# Patient Record
Sex: Female | Born: 1950 | Race: White | Hispanic: No | Marital: Married | Smoking: Former smoker
Health system: Southern US, Community
[De-identification: ages and names within clinical notes are randomized; demographics above are authoritative.]

## PROBLEM LIST (undated history)

## (undated) DIAGNOSIS — N189 Chronic kidney disease, unspecified: Secondary | ICD-10-CM

## (undated) DIAGNOSIS — M8580 Other specified disorders of bone density and structure, unspecified site: Secondary | ICD-10-CM

## (undated) DIAGNOSIS — E7800 Pure hypercholesterolemia, unspecified: Secondary | ICD-10-CM

## (undated) DIAGNOSIS — M1710 Unilateral primary osteoarthritis, unspecified knee: Secondary | ICD-10-CM

## (undated) DIAGNOSIS — L409 Psoriasis, unspecified: Secondary | ICD-10-CM

## (undated) DIAGNOSIS — F419 Anxiety disorder, unspecified: Principal | ICD-10-CM

## (undated) DIAGNOSIS — I10 Essential (primary) hypertension: Secondary | ICD-10-CM

## (undated) DIAGNOSIS — M1990 Unspecified osteoarthritis, unspecified site: Secondary | ICD-10-CM

## (undated) HISTORY — DX: Unilateral primary osteoarthritis, unspecified knee: M17.10

## (undated) HISTORY — DX: Essential (primary) hypertension: I10

## (undated) HISTORY — DX: Other specified disorders of bone density and structure, unspecified site: M85.80

## (undated) HISTORY — DX: Unspecified osteoarthritis, unspecified site: M19.90

## (undated) HISTORY — DX: Chronic kidney disease, unspecified: N18.9

## (undated) HISTORY — DX: Anxiety disorder, unspecified: F41.9

## (undated) HISTORY — DX: Pure hypercholesterolemia, unspecified: E78.00

## (undated) HISTORY — DX: Psoriasis, unspecified: L40.9

---

## 1983-03-11 HISTORY — PX: BREAST SURGERY: SHX581

## 1984-03-10 HISTORY — PX: BREAST EXCISIONAL BIOPSY: SUR124

## 1991-03-11 HISTORY — PX: TUBAL LIGATION: SHX77

## 1997-07-07 ENCOUNTER — Other Ambulatory Visit: Admission: RE | Admit: 1997-07-07 | Discharge: 1997-07-07 | Payer: Self-pay | Admitting: Obstetrics and Gynecology

## 1998-05-23 ENCOUNTER — Other Ambulatory Visit: Admission: RE | Admit: 1998-05-23 | Discharge: 1998-05-23 | Payer: Self-pay | Admitting: Internal Medicine

## 1999-04-18 ENCOUNTER — Encounter: Payer: Self-pay | Admitting: Internal Medicine

## 1999-04-18 ENCOUNTER — Encounter: Admission: RE | Admit: 1999-04-18 | Discharge: 1999-04-18 | Payer: Self-pay | Admitting: Internal Medicine

## 1999-12-06 ENCOUNTER — Other Ambulatory Visit: Admission: RE | Admit: 1999-12-06 | Discharge: 1999-12-06 | Payer: Self-pay | Admitting: Obstetrics and Gynecology

## 2000-11-17 ENCOUNTER — Encounter: Payer: Self-pay | Admitting: Internal Medicine

## 2000-11-17 ENCOUNTER — Encounter: Admission: RE | Admit: 2000-11-17 | Discharge: 2000-11-17 | Payer: Self-pay | Admitting: Internal Medicine

## 2000-12-08 ENCOUNTER — Other Ambulatory Visit: Admission: RE | Admit: 2000-12-08 | Discharge: 2000-12-08 | Payer: Self-pay | Admitting: Obstetrics and Gynecology

## 2002-02-01 ENCOUNTER — Encounter: Admission: RE | Admit: 2002-02-01 | Discharge: 2002-02-01 | Payer: Self-pay | Admitting: Family Medicine

## 2002-02-01 ENCOUNTER — Encounter: Payer: Self-pay | Admitting: Family Medicine

## 2002-03-10 HISTORY — PX: LEG SURGERY: SHX1003

## 2002-07-26 ENCOUNTER — Inpatient Hospital Stay (HOSPITAL_COMMUNITY): Admission: AD | Admit: 2002-07-26 | Discharge: 2002-07-28 | Payer: Self-pay | Admitting: Emergency Medicine

## 2002-07-26 ENCOUNTER — Encounter: Payer: Self-pay | Admitting: Emergency Medicine

## 2002-07-26 ENCOUNTER — Encounter: Payer: Self-pay | Admitting: Orthopedic Surgery

## 2002-08-31 ENCOUNTER — Ambulatory Visit (HOSPITAL_COMMUNITY): Admission: RE | Admit: 2002-08-31 | Discharge: 2002-08-31 | Payer: Self-pay | Admitting: Orthopedic Surgery

## 2003-04-04 ENCOUNTER — Other Ambulatory Visit: Admission: RE | Admit: 2003-04-04 | Discharge: 2003-04-04 | Payer: Self-pay | Admitting: Family Medicine

## 2003-05-09 ENCOUNTER — Encounter: Admission: RE | Admit: 2003-05-09 | Discharge: 2003-05-09 | Payer: Self-pay | Admitting: Family Medicine

## 2003-07-14 ENCOUNTER — Ambulatory Visit (HOSPITAL_COMMUNITY): Admission: RE | Admit: 2003-07-14 | Discharge: 2003-07-14 | Payer: Self-pay | Admitting: Orthopedic Surgery

## 2003-07-14 ENCOUNTER — Ambulatory Visit (HOSPITAL_BASED_OUTPATIENT_CLINIC_OR_DEPARTMENT_OTHER): Admission: RE | Admit: 2003-07-14 | Discharge: 2003-07-14 | Payer: Self-pay | Admitting: Orthopedic Surgery

## 2003-11-27 ENCOUNTER — Ambulatory Visit (HOSPITAL_BASED_OUTPATIENT_CLINIC_OR_DEPARTMENT_OTHER): Admission: RE | Admit: 2003-11-27 | Discharge: 2003-11-27 | Payer: Self-pay | Admitting: Orthopedic Surgery

## 2004-05-01 ENCOUNTER — Other Ambulatory Visit: Admission: RE | Admit: 2004-05-01 | Discharge: 2004-05-01 | Payer: Self-pay | Admitting: Family Medicine

## 2004-07-23 ENCOUNTER — Encounter: Admission: RE | Admit: 2004-07-23 | Discharge: 2004-07-23 | Payer: Self-pay | Admitting: Family Medicine

## 2005-05-09 ENCOUNTER — Other Ambulatory Visit: Admission: RE | Admit: 2005-05-09 | Discharge: 2005-05-09 | Payer: Self-pay | Admitting: Family Medicine

## 2005-08-19 ENCOUNTER — Encounter: Admission: RE | Admit: 2005-08-19 | Discharge: 2005-08-19 | Payer: Self-pay | Admitting: Family Medicine

## 2006-06-23 ENCOUNTER — Other Ambulatory Visit: Admission: RE | Admit: 2006-06-23 | Discharge: 2006-06-23 | Payer: Self-pay | Admitting: Family Medicine

## 2006-10-29 ENCOUNTER — Encounter: Admission: RE | Admit: 2006-10-29 | Discharge: 2006-10-29 | Payer: Self-pay | Admitting: Family Medicine

## 2007-06-29 ENCOUNTER — Other Ambulatory Visit: Admission: RE | Admit: 2007-06-29 | Discharge: 2007-06-29 | Payer: Self-pay | Admitting: Family Medicine

## 2007-10-29 ENCOUNTER — Ambulatory Visit (HOSPITAL_COMMUNITY): Admission: RE | Admit: 2007-10-29 | Discharge: 2007-10-30 | Payer: Self-pay | Admitting: Orthopedic Surgery

## 2007-11-30 ENCOUNTER — Encounter: Admission: RE | Admit: 2007-11-30 | Discharge: 2007-11-30 | Payer: Self-pay | Admitting: Family Medicine

## 2008-07-11 ENCOUNTER — Other Ambulatory Visit: Admission: RE | Admit: 2008-07-11 | Discharge: 2008-07-11 | Payer: Self-pay | Admitting: Family Medicine

## 2008-11-30 ENCOUNTER — Encounter: Admission: RE | Admit: 2008-11-30 | Discharge: 2008-11-30 | Payer: Self-pay | Admitting: Family Medicine

## 2009-07-13 ENCOUNTER — Other Ambulatory Visit: Admission: RE | Admit: 2009-07-13 | Discharge: 2009-07-13 | Payer: Self-pay | Admitting: Family Medicine

## 2009-12-03 ENCOUNTER — Encounter: Admission: RE | Admit: 2009-12-03 | Discharge: 2009-12-03 | Payer: Self-pay | Admitting: Family Medicine

## 2010-04-26 IMAGING — RF DG TIBIA/FIBULA 2V*L*
1 series · 3 of 3 positions shown · non-contrast
Comparison: None available.

CLINICAL DATA: Removal of hardware from the left tibia.

LEFT TIBIA AND FIBULA - 2 VIEW

[Series 1: run · 3 of 3 slices shown]
[im 1/3]
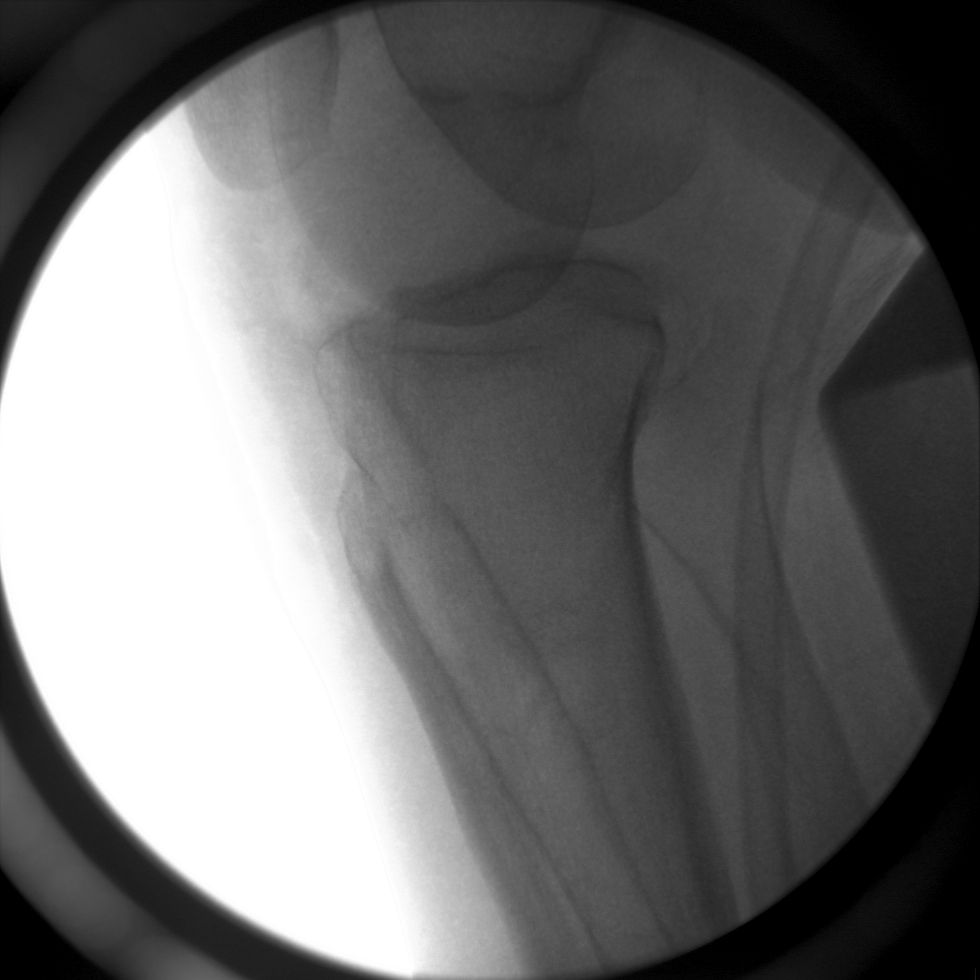
[im 2/3]
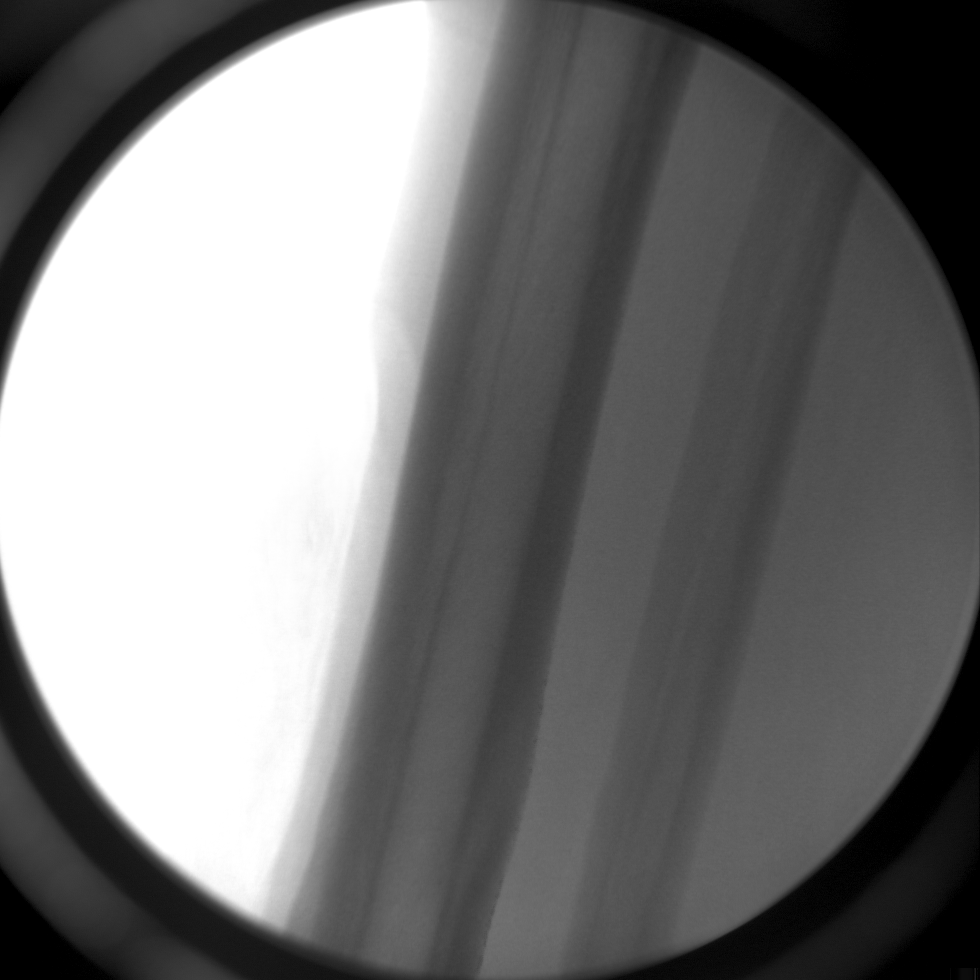
[im 3/3]
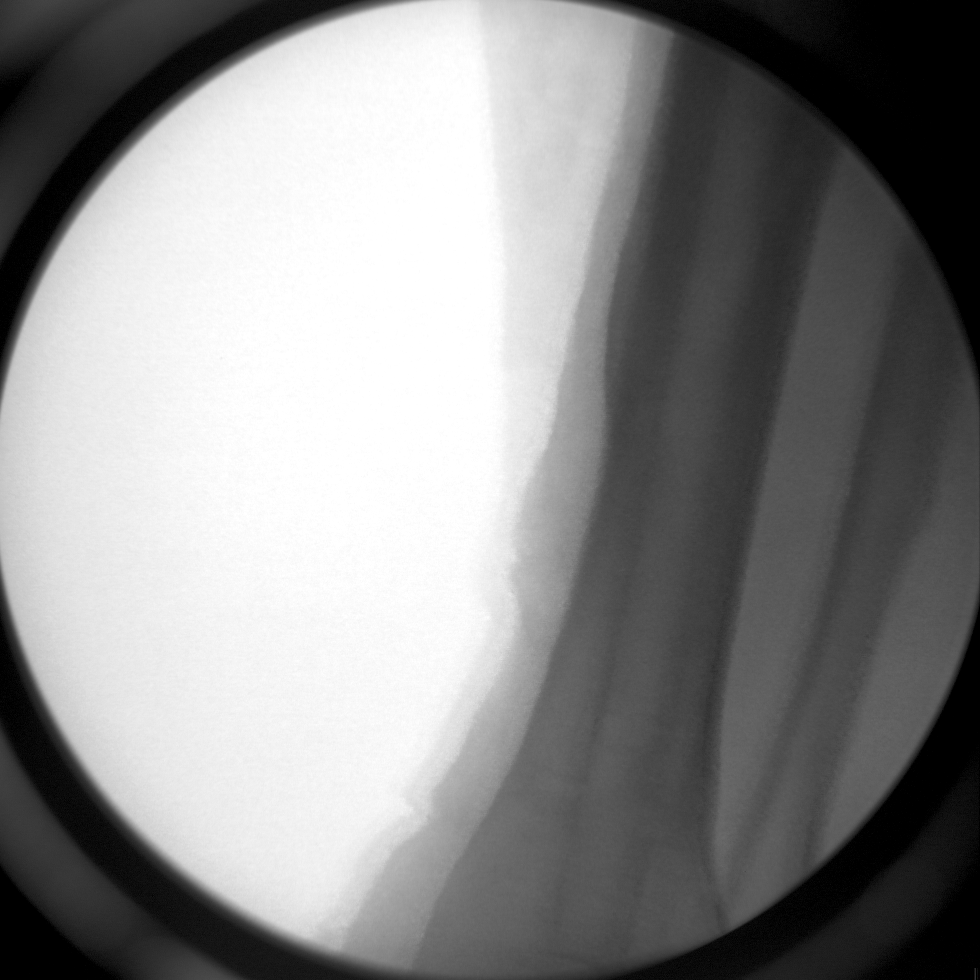

[3 of 3 positions shown; findings below may reference images not displayed]

FINDINGS: We are provided with three fluoroscopic intraoperative
spot views of the left tibia.  Tract from IM nail is noted.  No
retained hardware.  No fracture.
IMPRESSION: Status post removal of IM nail in the tibia.  No complicating
features.

## 2010-06-17 ENCOUNTER — Other Ambulatory Visit: Payer: Self-pay | Admitting: Family Medicine

## 2010-06-17 DIAGNOSIS — R1011 Right upper quadrant pain: Secondary | ICD-10-CM

## 2010-06-20 ENCOUNTER — Ambulatory Visit
Admission: RE | Admit: 2010-06-20 | Discharge: 2010-06-20 | Disposition: A | Discharge status: Home / Self Care | Payer: Federal, State, Local not specified - PPO | Source: Ambulatory Visit | Attending: Family Medicine | Admitting: Family Medicine

## 2010-06-20 DIAGNOSIS — R1011 Right upper quadrant pain: Secondary | ICD-10-CM

## 2010-06-24 ENCOUNTER — Other Ambulatory Visit: Payer: Self-pay | Admitting: Gastroenterology

## 2010-07-23 NOTE — Op Note (Signed)
NAMEMARITHZA, Morgan Jensen                ACCOUNT NO.:  [id number]   MEDICAL RECORD NO.:  [id number]          PATIENT TYPE:  AMB   LOCATION:  SDS                          FACILITY:  [facility]   PHYSICIAN:  Dyke Brackett, M.D.    DATE OF BIRTH:  1950/04/14   DATE OF PROCEDURE:  10/29/2007  DATE OF DISCHARGE:                               OPERATIVE REPORT   INDICATIONS:  This is a 60 year old with IM rod done many years.  It had  gone uneventful healing for severe open fracture, persistent achiness to  the leg biopsy.  I advised to her that this may or may not be due to the  rod.  She understood that this may or may not help her pain, but there  is no evidence of any other problem.  I thought this could be  accomplished as an outpatient.   PREOPERATIVE DIAGNOSIS:  Retained painful hardware of left leg.   POSTOPERATIVE DIAGNOSIS:  Retained painful hardware of left leg.   OPERATION:  Removal of IM rod, left leg.   SURGEON:  Dyke Brackett, MD   ANESTHESIA:  General.   TOURNIQUET TIME:  Approximately 30 minutes.   DESCRIPTION OF PROCEDURE:  After sterile prep and drape, exsanguination,  and tourniquet placed at 350, the old skin incision was entered.  Patellar tendon was carefully protected and retracted.  Using a guide  pin, the canal around rod was entered and then the in-cap on the rod  removed.  After that, a conical bolt was attached for extraction of the  rod which was removed with a slap hammer.  X-rays confirmed the tibia  was intact after rod removal.  The wound was irrigated and closed with  0, 2-0 Vicryl, and skin clips, Marcaine without epinephrine, light  compressive sterile dressing, and taken to recovery room stable  condition.      Dyke Brackett, M.D.  Electronically Signed     WDC/MEDQ  D:  10/29/2007  T:  10/30/2007  Job:  [id number]

## 2010-07-26 NOTE — Op Note (Signed)
Morgan Jensen, Morgan Jensen                          ACCOUNT NO.:  [id number]   MEDICAL RECORD NO.:  [id number]                   PATIENT TYPE:  AMB   LOCATION:  DSC                                  FACILITY:  [facility]   PHYSICIAN:  Robert A. Thurston Hole, M.D.              DATE OF BIRTH:  1950-11-26   DATE OF PROCEDURE:  11/27/2003  DATE OF DISCHARGE:                                 OPERATIVE REPORT   PREOPERATIVE DIAGNOSIS:  Left carpal tunnel syndrome.   POSTOPERATIVE DIAGNOSIS:  Left carpal tunnel syndrome.   PROCEDURE:  Left carpal tunnel release.   SURGEON:  Elana Alm. Thurston Hole, M.D.   ASSISTANT:  Cecil Cranker, P.A.   ANESTHESIA:  General.   OPERATIVE TIME:  Was 30 minutes.   COMPLICATIONS:  None.   INDICATIONS FOR PROCEDURE:  The patient is a 60 year old woman who has had  longstanding bilateral carpal tunnel syndrome, left worse than right,  documented by EMG and NCV's, who has failed conservative care.  She is now  to undergo a carpal tunnel release.   DESCRIPTION OF PROCEDURE:  The patient is brought to the operating room on  November 27, 2003, and placed on the operating room table in the supine  position.  After an adequate level of general anesthesia was obtained, her  left hand was prepped using sterile Duraprep and draped using a sterile  technique.  The arm and hand were exsanguinated and a forearm tourniquet  elevated to 250 mmHg.  Initially through a 4 cm palmar incision the initial  exposure was made.  The underlying subcutaneous tissues were incised in line  with the skin incision.  The transverse carpal ligament was exposed at the  level of the wrist flexion crease, carefully protecting the underlying  median nerve.  The entire transverse carpal tunnel ligament was released  distally to the level of the superficial palmar arch, carefully protecting  this, and released proximally approximately three inches proximal to the  wrist flexion crease, carefully  protecting the palmar cutaneous branch of  the median nerve.  The median nerve itself was found to be significantly  flattened and compressed.  No other pathology was noted.  It was felt that a  complete release had been carried out.  The wound was irrigated and closed  using interrupted #4-0 nylon mattress sutures, and injected with 0.25%  Marcaine.  Sterile dressings were applied.  The tourniquet was released.  The patient was awakened and taken to the recovery room in stable condition.   FOLLOWUP:  The patient will be followed as an outpatient on Darvocet for  pain and Naprosyn.  I will see her back in the office in one week for a  wound check and for followup.      RAW/MEDQ  D:  11/27/2003  T:  11/28/2003  Job:  [id number]

## 2010-07-26 NOTE — Discharge Summary (Signed)
NAMELATORYA, BAUTCH                          ACCOUNT NO.:  [id number]   MEDICAL RECORD NO.:  [id number]                   PATIENT TYPE:  INP   LOCATION:  5033                                 FACILITY:  [facility]   PHYSICIAN:  Dyke Brackett, M.D.                 DATE OF BIRTH:  19-Jul-1950   DATE OF ADMISSION:  07/26/2002  DATE OF DISCHARGE:  07/28/2002                                 DISCHARGE SUMMARY   ADMISSION DIAGNOSIS:  1. Open left leg tibia/fibula fracture.  2. Gastroesophageal reflux disease.  3. Anxiety.   DISCHARGE DIAGNOSES:  1. Irrigation and drainage and intramedullary nailing of open left     tibia/fibula fracture.  2. Gastroesophageal reflux disease.  3. Anxiety.   HISTORY OF PRESENT ILLNESS:  The patient is a 60 year old white female who  fell while ice skating on the date of admission.  The patient twisted the  left ankle.  The patient had significant pain and deformity and noted the  bone sticking out of her skin.  The patient was brought to the emergency  room for further evaluation.  She was found to have an open tibia/fibula  fracture of the left leg with distal tibial protruding through the skin.  The distal foot was neurologically, motor and vascularly intact.   ALLERGIES:  No known drug allergies.   CURRENT MEDICATIONS:  1. Paxil.  2. Protonix.   SURGICAL PROCEDURE:  On Jul 26, 2002, the patient was taken to the operating  room by Dr. Frederico Hamman assisted by Arlyn Leak, P.A.-C.  Under general  anesthesia, the patient underwent an open reduction and internal fixation  with interlocking nail and a transection of the butterfly foot fragment of a  posterior tibial fracture.  The wound was debrided and irrigated with  copious amounts of fluid.  The IM nail was a 9 mm 30 cm in length.  The  patient tolerated the procedure well.  There were no complications.  Then  the patient was placed in a posterior splint and transferred to the recovery  room and  then the orthopedic floor in good condition.   CONSULTATIONS:  The following routine consults were requested:  Physical  therapy.   HOSPITAL COURSE:  On Jul 26, 2002, the patient was admitted to [facility] under the care of Dr. Frederico Hamman through the emergency room.  The patient was taken to the operating room where an I&D and IM nailing of  her left open tibia/fibula fracture was performed.  The patient tolerated  the procedure well.  One small Penrose drain was left in the anterior  lateral tibial compartment.  There were no complications, and the patient  was transferred to the recovery room and then to the orthopedic floor in  good condition.   The patient then incurred a total of two days postoperative care on the  orthopedic floor in which  the patient had no significant untoward events.  Her vital signs remained stable.  She remained afebrile.  Her left lower  extremity remained neuromotor and vascularly intact with a well fitting  posterior splint.  The patient was initially managed with IV PCA Dilaudid  but was transitioned over to p.o. Percocet without any problems.  She worked  well with physical therapy and she was discharged to home on postoperative  day #2 with just some slight erythema around the incision and on Keflex for  7 days prophylactically.  The patient was recommended for followup the  following Monday and she was discharged in improved condition.   LABORATORY DATA:  EKG on admission was normal sinus rhythm with an  occasional premature ventricular complex at 70 beats per minute.   Left tibia/fibula x-ray shows fracture of the distal tibial diaphysis with  extension of the fracture to the distal tibial articular surface with a  comminuted fracture of the fibula.   Admission chest x-ray shows no acute pulmonary process.   CBC on Jul 27, 2002, shows WBC of 7.1, hemoglobin 10.8, hematocrit 32.3,  platelets 195,000.   Routine chemistries on Jul 26, 2002, sodium 138, potassium 4.2, glucose 106,  BUN 15, creatinine 1.0.  Wound tissue cultures were negative for any growth.   MEDICATIONS DISPENSED FROM ORTHOPEDIC FLOOR:  1. Phenergan 25 mg p.o. q.6h. p.r.n.  2. Tetanus diphtheria toxoid one injection.  3. Benadryl 25 mg p.o. q.6h. p.r.n. itching.  4. Gentamicin 120 mg IV x2 doses.  5. Tylenol 650 mg p.o. q.4h. p.r.n.  6. Protonix 40 mg p.o. every day  7. Percocet one to two tablets every four to six hours p.r.n.  8. Toradol 30 mg IV q.8h. for a total of three doses.  9. Paxil 20 mg p.o. every day   DISCHARGE MEDICATIONS:  1. Medications:  The patient is to resume her routine home medications.  2. Percocet 5 mg one or two tablets every four to six hours for pain if     needed.  3. Keflex 500 mg one tablet every 8 hours for five day.  4. Robaxin 500 mg one tablet every 8 hours for muscle spasms if needed.   ACTIVITY:  As tolerated.  The patient may touch down with the left foot for  balance with the use of crutches.   DIET:  No restrictions.   WOUND CARE:  Keep splint dry.   SPECIAL INSTRUCTIONS:  If any questions or concerns about infections, the  patient is to call Dr. Candise Bowens office.    FOLLOW UP:  The patient is to call [phone] for a followup appointment with  Dr. Madelon Lips on the following Monday.   DISCHARGE CONDITION:  The patient's condition on discharge to home is listed  as improved.     Jamelle Rushing, Arnetha Courser, M.D.    RWK/MEDQ  D:  08/31/2002  T:  09/01/2002  Job:  [id number]

## 2010-07-26 NOTE — Op Note (Signed)
NAME:  CRYSTALMARIE, YASIN                          ACCOUNT NO.:  [id number]   MEDICAL RECORD NO.:  [id number]                   PATIENT TYPE:  AMB   LOCATION:  DSC                                  FACILITY:  [facility]   PHYSICIAN:  Thera Flake., M.D.             DATE OF BIRTH:  10-10-1950   DATE OF PROCEDURE:  07/14/2003  DATE OF DISCHARGE:                                 OPERATIVE REPORT   PREOPERATIVE DIAGNOSIS:  Retained hardware, left leg.   POSTOPERATIVE DIAGNOSIS:  Retained hardware, left leg.   OPERATION:  Removal of proximal and distal interlocking screws (x4) of left  leg.   SURGEON:  Dyke Brackett, M.D.   TOURNIQUET TIME:  Approximately 20 minutes.   INDICATIONS:  A 60 year old with painful retained hardware, with tibial  screws being prominent on the left leg tibial rodding, approximately a year  out.  She is advised that screw removal can be accomplished.  I told her  that rod removal would be more involved and did not see a compelling reason  that she had to have the rod removed.  I felt this could be accomplished as  an outpatient.   DESCRIPTION OF PROCEDURE:  Sterile prep and drape, exsanguination of the  leg, inflation to 350.  All the old incisions were used proximally and  distally to identify the entry point for the screw.  The screws were removed  without difficulty, copiously irrigated out the incisions.  Closure was  effected with interrupted nylon, Marcaine with epinephrine infiltrated in  the skin.  A lightly compressive sterile dressing applied.  Taken to the  recovery room in stable condition.                                               Thera Flake., M.D.    WDC/MEDQ  D:  07/14/2003  T:  07/14/2003  Job:  [id number]

## 2010-07-26 NOTE — Op Note (Signed)
   NAME:  Morgan Jensen, Morgan Jensen                          ACCOUNT NO.:  [id number]   MEDICAL RECORD NO.:  [id number]                   PATIENT TYPE:  INP   LOCATION:  5033                                 FACILITY:  [facility]   PHYSICIAN:  Thera Flake., M.D.             DATE OF BIRTH:  Jun 19, 1950   DATE OF PROCEDURE:  07/26/2002  DATE OF DISCHARGE:                                 OPERATIVE REPORT   PREOPERATIVE DIAGNOSIS:  Severely displaced distal tibia fracture with  tibial shaft fracture.   POSTOPERATIVE DIAGNOSIS:  Severely displaced distal tibia fracture with  tibial shaft fracture.   PROCEDURES:  1. Open reduction and internal fixation with interlocking nail.  2. Transection of butterfly flip fragment of posterior tibia.   SURGEON:  Dyke Brackett, M.D.   ANESTHESIA:  General.   TOURNIQUET TIME:  Approximately 60 minutes.   DESCRIPTION OF PROCEDURE:  Sterile prep and drape.  Provisional reduction  and irrigation with over 3000 mL of pulsatile lavage fluid.  She has about a  2 cm very clean laceration, which is enlarged to about 4 cm to allow I&D.  No gross contamination noted.  Provisional reduction carried out, followed  by insertion of a drill bit and a guide pin through a medial biased incision  medial to the patellar tendon.  A guide pin was next placed, a Gonia-tipped  guide pin, across the fracture site, progressively reamed up to a 10 mm size  to accept a 9 mm nail, proximally reamed to 11.5 mm to accept the proximal  anatomy of the nail.  Measured provisionally to be about a 30 cm length  nail.  A 30 x 9 cm nail was inserted.  Proximal interlock with two screws,  distal interlock with two screws.  Additional fixation was obtained for an  18 front-to-back screw to transfix a separate butterfly fragment that  extended down to the joint, but there was no significant displacement of  this fractured distal tibia, but it was transfixed with a screw, followed by  a distal  interlock screw as well.  Basically an anatomic reduction obtained.  Closure of the wound over a drain on the open wound, staples on the skin,  and Marcaine without epinephrine infiltrated in the skin.  A lightly  compressive sterile dressing and posterior splint applied.  Taken to the  recovery room in stable condition.                                               Thera Flake., M.D.    WDC/MEDQ  D:  07/26/2002  T:  07/27/2002  Job:  [id number]

## 2010-10-09 ENCOUNTER — Ambulatory Visit (HOSPITAL_COMMUNITY)
Admission: RE | Admit: 2010-10-09 | Discharge: 2010-10-09 | Disposition: A | Discharge status: Home / Self Care | Payer: Federal, State, Local not specified - PPO | Source: Ambulatory Visit | Attending: Interventional Radiology | Admitting: Interventional Radiology

## 2010-10-09 DIAGNOSIS — M79609 Pain in unspecified limb: Secondary | ICD-10-CM | POA: Insufficient documentation

## 2010-10-09 DIAGNOSIS — M7989 Other specified soft tissue disorders: Secondary | ICD-10-CM | POA: Insufficient documentation

## 2010-12-04 ENCOUNTER — Other Ambulatory Visit: Payer: Self-pay | Admitting: Family Medicine

## 2010-12-04 DIAGNOSIS — Z1231 Encounter for screening mammogram for malignant neoplasm of breast: Secondary | ICD-10-CM

## 2010-12-17 ENCOUNTER — Ambulatory Visit: Payer: Federal, State, Local not specified - PPO

## 2011-01-23 ENCOUNTER — Ambulatory Visit: Payer: Federal, State, Local not specified - PPO

## 2011-01-27 ENCOUNTER — Ambulatory Visit
Admission: RE | Admit: 2011-01-27 | Discharge: 2011-01-27 | Disposition: A | Discharge status: Home / Self Care | Payer: Federal, State, Local not specified - PPO | Source: Ambulatory Visit | Attending: Family Medicine | Admitting: Family Medicine

## 2011-01-27 DIAGNOSIS — Z1231 Encounter for screening mammogram for malignant neoplasm of breast: Secondary | ICD-10-CM

## 2011-08-26 ENCOUNTER — Other Ambulatory Visit: Payer: Self-pay | Admitting: Family Medicine

## 2011-08-26 ENCOUNTER — Ambulatory Visit
Admission: RE | Admit: 2011-08-26 | Discharge: 2011-08-26 | Disposition: A | Discharge status: Home / Self Care | Payer: Federal, State, Local not specified - PPO | Source: Ambulatory Visit | Attending: Family Medicine | Admitting: Family Medicine

## 2011-08-26 DIAGNOSIS — R05 Cough: Secondary | ICD-10-CM

## 2012-01-12 ENCOUNTER — Other Ambulatory Visit: Payer: Self-pay | Admitting: Family Medicine

## 2012-01-12 DIAGNOSIS — Z1231 Encounter for screening mammogram for malignant neoplasm of breast: Secondary | ICD-10-CM

## 2012-02-17 ENCOUNTER — Ambulatory Visit
Admission: RE | Admit: 2012-02-17 | Discharge: 2012-02-17 | Disposition: A | Discharge status: Home / Self Care | Payer: Federal, State, Local not specified - PPO | Source: Ambulatory Visit | Attending: Family Medicine | Admitting: Family Medicine

## 2012-02-17 DIAGNOSIS — Z1231 Encounter for screening mammogram for malignant neoplasm of breast: Secondary | ICD-10-CM

## 2012-08-06 ENCOUNTER — Other Ambulatory Visit: Payer: Self-pay | Admitting: Family Medicine

## 2012-08-06 ENCOUNTER — Other Ambulatory Visit (HOSPITAL_COMMUNITY)
Admission: RE | Admit: 2012-08-06 | Discharge: 2012-08-06 | Disposition: A | Discharge status: Home / Self Care | Payer: Federal, State, Local not specified - PPO | Source: Ambulatory Visit | Attending: Family Medicine | Admitting: Family Medicine

## 2012-08-06 DIAGNOSIS — Z124 Encounter for screening for malignant neoplasm of cervix: Secondary | ICD-10-CM | POA: Insufficient documentation

## 2012-12-29 ENCOUNTER — Telehealth: Payer: Self-pay

## 2012-12-29 NOTE — Telephone Encounter (Signed)
Error

## 2013-01-21 ENCOUNTER — Other Ambulatory Visit: Payer: Self-pay

## 2013-01-21 DIAGNOSIS — Z1231 Encounter for screening mammogram for malignant neoplasm of breast: Secondary | ICD-10-CM

## 2013-02-17 ENCOUNTER — Ambulatory Visit: Payer: Federal, State, Local not specified - PPO

## 2013-02-28 ENCOUNTER — Ambulatory Visit: Payer: Federal, State, Local not specified - PPO

## 2013-05-16 ENCOUNTER — Other Ambulatory Visit: Payer: Self-pay

## 2013-05-16 DIAGNOSIS — Z1231 Encounter for screening mammogram for malignant neoplasm of breast: Secondary | ICD-10-CM

## 2013-05-31 ENCOUNTER — Ambulatory Visit
Admission: RE | Admit: 2013-05-31 | Discharge: 2013-05-31 | Disposition: A | Discharge status: Home / Self Care | Payer: Federal, State, Local not specified - PPO | Source: Ambulatory Visit

## 2013-05-31 DIAGNOSIS — Z1231 Encounter for screening mammogram for malignant neoplasm of breast: Secondary | ICD-10-CM

## 2014-03-13 ENCOUNTER — Other Ambulatory Visit (HOSPITAL_COMMUNITY): Payer: Self-pay | Admitting: *Deleted

## 2014-03-14 ENCOUNTER — Encounter (HOSPITAL_COMMUNITY)
Admission: RE | Admit: 2014-03-14 | Discharge: 2014-03-14 | Disposition: A | Discharge status: Home / Self Care | Payer: Federal, State, Local not specified - PPO | Source: Ambulatory Visit | Attending: Rheumatology | Admitting: Rheumatology

## 2014-03-14 DIAGNOSIS — L4050 Arthropathic psoriasis, unspecified: Secondary | ICD-10-CM | POA: Insufficient documentation

## 2014-03-14 MED ORDER — SODIUM CHLORIDE 0.9 % IV SOLN
400.0000 mg | INTRAVENOUS | Status: DC
  Administered 2014-03-14: 400 mg via INTRAVENOUS
  Filled 2014-03-14: qty 40

## 2014-03-14 MED ORDER — SODIUM CHLORIDE 0.9 % IV SOLN
INTRAVENOUS | Status: DC
  Administered 2014-03-14: 10:00:00 via INTRAVENOUS

## 2014-04-25 ENCOUNTER — Encounter (HOSPITAL_COMMUNITY): Payer: Federal, State, Local not specified - PPO

## 2014-05-09 ENCOUNTER — Other Ambulatory Visit (HOSPITAL_COMMUNITY): Payer: Self-pay | Admitting: *Deleted

## 2014-05-10 ENCOUNTER — Encounter (HOSPITAL_COMMUNITY)
Admission: RE | Admit: 2014-05-10 | Discharge: 2014-05-10 | Disposition: A | Discharge status: Home / Self Care | Payer: Federal, State, Local not specified - PPO | Source: Ambulatory Visit | Attending: Rheumatology | Admitting: Rheumatology

## 2014-05-10 DIAGNOSIS — L4050 Arthropathic psoriasis, unspecified: Secondary | ICD-10-CM | POA: Diagnosis present

## 2014-05-10 MED ORDER — SODIUM CHLORIDE 0.9 % IV SOLN
400.0000 mg | INTRAVENOUS | Status: DC
  Administered 2014-05-10: 400 mg via INTRAVENOUS
  Filled 2014-05-10: qty 40

## 2014-05-10 MED ORDER — SODIUM CHLORIDE 0.9 % IV SOLN
INTRAVENOUS | Status: DC
  Administered 2014-05-10: 11:00:00 via INTRAVENOUS

## 2014-06-13 ENCOUNTER — Other Ambulatory Visit: Payer: Self-pay

## 2014-06-13 DIAGNOSIS — Z1231 Encounter for screening mammogram for malignant neoplasm of breast: Secondary | ICD-10-CM

## 2014-06-20 ENCOUNTER — Ambulatory Visit
Admission: RE | Admit: 2014-06-20 | Discharge: 2014-06-20 | Disposition: A | Discharge status: Home / Self Care | Payer: Federal, State, Local not specified - PPO | Source: Ambulatory Visit

## 2014-06-20 DIAGNOSIS — Z1231 Encounter for screening mammogram for malignant neoplasm of breast: Secondary | ICD-10-CM

## 2014-06-21 ENCOUNTER — Encounter (HOSPITAL_COMMUNITY): Payer: Federal, State, Local not specified - PPO

## 2014-06-22 ENCOUNTER — Other Ambulatory Visit (HOSPITAL_COMMUNITY): Payer: Self-pay | Admitting: *Deleted

## 2014-06-23 ENCOUNTER — Encounter (HOSPITAL_COMMUNITY)
Admission: RE | Admit: 2014-06-23 | Discharge: 2014-06-23 | Disposition: A | Discharge status: Home / Self Care | Payer: Federal, State, Local not specified - PPO | Source: Ambulatory Visit | Attending: Rheumatology | Admitting: Rheumatology

## 2014-06-23 DIAGNOSIS — L4050 Arthropathic psoriasis, unspecified: Secondary | ICD-10-CM | POA: Diagnosis present

## 2014-06-23 LAB — CBC
HCT: 37.6 % (ref 36.0–46.0)
Hemoglobin: 12.5 g/dL (ref 12.0–15.0)
MCH: 29.1 pg (ref 26.0–34.0)
MCHC: 33.2 g/dL (ref 30.0–36.0)
MCV: 87.4 fL (ref 78.0–100.0)
Platelets: 286 10*3/uL (ref 150–400)
RBC: 4.3 MIL/uL (ref 3.87–5.11)
RDW: 13.4 % (ref 11.5–15.5)
WBC: 7.3 10*3/uL (ref 4.0–10.5)

## 2014-06-23 LAB — COMPREHENSIVE METABOLIC PANEL
ALT: 16 U/L (ref 0–35)
AST: 24 U/L (ref 0–37)
Albumin: 3.5 g/dL (ref 3.5–5.2)
Alkaline Phosphatase: 77 U/L (ref 39–117)
Anion gap: 10 (ref 5–15)
BILIRUBIN TOTAL: 0.6 mg/dL (ref 0.3–1.2)
BUN: 15 mg/dL (ref 6–23)
CALCIUM: 8.9 mg/dL (ref 8.4–10.5)
CHLORIDE: 102 mmol/L (ref 96–112)
CO2: 24 mmol/L (ref 19–32)
Creatinine, Ser: 0.97 mg/dL (ref 0.50–1.10)
GFR calc Af Amer: 70 mL/min — ABNORMAL LOW (ref >90)
GFR calc non Af Amer: 60 mL/min — ABNORMAL LOW (ref >90)
Glucose, Bld: 99 mg/dL (ref 70–99)
Potassium: 4 mmol/L (ref 3.5–5.1)
SODIUM: 136 mmol/L (ref 135–145)
Total Protein: 6.8 g/dL (ref 6.0–8.3)

## 2014-06-23 MED ORDER — DIPHENHYDRAMINE HCL 25 MG PO CAPS: 50.0000 mg | ORAL_CAPSULE | Freq: Once | ORAL | Status: DC

## 2014-06-23 MED ORDER — SODIUM CHLORIDE 0.9 % IV SOLN
INTRAVENOUS | Status: DC
  Administered 2014-06-23: 08:00:00 via INTRAVENOUS

## 2014-06-23 MED ORDER — SODIUM CHLORIDE 0.9 % IV SOLN
400.0000 mg | INTRAVENOUS | Status: DC
  Administered 2014-06-23: 400 mg via INTRAVENOUS
  Filled 2014-06-23: qty 40

## 2014-06-23 MED ORDER — ACETAMINOPHEN 325 MG PO TABS: 650.0000 mg | ORAL_TABLET | Freq: Four times a day (QID) | ORAL | Status: DC | PRN

## 2014-08-03 ENCOUNTER — Other Ambulatory Visit (HOSPITAL_COMMUNITY): Payer: Self-pay | Admitting: *Deleted

## 2014-08-04 ENCOUNTER — Encounter (HOSPITAL_COMMUNITY)
Admission: RE | Admit: 2014-08-04 | Discharge: 2014-08-04 | Disposition: A | Discharge status: Home / Self Care | Payer: Federal, State, Local not specified - PPO | Source: Ambulatory Visit | Attending: Rheumatology | Admitting: Rheumatology

## 2014-08-04 DIAGNOSIS — L4050 Arthropathic psoriasis, unspecified: Secondary | ICD-10-CM | POA: Diagnosis present

## 2014-08-04 LAB — COMPREHENSIVE METABOLIC PANEL
ALT: 15 U/L (ref 14–54)
AST: 31 U/L (ref 15–41)
Albumin: 3.3 g/dL — ABNORMAL LOW (ref 3.5–5.0)
Alkaline Phosphatase: 80 U/L (ref 38–126)
Anion gap: 8 (ref 5–15)
BUN: 10 mg/dL (ref 6–20)
CO2: 23 mmol/L (ref 22–32)
CREATININE: 0.98 mg/dL (ref 0.44–1.00)
Calcium: 8.7 mg/dL — ABNORMAL LOW (ref 8.9–10.3)
Chloride: 105 mmol/L (ref 101–111)
GFR calc Af Amer: >60 mL/min (ref >60)
GFR, EST NON AFRICAN AMERICAN: 59 mL/min — AB (ref >60)
Glucose, Bld: 183 mg/dL — ABNORMAL HIGH (ref 65–99)
POTASSIUM: 3.9 mmol/L (ref 3.5–5.1)
SODIUM: 136 mmol/L (ref 135–145)
Total Bilirubin: 1 mg/dL (ref 0.3–1.2)
Total Protein: 6.2 g/dL — ABNORMAL LOW (ref 6.5–8.1)

## 2014-08-04 LAB — DIFFERENTIAL
Basophils Absolute: 0 10*3/uL (ref 0.0–0.1)
Basophils Relative: 0 % (ref 0–1)
Eosinophils Absolute: 0.2 10*3/uL (ref 0.0–0.7)
Eosinophils Relative: 3 % (ref 0–5)
LYMPHS PCT: 31 % (ref 12–46)
Lymphs Abs: 1.8 10*3/uL (ref 0.7–4.0)
MONO ABS: 0.5 10*3/uL (ref 0.1–1.0)
MONOS PCT: 8 % (ref 3–12)
NEUTROS ABS: 3.4 10*3/uL (ref 1.7–7.7)
NEUTROS PCT: 58 % (ref 43–77)

## 2014-08-04 LAB — CBC
HCT: 35.1 % — ABNORMAL LOW (ref 36.0–46.0)
Hemoglobin: 11.6 g/dL — ABNORMAL LOW (ref 12.0–15.0)
MCH: 28.9 pg (ref 26.0–34.0)
MCHC: 33 g/dL (ref 30.0–36.0)
MCV: 87.3 fL (ref 78.0–100.0)
Platelets: 278 10*3/uL (ref 150–400)
RBC: 4.02 MIL/uL (ref 3.87–5.11)
RDW: 13.7 % (ref 11.5–15.5)
WBC: 5.9 10*3/uL (ref 4.0–10.5)

## 2014-08-04 MED ORDER — SODIUM CHLORIDE 0.9 % IV SOLN
400.0000 mg | INTRAVENOUS | Status: DC
  Administered 2014-08-04: 400 mg via INTRAVENOUS
  Filled 2014-08-04: qty 40

## 2014-08-04 MED ORDER — ACETAMINOPHEN 325 MG PO TABS: 650.0000 mg | ORAL_TABLET | ORAL | Status: DC

## 2014-08-04 MED ORDER — DIPHENHYDRAMINE HCL 25 MG PO CAPS: 50.0000 mg | ORAL_CAPSULE | ORAL | Status: DC

## 2014-08-04 MED ORDER — SODIUM CHLORIDE 0.9 % IV SOLN
INTRAVENOUS | Status: DC
  Administered 2014-08-04: 11:00:00 via INTRAVENOUS

## 2014-09-04 ENCOUNTER — Other Ambulatory Visit: Payer: Self-pay

## 2014-09-14 ENCOUNTER — Other Ambulatory Visit (HOSPITAL_COMMUNITY): Payer: Self-pay | Admitting: *Deleted

## 2014-09-15 ENCOUNTER — Encounter (HOSPITAL_COMMUNITY)
Admission: RE | Admit: 2014-09-15 | Discharge: 2014-09-15 | Disposition: A | Discharge status: Home / Self Care | Payer: Federal, State, Local not specified - PPO | Source: Ambulatory Visit | Attending: Rheumatology | Admitting: Rheumatology

## 2014-09-15 DIAGNOSIS — L4050 Arthropathic psoriasis, unspecified: Secondary | ICD-10-CM | POA: Insufficient documentation

## 2014-09-15 LAB — CBC WITH DIFFERENTIAL/PLATELET
Basophils Absolute: 0 10*3/uL (ref 0.0–0.1)
Basophils Relative: 0 % (ref 0–1)
EOS ABS: 0.2 10*3/uL (ref 0.0–0.7)
Eosinophils Relative: 3 % (ref 0–5)
HCT: 39.2 % (ref 36.0–46.0)
HEMOGLOBIN: 13.1 g/dL (ref 12.0–15.0)
LYMPHS ABS: 1.7 10*3/uL (ref 0.7–4.0)
Lymphocytes Relative: 29 % (ref 12–46)
MCH: 29.5 pg (ref 26.0–34.0)
MCHC: 33.4 g/dL (ref 30.0–36.0)
MCV: 88.3 fL (ref 78.0–100.0)
Monocytes Absolute: 0.2 10*3/uL (ref 0.1–1.0)
Monocytes Relative: 4 % (ref 3–12)
NEUTROS ABS: 3.7 10*3/uL (ref 1.7–7.7)
NEUTROS PCT: 64 % (ref 43–77)
PLATELETS: 271 10*3/uL (ref 150–400)
RBC: 4.44 MIL/uL (ref 3.87–5.11)
RDW: 13.8 % (ref 11.5–15.5)
WBC: 5.8 10*3/uL (ref 4.0–10.5)

## 2014-09-15 LAB — COMPREHENSIVE METABOLIC PANEL
ALBUMIN: 3.7 g/dL (ref 3.5–5.0)
ALT: 22 U/L (ref 14–54)
ANION GAP: 9 (ref 5–15)
AST: 28 U/L (ref 15–41)
Alkaline Phosphatase: 77 U/L (ref 38–126)
BILIRUBIN TOTAL: 0.7 mg/dL (ref 0.3–1.2)
BUN: 11 mg/dL (ref 6–20)
CO2: 24 mmol/L (ref 22–32)
Calcium: 9 mg/dL (ref 8.9–10.3)
Chloride: 105 mmol/L (ref 101–111)
Creatinine, Ser: 1.03 mg/dL — ABNORMAL HIGH (ref 0.44–1.00)
GFR calc Af Amer: >60 mL/min (ref >60)
GFR calc non Af Amer: 56 mL/min — ABNORMAL LOW (ref >60)
Glucose, Bld: 161 mg/dL — ABNORMAL HIGH (ref 65–99)
Potassium: 3.9 mmol/L (ref 3.5–5.1)
SODIUM: 138 mmol/L (ref 135–145)
TOTAL PROTEIN: 7.2 g/dL (ref 6.5–8.1)

## 2014-09-15 MED ORDER — SODIUM CHLORIDE 0.9 % IV SOLN
400.0000 mg | INTRAVENOUS | Status: DC
  Administered 2014-09-15: 400 mg via INTRAVENOUS
  Filled 2014-09-15: qty 40

## 2014-09-15 MED ORDER — DIPHENHYDRAMINE HCL 25 MG PO CAPS: 25.0000 mg | ORAL_CAPSULE | ORAL | Status: DC

## 2014-09-15 MED ORDER — ACETAMINOPHEN 325 MG PO TABS: 650.0000 mg | ORAL_TABLET | ORAL | Status: DC

## 2014-09-15 MED ORDER — SODIUM CHLORIDE 0.9 % IV SOLN
INTRAVENOUS | Status: DC
  Administered 2014-09-15: 11:00:00 via INTRAVENOUS

## 2014-10-27 ENCOUNTER — Encounter (HOSPITAL_COMMUNITY)
Admission: RE | Admit: 2014-10-27 | Discharge: 2014-10-27 | Disposition: A | Discharge status: Home / Self Care | Payer: Federal, State, Local not specified - PPO | Source: Ambulatory Visit | Attending: Rheumatology | Admitting: Rheumatology

## 2014-10-27 DIAGNOSIS — L4050 Arthropathic psoriasis, unspecified: Secondary | ICD-10-CM | POA: Insufficient documentation

## 2014-10-27 LAB — CBC
HCT: 36.6 % (ref 36.0–46.0)
Hemoglobin: 12.6 g/dL (ref 12.0–15.0)
MCH: 30.3 pg (ref 26.0–34.0)
MCHC: 34.4 g/dL (ref 30.0–36.0)
MCV: 88 fL (ref 78.0–100.0)
PLATELETS: 255 10*3/uL (ref 150–400)
RBC: 4.16 MIL/uL (ref 3.87–5.11)
RDW: 13.1 % (ref 11.5–15.5)
WBC: 5.5 10*3/uL (ref 4.0–10.5)

## 2014-10-27 LAB — COMPREHENSIVE METABOLIC PANEL
ALK PHOS: 73 U/L (ref 38–126)
ALT: 18 U/L (ref 14–54)
AST: 27 U/L (ref 15–41)
Albumin: 3.5 g/dL (ref 3.5–5.0)
Anion gap: 7 (ref 5–15)
BUN: 13 mg/dL (ref 6–20)
CALCIUM: 8.8 mg/dL — AB (ref 8.9–10.3)
CO2: 25 mmol/L (ref 22–32)
Chloride: 103 mmol/L (ref 101–111)
Creatinine, Ser: 1.02 mg/dL — ABNORMAL HIGH (ref 0.44–1.00)
GFR calc Af Amer: >60 mL/min (ref >60)
GFR calc non Af Amer: 56 mL/min — ABNORMAL LOW (ref >60)
Glucose, Bld: 162 mg/dL — ABNORMAL HIGH (ref 65–99)
Potassium: 3.6 mmol/L (ref 3.5–5.1)
Sodium: 135 mmol/L (ref 135–145)
Total Bilirubin: 0.4 mg/dL (ref 0.3–1.2)
Total Protein: 7 g/dL (ref 6.5–8.1)

## 2014-10-27 LAB — DIFFERENTIAL
Basophils Absolute: 0 10*3/uL (ref 0.0–0.1)
Basophils Relative: 0 % (ref 0–1)
Eosinophils Absolute: 0.2 10*3/uL (ref 0.0–0.7)
Eosinophils Relative: 3 % (ref 0–5)
LYMPHS PCT: 30 % (ref 12–46)
Lymphs Abs: 1.7 10*3/uL (ref 0.7–4.0)
MONO ABS: 0.3 10*3/uL (ref 0.1–1.0)
Monocytes Relative: 6 % (ref 3–12)
Neutro Abs: 3.4 10*3/uL (ref 1.7–7.7)
Neutrophils Relative %: 61 % (ref 43–77)

## 2014-10-27 MED ORDER — ACETAMINOPHEN 325 MG PO TABS: 650.0000 mg | ORAL_TABLET | ORAL | Status: DC

## 2014-10-27 MED ORDER — SODIUM CHLORIDE 0.9 % IV SOLN
400.0000 mg | INTRAVENOUS | Status: DC
  Administered 2014-10-27: 400 mg via INTRAVENOUS
  Filled 2014-10-27: qty 40

## 2014-10-27 MED ORDER — DIPHENHYDRAMINE HCL 25 MG PO CAPS: 25.0000 mg | ORAL_CAPSULE | ORAL | Status: DC

## 2014-10-27 MED ORDER — SODIUM CHLORIDE 0.9 % IV SOLN
INTRAVENOUS | Status: DC
  Administered 2014-10-27: 250 mL via INTRAVENOUS

## 2014-12-08 ENCOUNTER — Encounter (HOSPITAL_COMMUNITY)
Admission: RE | Admit: 2014-12-08 | Discharge: 2014-12-08 | Disposition: A | Discharge status: Home / Self Care | Payer: Federal, State, Local not specified - PPO | Source: Ambulatory Visit | Attending: Rheumatology | Admitting: Rheumatology

## 2014-12-08 DIAGNOSIS — L4050 Arthropathic psoriasis, unspecified: Secondary | ICD-10-CM | POA: Diagnosis not present

## 2014-12-08 LAB — CBC WITH DIFFERENTIAL/PLATELET
BASOS ABS: 0 10*3/uL (ref 0.0–0.1)
Basophils Relative: 1 %
EOS ABS: 0.2 10*3/uL (ref 0.0–0.7)
EOS PCT: 3 %
HCT: 38.5 % (ref 36.0–46.0)
Hemoglobin: 12.7 g/dL (ref 12.0–15.0)
LYMPHS PCT: 42 %
Lymphs Abs: 2.1 10*3/uL (ref 0.7–4.0)
MCH: 29.2 pg (ref 26.0–34.0)
MCHC: 33 g/dL (ref 30.0–36.0)
MCV: 88.5 fL (ref 78.0–100.0)
MONO ABS: 0.3 10*3/uL (ref 0.1–1.0)
Monocytes Relative: 5 %
Neutro Abs: 2.4 10*3/uL (ref 1.7–7.7)
Neutrophils Relative %: 49 %
PLATELETS: 304 10*3/uL (ref 150–400)
RBC: 4.35 MIL/uL (ref 3.87–5.11)
RDW: 13.1 % (ref 11.5–15.5)
WBC: 5 10*3/uL (ref 4.0–10.5)

## 2014-12-08 LAB — COMPREHENSIVE METABOLIC PANEL
ALT: 15 U/L (ref 14–54)
AST: 26 U/L (ref 15–41)
Albumin: 3.6 g/dL (ref 3.5–5.0)
Alkaline Phosphatase: 88 U/L (ref 38–126)
Anion gap: 10 (ref 5–15)
BUN: 12 mg/dL (ref 6–20)
CHLORIDE: 106 mmol/L (ref 101–111)
CO2: 22 mmol/L (ref 22–32)
Calcium: 9.4 mg/dL (ref 8.9–10.3)
Creatinine, Ser: 1.09 mg/dL — ABNORMAL HIGH (ref 0.44–1.00)
GFR calc non Af Amer: 52 mL/min — ABNORMAL LOW (ref >60)
Glucose, Bld: 180 mg/dL — ABNORMAL HIGH (ref 65–99)
POTASSIUM: 4 mmol/L (ref 3.5–5.1)
Sodium: 138 mmol/L (ref 135–145)
Total Bilirubin: 0.6 mg/dL (ref 0.3–1.2)
Total Protein: 6.8 g/dL (ref 6.5–8.1)

## 2014-12-08 MED ORDER — SODIUM CHLORIDE 0.9 % IV SOLN: INTRAVENOUS | Status: DC

## 2014-12-08 MED ORDER — ACETAMINOPHEN 325 MG PO TABS: 650.0000 mg | ORAL_TABLET | ORAL | Status: DC

## 2014-12-08 MED ORDER — SODIUM CHLORIDE 0.9 % IV SOLN
400.0000 mg | INTRAVENOUS | Status: DC
  Administered 2014-12-08: 400 mg via INTRAVENOUS
  Filled 2014-12-08: qty 40

## 2014-12-08 MED ORDER — DIPHENHYDRAMINE HCL 25 MG PO CAPS: 25.0000 mg | ORAL_CAPSULE | ORAL | Status: DC

## 2015-01-18 ENCOUNTER — Other Ambulatory Visit (HOSPITAL_COMMUNITY): Payer: Self-pay

## 2015-01-19 ENCOUNTER — Encounter (HOSPITAL_COMMUNITY)
Admission: RE | Admit: 2015-01-19 | Discharge: 2015-01-19 | Disposition: A | Discharge status: Home / Self Care | Payer: Federal, State, Local not specified - PPO | Source: Ambulatory Visit | Attending: Rheumatology | Admitting: Rheumatology

## 2015-01-19 DIAGNOSIS — L4050 Arthropathic psoriasis, unspecified: Secondary | ICD-10-CM | POA: Insufficient documentation

## 2015-01-19 LAB — CBC WITH DIFFERENTIAL/PLATELET
BASOS ABS: 0 10*3/uL (ref 0.0–0.1)
BASOS PCT: 0 %
Eosinophils Absolute: 0.2 10*3/uL (ref 0.0–0.7)
Eosinophils Relative: 3 %
HCT: 35.4 % — ABNORMAL LOW (ref 36.0–46.0)
Hemoglobin: 11.9 g/dL — ABNORMAL LOW (ref 12.0–15.0)
LYMPHS PCT: 26 %
Lymphs Abs: 1.4 10*3/uL (ref 0.7–4.0)
MCH: 29 pg (ref 26.0–34.0)
MCHC: 33.6 g/dL (ref 30.0–36.0)
MCV: 86.3 fL (ref 78.0–100.0)
Monocytes Absolute: 0.3 10*3/uL (ref 0.1–1.0)
Monocytes Relative: 5 %
NEUTROS PCT: 66 %
Neutro Abs: 3.6 10*3/uL (ref 1.7–7.7)
PLATELETS: 277 10*3/uL (ref 150–400)
RBC: 4.1 MIL/uL (ref 3.87–5.11)
RDW: 13.3 % (ref 11.5–15.5)
WBC: 5.5 10*3/uL (ref 4.0–10.5)

## 2015-01-19 LAB — COMPREHENSIVE METABOLIC PANEL
ALBUMIN: 3.4 g/dL — AB (ref 3.5–5.0)
ALT: 15 U/L (ref 14–54)
ANION GAP: 8 (ref 5–15)
AST: 24 U/L (ref 15–41)
Alkaline Phosphatase: 85 U/L (ref 38–126)
BILIRUBIN TOTAL: 0.4 mg/dL (ref 0.3–1.2)
BUN: 6 mg/dL (ref 6–20)
CHLORIDE: 106 mmol/L (ref 101–111)
CO2: 24 mmol/L (ref 22–32)
Calcium: 8.8 mg/dL — ABNORMAL LOW (ref 8.9–10.3)
Creatinine, Ser: 0.92 mg/dL (ref 0.44–1.00)
GFR calc Af Amer: >60 mL/min (ref >60)
GLUCOSE: 136 mg/dL — AB (ref 65–99)
POTASSIUM: 3.6 mmol/L (ref 3.5–5.1)
Sodium: 138 mmol/L (ref 135–145)
Total Protein: 6.8 g/dL (ref 6.5–8.1)

## 2015-01-19 MED ORDER — ACETAMINOPHEN 325 MG PO TABS: 650.0000 mg | ORAL_TABLET | ORAL | Status: DC

## 2015-01-19 MED ORDER — SODIUM CHLORIDE 0.9 % IV SOLN
400.0000 mg | INTRAVENOUS | Status: DC
  Administered 2015-01-19: 400 mg via INTRAVENOUS
  Filled 2015-01-19: qty 40

## 2015-01-19 MED ORDER — SODIUM CHLORIDE 0.9 % IV SOLN
INTRAVENOUS | Status: DC
  Administered 2015-01-19: 10:00:00 via INTRAVENOUS

## 2015-01-19 MED ORDER — DIPHENHYDRAMINE HCL 25 MG PO CAPS: 25.0000 mg | ORAL_CAPSULE | ORAL | Status: DC

## 2015-02-13 ENCOUNTER — Other Ambulatory Visit: Payer: Self-pay | Admitting: Dermatology

## 2015-03-02 ENCOUNTER — Encounter (HOSPITAL_COMMUNITY)
Admission: RE | Admit: 2015-03-02 | Discharge: 2015-03-02 | Disposition: A | Discharge status: Home / Self Care | Payer: Federal, State, Local not specified - PPO | Source: Ambulatory Visit | Attending: Rheumatology | Admitting: Rheumatology

## 2015-03-02 DIAGNOSIS — L4050 Arthropathic psoriasis, unspecified: Secondary | ICD-10-CM | POA: Insufficient documentation

## 2015-03-02 LAB — COMPREHENSIVE METABOLIC PANEL
ALBUMIN: 3.5 g/dL (ref 3.5–5.0)
ALT: 14 U/L (ref 14–54)
ANION GAP: 9 (ref 5–15)
AST: 25 U/L (ref 15–41)
Alkaline Phosphatase: 90 U/L (ref 38–126)
BILIRUBIN TOTAL: 0.5 mg/dL (ref 0.3–1.2)
BUN: 8 mg/dL (ref 6–20)
CO2: 25 mmol/L (ref 22–32)
Calcium: 9.1 mg/dL (ref 8.9–10.3)
Chloride: 104 mmol/L (ref 101–111)
Creatinine, Ser: 0.98 mg/dL (ref 0.44–1.00)
GFR calc Af Amer: >60 mL/min (ref >60)
GFR calc non Af Amer: 59 mL/min — ABNORMAL LOW (ref >60)
GLUCOSE: 152 mg/dL — AB (ref 65–99)
POTASSIUM: 3.6 mmol/L (ref 3.5–5.1)
SODIUM: 138 mmol/L (ref 135–145)
TOTAL PROTEIN: 7.4 g/dL (ref 6.5–8.1)

## 2015-03-02 LAB — CBC WITH DIFFERENTIAL/PLATELET
BASOS ABS: 0 10*3/uL (ref 0.0–0.1)
BASOS PCT: 0 %
EOS ABS: 0.1 10*3/uL (ref 0.0–0.7)
Eosinophils Relative: 3 %
HEMATOCRIT: 37.4 % (ref 36.0–46.0)
HEMOGLOBIN: 12.1 g/dL (ref 12.0–15.0)
Lymphocytes Relative: 30 %
Lymphs Abs: 1.4 10*3/uL (ref 0.7–4.0)
MCH: 28.2 pg (ref 26.0–34.0)
MCHC: 32.4 g/dL (ref 30.0–36.0)
MCV: 87.2 fL (ref 78.0–100.0)
MONO ABS: 0.2 10*3/uL (ref 0.1–1.0)
Monocytes Relative: 5 %
NEUTROS ABS: 3 10*3/uL (ref 1.7–7.7)
NEUTROS PCT: 62 %
Platelets: 293 10*3/uL (ref 150–400)
RBC: 4.29 MIL/uL (ref 3.87–5.11)
RDW: 13.4 % (ref 11.5–15.5)
WBC: 4.8 10*3/uL (ref 4.0–10.5)

## 2015-03-02 MED ORDER — ACETAMINOPHEN 325 MG PO TABS: 650.0000 mg | ORAL_TABLET | ORAL | Status: DC

## 2015-03-02 MED ORDER — SODIUM CHLORIDE 0.9 % IV SOLN: INTRAVENOUS | Status: DC

## 2015-03-02 MED ORDER — SODIUM CHLORIDE 0.9 % IV SOLN
400.0000 mg | INTRAVENOUS | Status: DC
  Administered 2015-03-02: 400 mg via INTRAVENOUS
  Filled 2015-03-02: qty 40

## 2015-03-02 MED ORDER — DIPHENHYDRAMINE HCL 25 MG PO CAPS: 25.0000 mg | ORAL_CAPSULE | ORAL | Status: DC

## 2015-03-02 NOTE — Progress Notes (Signed)
Remicade clamp was closed. Restarted at appropriate rate of 88ml/hr. Will titrate every 15 minutes as per admin instructions. Patient aware.

## 2015-04-12 ENCOUNTER — Other Ambulatory Visit (HOSPITAL_COMMUNITY): Payer: Self-pay | Admitting: *Deleted

## 2015-04-13 ENCOUNTER — Encounter (HOSPITAL_COMMUNITY)
Admission: RE | Admit: 2015-04-13 | Discharge: 2015-04-13 | Disposition: A | Discharge status: Home / Self Care | Payer: Federal, State, Local not specified - PPO | Source: Ambulatory Visit | Attending: Rheumatology | Admitting: Rheumatology

## 2015-04-13 DIAGNOSIS — L4050 Arthropathic psoriasis, unspecified: Secondary | ICD-10-CM | POA: Insufficient documentation

## 2015-04-13 LAB — COMPREHENSIVE METABOLIC PANEL
ALBUMIN: 3.5 g/dL (ref 3.5–5.0)
ALT: 13 U/L — AB (ref 14–54)
AST: 20 U/L (ref 15–41)
Alkaline Phosphatase: 75 U/L (ref 38–126)
Anion gap: 12 (ref 5–15)
BUN: 13 mg/dL (ref 6–20)
CHLORIDE: 104 mmol/L (ref 101–111)
CO2: 24 mmol/L (ref 22–32)
CREATININE: 1.09 mg/dL — AB (ref 0.44–1.00)
Calcium: 9.3 mg/dL (ref 8.9–10.3)
GFR calc non Af Amer: 52 mL/min — ABNORMAL LOW (ref >60)
GLUCOSE: 192 mg/dL — AB (ref 65–99)
Potassium: 3.9 mmol/L (ref 3.5–5.1)
SODIUM: 140 mmol/L (ref 135–145)
Total Bilirubin: 0.4 mg/dL (ref 0.3–1.2)
Total Protein: 6.9 g/dL (ref 6.5–8.1)

## 2015-04-13 LAB — CBC WITH DIFFERENTIAL/PLATELET
BASOS ABS: 0 10*3/uL (ref 0.0–0.1)
BASOS PCT: 0 %
EOS ABS: 0.2 10*3/uL (ref 0.0–0.7)
EOS PCT: 4 %
HCT: 37.9 % (ref 36.0–46.0)
HEMOGLOBIN: 12.8 g/dL (ref 12.0–15.0)
Lymphocytes Relative: 37 %
Lymphs Abs: 2.1 10*3/uL (ref 0.7–4.0)
MCH: 29.4 pg (ref 26.0–34.0)
MCHC: 33.8 g/dL (ref 30.0–36.0)
MCV: 87.1 fL (ref 78.0–100.0)
Monocytes Absolute: 0.3 10*3/uL (ref 0.1–1.0)
Monocytes Relative: 5 %
NEUTROS PCT: 54 %
Neutro Abs: 3 10*3/uL (ref 1.7–7.7)
PLATELETS: 259 10*3/uL (ref 150–400)
RBC: 4.35 MIL/uL (ref 3.87–5.11)
RDW: 13.8 % (ref 11.5–15.5)
WBC: 5.7 10*3/uL (ref 4.0–10.5)

## 2015-04-13 MED ORDER — INFLIXIMAB 100 MG IV SOLR
400.0000 mg | INTRAVENOUS | Status: DC
  Administered 2015-04-13: 400 mg via INTRAVENOUS
  Filled 2015-04-13: qty 40

## 2015-04-13 MED ORDER — SODIUM CHLORIDE 0.9 % IV SOLN
INTRAVENOUS | Status: DC
Start: 2015-04-13 — End: 2015-04-14
  Administered 2015-04-13: 13:00:00 via INTRAVENOUS

## 2015-05-24 ENCOUNTER — Other Ambulatory Visit (HOSPITAL_COMMUNITY): Payer: Self-pay | Admitting: *Deleted

## 2015-05-25 ENCOUNTER — Ambulatory Visit (HOSPITAL_COMMUNITY)
Admission: RE | Admit: 2015-05-25 | Discharge: 2015-05-25 | Disposition: A | Discharge status: Home / Self Care | Payer: Federal, State, Local not specified - PPO | Source: Ambulatory Visit | Attending: Rheumatology | Admitting: Rheumatology

## 2015-05-25 DIAGNOSIS — Z79899 Other long term (current) drug therapy: Secondary | ICD-10-CM | POA: Diagnosis not present

## 2015-05-25 DIAGNOSIS — L4050 Arthropathic psoriasis, unspecified: Secondary | ICD-10-CM | POA: Diagnosis present

## 2015-05-25 LAB — COMPREHENSIVE METABOLIC PANEL
ALBUMIN: 3.3 g/dL — AB (ref 3.5–5.0)
ALT: 16 U/L (ref 14–54)
ANION GAP: 11 (ref 5–15)
AST: 22 U/L (ref 15–41)
Alkaline Phosphatase: 72 U/L (ref 38–126)
BILIRUBIN TOTAL: 0.4 mg/dL (ref 0.3–1.2)
BUN: 10 mg/dL (ref 6–20)
CO2: 24 mmol/L (ref 22–32)
Calcium: 9 mg/dL (ref 8.9–10.3)
Chloride: 104 mmol/L (ref 101–111)
Creatinine, Ser: 0.97 mg/dL (ref 0.44–1.00)
GFR calc Af Amer: >60 mL/min (ref >60)
GFR calc non Af Amer: 60 mL/min — ABNORMAL LOW (ref >60)
GLUCOSE: 182 mg/dL — AB (ref 65–99)
POTASSIUM: 3.6 mmol/L (ref 3.5–5.1)
SODIUM: 139 mmol/L (ref 135–145)
TOTAL PROTEIN: 6.6 g/dL (ref 6.5–8.1)

## 2015-05-25 LAB — DIFFERENTIAL
Basophils Absolute: 0 10*3/uL (ref 0.0–0.1)
Basophils Relative: 0 %
Eosinophils Absolute: 0.2 10*3/uL (ref 0.0–0.7)
Eosinophils Relative: 3 %
LYMPHS PCT: 32 %
Lymphs Abs: 1.9 10*3/uL (ref 0.7–4.0)
MONO ABS: 0.3 10*3/uL (ref 0.1–1.0)
Monocytes Relative: 5 %
NEUTROS ABS: 3.5 10*3/uL (ref 1.7–7.7)
Neutrophils Relative %: 60 %

## 2015-05-25 LAB — CBC
HCT: 35.8 % — ABNORMAL LOW (ref 36.0–46.0)
Hemoglobin: 11.7 g/dL — ABNORMAL LOW (ref 12.0–15.0)
MCH: 28.1 pg (ref 26.0–34.0)
MCHC: 32.7 g/dL (ref 30.0–36.0)
MCV: 86.1 fL (ref 78.0–100.0)
PLATELETS: 277 10*3/uL (ref 150–400)
RBC: 4.16 MIL/uL (ref 3.87–5.11)
RDW: 14.1 % (ref 11.5–15.5)
WBC: 5.9 10*3/uL (ref 4.0–10.5)

## 2015-05-25 MED ORDER — DIPHENHYDRAMINE HCL 25 MG PO CAPS: 25.0000 mg | ORAL_CAPSULE | ORAL | Status: DC

## 2015-05-25 MED ORDER — SODIUM CHLORIDE 0.9 % IV SOLN
INTRAVENOUS | Status: DC
Start: 2015-05-25 — End: 2015-05-26
  Administered 2015-05-25: 10:00:00 via INTRAVENOUS

## 2015-05-25 MED ORDER — SODIUM CHLORIDE 0.9 % IV SOLN
400.0000 mg | INTRAVENOUS | Status: DC
  Administered 2015-05-25: 400 mg via INTRAVENOUS
  Filled 2015-05-25: qty 40

## 2015-05-25 MED ORDER — ACETAMINOPHEN 325 MG PO TABS: 650.0000 mg | ORAL_TABLET | ORAL | Status: DC

## 2015-06-01 ENCOUNTER — Other Ambulatory Visit: Payer: Self-pay

## 2015-06-01 DIAGNOSIS — Z1231 Encounter for screening mammogram for malignant neoplasm of breast: Secondary | ICD-10-CM

## 2015-06-21 ENCOUNTER — Ambulatory Visit
Admission: RE | Admit: 2015-06-21 | Discharge: 2015-06-21 | Disposition: A | Discharge status: Home / Self Care | Payer: Federal, State, Local not specified - PPO | Source: Ambulatory Visit

## 2015-06-21 DIAGNOSIS — Z1231 Encounter for screening mammogram for malignant neoplasm of breast: Secondary | ICD-10-CM

## 2015-06-27 ENCOUNTER — Other Ambulatory Visit: Payer: Self-pay | Admitting: Dermatology

## 2015-07-05 ENCOUNTER — Other Ambulatory Visit (HOSPITAL_COMMUNITY): Payer: Self-pay | Admitting: *Deleted

## 2015-07-06 ENCOUNTER — Ambulatory Visit (HOSPITAL_COMMUNITY)
Admission: RE | Admit: 2015-07-06 | Discharge: 2015-07-06 | Disposition: A | Discharge status: Home / Self Care | Payer: Federal, State, Local not specified - PPO | Source: Ambulatory Visit | Attending: Rheumatology | Admitting: Rheumatology

## 2015-07-06 DIAGNOSIS — L4050 Arthropathic psoriasis, unspecified: Secondary | ICD-10-CM | POA: Insufficient documentation

## 2015-07-06 LAB — CBC
HCT: 37.9 % (ref 36.0–46.0)
HEMOGLOBIN: 12.4 g/dL (ref 12.0–15.0)
MCH: 29.2 pg (ref 26.0–34.0)
MCHC: 32.7 g/dL (ref 30.0–36.0)
MCV: 89.2 fL (ref 78.0–100.0)
PLATELETS: 261 10*3/uL (ref 150–400)
RBC: 4.25 MIL/uL (ref 3.87–5.11)
RDW: 13.7 % (ref 11.5–15.5)
WBC: 6.6 10*3/uL (ref 4.0–10.5)

## 2015-07-06 LAB — DIFFERENTIAL
BASOS ABS: 0 10*3/uL (ref 0.0–0.1)
Basophils Relative: 0 %
EOS ABS: 0.3 10*3/uL (ref 0.0–0.7)
Eosinophils Relative: 4 %
Lymphocytes Relative: 32 %
Lymphs Abs: 2.2 10*3/uL (ref 0.7–4.0)
Monocytes Absolute: 0.4 10*3/uL (ref 0.1–1.0)
Monocytes Relative: 7 %
NEUTROS ABS: 3.8 10*3/uL (ref 1.7–7.7)
NEUTROS PCT: 57 %

## 2015-07-06 LAB — COMPREHENSIVE METABOLIC PANEL
ALK PHOS: 73 U/L (ref 38–126)
ALT: 15 U/L (ref 14–54)
ANION GAP: 9 (ref 5–15)
AST: 21 U/L (ref 15–41)
Albumin: 3.4 g/dL — ABNORMAL LOW (ref 3.5–5.0)
BUN: 12 mg/dL (ref 6–20)
CALCIUM: 9.1 mg/dL (ref 8.9–10.3)
CO2: 23 mmol/L (ref 22–32)
CREATININE: 1.12 mg/dL — AB (ref 0.44–1.00)
Chloride: 107 mmol/L (ref 101–111)
GFR, EST AFRICAN AMERICAN: 58 mL/min — AB (ref >60)
GFR, EST NON AFRICAN AMERICAN: 50 mL/min — AB (ref >60)
Glucose, Bld: 196 mg/dL — ABNORMAL HIGH (ref 65–99)
Potassium: 4 mmol/L (ref 3.5–5.1)
Sodium: 139 mmol/L (ref 135–145)
Total Bilirubin: 0.6 mg/dL (ref 0.3–1.2)
Total Protein: 6.9 g/dL (ref 6.5–8.1)

## 2015-07-06 MED ORDER — DIPHENHYDRAMINE HCL 25 MG PO CAPS: 25.0000 mg | ORAL_CAPSULE | ORAL | Status: DC

## 2015-07-06 MED ORDER — SODIUM CHLORIDE 0.9 % IV SOLN
400.0000 mg | INTRAVENOUS | Status: DC
  Administered 2015-07-06: 400 mg via INTRAVENOUS
  Filled 2015-07-06: qty 40

## 2015-07-06 MED ORDER — SODIUM CHLORIDE 0.9 % IV SOLN
INTRAVENOUS | Status: DC
  Administered 2015-07-06: 12:00:00 via INTRAVENOUS

## 2015-07-06 MED ORDER — ACETAMINOPHEN 325 MG PO TABS: 650.0000 mg | ORAL_TABLET | ORAL | Status: DC

## 2015-07-12 LAB — QUANTIFERON IN TUBE
QFT TB AG MINUS NIL VALUE: 0.04 [IU]/mL
QUANTIFERON MITOGEN VALUE: >10 IU/mL
QUANTIFERON TB AG VALUE: 0.17 [IU]/mL
QUANTIFERON TB GOLD: NEGATIVE
Quantiferon Nil Value: 0.13 IU/mL

## 2015-07-12 LAB — QUANTIFERON TB GOLD ASSAY (BLOOD)

## 2015-08-16 ENCOUNTER — Encounter (HOSPITAL_COMMUNITY): Payer: Federal, State, Local not specified - PPO

## 2015-08-17 ENCOUNTER — Other Ambulatory Visit (HOSPITAL_COMMUNITY): Payer: Self-pay | Admitting: *Deleted

## 2015-08-20 ENCOUNTER — Ambulatory Visit (HOSPITAL_COMMUNITY)
Admission: RE | Admit: 2015-08-20 | Discharge: 2015-08-20 | Disposition: A | Discharge status: Home / Self Care | Payer: Federal, State, Local not specified - PPO | Source: Ambulatory Visit | Attending: Rheumatology | Admitting: Rheumatology

## 2015-08-20 DIAGNOSIS — L4050 Arthropathic psoriasis, unspecified: Secondary | ICD-10-CM | POA: Diagnosis not present

## 2015-08-20 LAB — COMPREHENSIVE METABOLIC PANEL
ALBUMIN: 3.6 g/dL (ref 3.5–5.0)
ALT: 14 U/L (ref 14–54)
AST: 20 U/L (ref 15–41)
Alkaline Phosphatase: 71 U/L (ref 38–126)
Anion gap: 8 (ref 5–15)
BUN: 16 mg/dL (ref 6–20)
CHLORIDE: 105 mmol/L (ref 101–111)
CO2: 23 mmol/L (ref 22–32)
CREATININE: 1.02 mg/dL — AB (ref 0.44–1.00)
Calcium: 9.2 mg/dL (ref 8.9–10.3)
GFR calc non Af Amer: 56 mL/min — ABNORMAL LOW (ref >60)
GLUCOSE: 195 mg/dL — AB (ref 65–99)
Potassium: 4 mmol/L (ref 3.5–5.1)
SODIUM: 136 mmol/L (ref 135–145)
Total Bilirubin: 0.5 mg/dL (ref 0.3–1.2)
Total Protein: 7.3 g/dL (ref 6.5–8.1)

## 2015-08-20 LAB — CBC
HCT: 37.5 % (ref 36.0–46.0)
HEMOGLOBIN: 12.4 g/dL (ref 12.0–15.0)
MCH: 28.9 pg (ref 26.0–34.0)
MCHC: 33.1 g/dL (ref 30.0–36.0)
MCV: 87.4 fL (ref 78.0–100.0)
PLATELETS: 262 10*3/uL (ref 150–400)
RBC: 4.29 MIL/uL (ref 3.87–5.11)
RDW: 13.6 % (ref 11.5–15.5)
WBC: 5.9 10*3/uL (ref 4.0–10.5)

## 2015-08-20 LAB — DIFFERENTIAL
BASOS ABS: 0 10*3/uL (ref 0.0–0.1)
Basophils Relative: 1 %
EOS ABS: 0.2 10*3/uL (ref 0.0–0.7)
Eosinophils Relative: 3 %
LYMPHS ABS: 1.8 10*3/uL (ref 0.7–4.0)
Lymphocytes Relative: 31 %
MONO ABS: 0.3 10*3/uL (ref 0.1–1.0)
MONOS PCT: 6 %
NEUTROS ABS: 3.6 10*3/uL (ref 1.7–7.7)
Neutrophils Relative %: 61 %

## 2015-08-20 MED ORDER — SODIUM CHLORIDE 0.9 % IV SOLN
400.0000 mg | INTRAVENOUS | Status: DC
  Administered 2015-08-20: 400 mg via INTRAVENOUS
  Filled 2015-08-20: qty 40

## 2015-08-20 MED ORDER — SODIUM CHLORIDE 0.9 % IV SOLN
INTRAVENOUS | Status: DC
  Administered 2015-08-20: 11:00:00 via INTRAVENOUS

## 2015-08-20 MED ORDER — ACETAMINOPHEN 325 MG PO TABS: 650.0000 mg | ORAL_TABLET | ORAL | Status: DC

## 2015-08-20 MED ORDER — DIPHENHYDRAMINE HCL 25 MG PO CAPS: 25.0000 mg | ORAL_CAPSULE | ORAL | Status: DC

## 2015-09-28 ENCOUNTER — Other Ambulatory Visit (HOSPITAL_COMMUNITY): Payer: Self-pay | Admitting: *Deleted

## 2015-10-01 ENCOUNTER — Ambulatory Visit (HOSPITAL_COMMUNITY)
Admission: RE | Admit: 2015-10-01 | Discharge: 2015-10-01 | Disposition: A | Discharge status: Home / Self Care | Payer: Federal, State, Local not specified - PPO | Source: Ambulatory Visit | Attending: Rheumatology | Admitting: Rheumatology

## 2015-10-01 DIAGNOSIS — L4050 Arthropathic psoriasis, unspecified: Secondary | ICD-10-CM | POA: Insufficient documentation

## 2015-10-01 LAB — DIFFERENTIAL
Basophils Absolute: 0 10*3/uL (ref 0.0–0.1)
Basophils Relative: 0 %
Eosinophils Absolute: 0.2 10*3/uL (ref 0.0–0.7)
Eosinophils Relative: 3 %
LYMPHS PCT: 31 %
Lymphs Abs: 1.9 10*3/uL (ref 0.7–4.0)
Monocytes Absolute: 0.5 10*3/uL (ref 0.1–1.0)
Monocytes Relative: 8 %
NEUTROS ABS: 3.6 10*3/uL (ref 1.7–7.7)
NEUTROS PCT: 58 %

## 2015-10-01 LAB — COMPREHENSIVE METABOLIC PANEL
ALBUMIN: 3.7 g/dL (ref 3.5–5.0)
ALT: 19 U/L (ref 14–54)
ANION GAP: 9 (ref 5–15)
AST: 23 U/L (ref 15–41)
Alkaline Phosphatase: 72 U/L (ref 38–126)
BILIRUBIN TOTAL: 0.5 mg/dL (ref 0.3–1.2)
BUN: 17 mg/dL (ref 6–20)
CO2: 22 mmol/L (ref 22–32)
Calcium: 9.2 mg/dL (ref 8.9–10.3)
Chloride: 104 mmol/L (ref 101–111)
Creatinine, Ser: 1.06 mg/dL — ABNORMAL HIGH (ref 0.44–1.00)
GFR calc Af Amer: >60 mL/min (ref >60)
GFR, EST NON AFRICAN AMERICAN: 53 mL/min — AB (ref >60)
Glucose, Bld: 162 mg/dL — ABNORMAL HIGH (ref 65–99)
POTASSIUM: 4 mmol/L (ref 3.5–5.1)
Sodium: 135 mmol/L (ref 135–145)
TOTAL PROTEIN: 7.3 g/dL (ref 6.5–8.1)

## 2015-10-01 LAB — CBC
HEMATOCRIT: 38.5 % (ref 36.0–46.0)
Hemoglobin: 12.5 g/dL (ref 12.0–15.0)
MCH: 28.9 pg (ref 26.0–34.0)
MCHC: 32.5 g/dL (ref 30.0–36.0)
MCV: 88.9 fL (ref 78.0–100.0)
PLATELETS: 253 10*3/uL (ref 150–400)
RBC: 4.33 MIL/uL (ref 3.87–5.11)
RDW: 13.6 % (ref 11.5–15.5)
WBC: 6.2 10*3/uL (ref 4.0–10.5)

## 2015-10-01 MED ORDER — ACETAMINOPHEN 325 MG PO TABS: 650.0000 mg | ORAL_TABLET | ORAL | Status: DC

## 2015-10-01 MED ORDER — SODIUM CHLORIDE 0.9 % IV SOLN
400.0000 mg | INTRAVENOUS | Status: DC
  Administered 2015-10-01: 400 mg via INTRAVENOUS
  Filled 2015-10-01: qty 40

## 2015-10-01 MED ORDER — DIPHENHYDRAMINE HCL 25 MG PO CAPS: 25.0000 mg | ORAL_CAPSULE | ORAL | Status: DC

## 2015-10-01 MED ORDER — SODIUM CHLORIDE 0.9 % IV SOLN
INTRAVENOUS | Status: DC
  Administered 2015-10-01: 11:00:00 via INTRAVENOUS

## 2015-10-27 DIAGNOSIS — L4050 Arthropathic psoriasis, unspecified: Secondary | ICD-10-CM | POA: Insufficient documentation

## 2015-10-27 DIAGNOSIS — L408 Other psoriasis: Secondary | ICD-10-CM | POA: Insufficient documentation

## 2015-11-09 ENCOUNTER — Other Ambulatory Visit (HOSPITAL_COMMUNITY): Payer: Self-pay | Admitting: *Deleted

## 2015-11-13 ENCOUNTER — Ambulatory Visit (HOSPITAL_COMMUNITY)
Admission: RE | Admit: 2015-11-13 | Discharge: 2015-11-13 | Disposition: A | Discharge status: Home / Self Care | Payer: Federal, State, Local not specified - PPO | Source: Ambulatory Visit | Attending: Rheumatology | Admitting: Rheumatology

## 2015-11-13 DIAGNOSIS — L4050 Arthropathic psoriasis, unspecified: Secondary | ICD-10-CM | POA: Insufficient documentation

## 2015-11-13 LAB — COMPREHENSIVE METABOLIC PANEL
ALT: 20 U/L (ref 14–54)
AST: 28 U/L (ref 15–41)
Albumin: 3.6 g/dL (ref 3.5–5.0)
Alkaline Phosphatase: 63 U/L (ref 38–126)
Anion gap: 7 (ref 5–15)
BUN: 14 mg/dL (ref 6–20)
CHLORIDE: 105 mmol/L (ref 101–111)
CO2: 24 mmol/L (ref 22–32)
Calcium: 9 mg/dL (ref 8.9–10.3)
Creatinine, Ser: 1.06 mg/dL — ABNORMAL HIGH (ref 0.44–1.00)
GFR calc Af Amer: >60 mL/min (ref >60)
GFR, EST NON AFRICAN AMERICAN: 53 mL/min — AB (ref >60)
Glucose, Bld: 150 mg/dL — ABNORMAL HIGH (ref 65–99)
POTASSIUM: 4.2 mmol/L (ref 3.5–5.1)
SODIUM: 136 mmol/L (ref 135–145)
Total Bilirubin: 0.4 mg/dL (ref 0.3–1.2)
Total Protein: 7 g/dL (ref 6.5–8.1)

## 2015-11-13 LAB — DIFFERENTIAL
BASOS ABS: 0 10*3/uL (ref 0.0–0.1)
Basophils Relative: 1 %
Eosinophils Absolute: 0.2 10*3/uL (ref 0.0–0.7)
Eosinophils Relative: 3 %
LYMPHS ABS: 1.8 10*3/uL (ref 0.7–4.0)
LYMPHS PCT: 32 %
MONOS PCT: 8 %
Monocytes Absolute: 0.5 10*3/uL (ref 0.1–1.0)
NEUTROS PCT: 56 %
Neutro Abs: 3.2 10*3/uL (ref 1.7–7.7)

## 2015-11-13 LAB — CBC
HCT: 37.5 % (ref 36.0–46.0)
Hemoglobin: 12.2 g/dL (ref 12.0–15.0)
MCH: 29.3 pg (ref 26.0–34.0)
MCHC: 32.5 g/dL (ref 30.0–36.0)
MCV: 89.9 fL (ref 78.0–100.0)
PLATELETS: 270 10*3/uL (ref 150–400)
RBC: 4.17 MIL/uL (ref 3.87–5.11)
RDW: 13.8 % (ref 11.5–15.5)
WBC: 5.7 10*3/uL (ref 4.0–10.5)

## 2015-11-13 MED ORDER — SODIUM CHLORIDE 0.9 % IV SOLN
400.0000 mg | INTRAVENOUS | Status: DC
  Administered 2015-11-13: 400 mg via INTRAVENOUS
  Filled 2015-11-13: qty 20

## 2015-11-13 MED ORDER — ACETAMINOPHEN 325 MG PO TABS: 650.0000 mg | ORAL_TABLET | ORAL | Status: DC

## 2015-11-13 MED ORDER — SODIUM CHLORIDE 0.9 % IV SOLN
INTRAVENOUS | Status: DC
  Administered 2015-11-13: 11:00:00 via INTRAVENOUS

## 2015-11-13 MED ORDER — DIPHENHYDRAMINE HCL 25 MG PO CAPS: 25.0000 mg | ORAL_CAPSULE | ORAL | Status: DC

## 2015-12-24 ENCOUNTER — Other Ambulatory Visit (HOSPITAL_COMMUNITY): Payer: Self-pay | Admitting: *Deleted

## 2015-12-25 ENCOUNTER — Ambulatory Visit (HOSPITAL_COMMUNITY)
Admission: RE | Admit: 2015-12-25 | Discharge: 2015-12-25 | Disposition: A | Discharge status: Home / Self Care | Payer: Federal, State, Local not specified - PPO | Source: Ambulatory Visit | Attending: Rheumatology | Admitting: Rheumatology

## 2015-12-25 DIAGNOSIS — L4050 Arthropathic psoriasis, unspecified: Secondary | ICD-10-CM | POA: Insufficient documentation

## 2015-12-25 LAB — COMPREHENSIVE METABOLIC PANEL
ALK PHOS: 75 U/L (ref 38–126)
ALT: 18 U/L (ref 14–54)
ANION GAP: 8 (ref 5–15)
AST: 22 U/L (ref 15–41)
Albumin: 3.8 g/dL (ref 3.5–5.0)
BUN: 12 mg/dL (ref 6–20)
CALCIUM: 9.2 mg/dL (ref 8.9–10.3)
CHLORIDE: 106 mmol/L (ref 101–111)
CO2: 23 mmol/L (ref 22–32)
Creatinine, Ser: 1.09 mg/dL — ABNORMAL HIGH (ref 0.44–1.00)
GFR, EST AFRICAN AMERICAN: 60 mL/min — AB (ref >60)
GFR, EST NON AFRICAN AMERICAN: 52 mL/min — AB (ref >60)
Glucose, Bld: 109 mg/dL — ABNORMAL HIGH (ref 65–99)
Potassium: 3.8 mmol/L (ref 3.5–5.1)
SODIUM: 137 mmol/L (ref 135–145)
Total Bilirubin: 0.6 mg/dL (ref 0.3–1.2)
Total Protein: 7.4 g/dL (ref 6.5–8.1)

## 2015-12-25 LAB — CBC WITH DIFFERENTIAL/PLATELET
Basophils Absolute: 0 10*3/uL (ref 0.0–0.1)
Basophils Relative: 0 %
EOS ABS: 0.2 10*3/uL (ref 0.0–0.7)
EOS PCT: 3 %
HCT: 38.3 % (ref 36.0–46.0)
Hemoglobin: 12.9 g/dL (ref 12.0–15.0)
LYMPHS ABS: 2.5 10*3/uL (ref 0.7–4.0)
Lymphocytes Relative: 36 %
MCH: 29.6 pg (ref 26.0–34.0)
MCHC: 33.7 g/dL (ref 30.0–36.0)
MCV: 87.8 fL (ref 78.0–100.0)
MONOS PCT: 10 %
Monocytes Absolute: 0.7 10*3/uL (ref 0.1–1.0)
Neutro Abs: 3.5 10*3/uL (ref 1.7–7.7)
Neutrophils Relative %: 51 %
PLATELETS: 246 10*3/uL (ref 150–400)
RBC: 4.36 MIL/uL (ref 3.87–5.11)
RDW: 13.4 % (ref 11.5–15.5)
WBC: 7 10*3/uL (ref 4.0–10.5)

## 2015-12-25 MED ORDER — SODIUM CHLORIDE 0.9 % IV SOLN
400.0000 mg | INTRAVENOUS | Status: DC
  Administered 2015-12-25: 400 mg via INTRAVENOUS
  Filled 2015-12-25: qty 40

## 2015-12-25 MED ORDER — DIPHENHYDRAMINE HCL 25 MG PO CAPS: 25.0000 mg | ORAL_CAPSULE | ORAL | Status: DC

## 2015-12-25 MED ORDER — ACETAMINOPHEN 325 MG PO TABS: 650.0000 mg | ORAL_TABLET | ORAL | Status: DC

## 2015-12-25 MED ORDER — SODIUM CHLORIDE 0.9 % IV SOLN
INTRAVENOUS | Status: DC
  Administered 2015-12-25: 11:00:00 via INTRAVENOUS

## 2016-01-18 ENCOUNTER — Encounter: Payer: Self-pay | Admitting: *Deleted

## 2016-01-18 DIAGNOSIS — F419 Anxiety disorder, unspecified: Secondary | ICD-10-CM

## 2016-01-18 DIAGNOSIS — E7800 Pure hypercholesterolemia, unspecified: Secondary | ICD-10-CM

## 2016-01-18 DIAGNOSIS — I10 Essential (primary) hypertension: Secondary | ICD-10-CM

## 2016-01-18 DIAGNOSIS — M179 Osteoarthritis of knee, unspecified: Secondary | ICD-10-CM

## 2016-01-18 DIAGNOSIS — N189 Chronic kidney disease, unspecified: Secondary | ICD-10-CM

## 2016-01-18 DIAGNOSIS — M1710 Unilateral primary osteoarthritis, unspecified knee: Secondary | ICD-10-CM

## 2016-01-18 HISTORY — DX: Unilateral primary osteoarthritis, unspecified knee: M17.10

## 2016-01-18 HISTORY — DX: Essential (primary) hypertension: I10

## 2016-01-18 HISTORY — DX: Chronic kidney disease, unspecified: N18.9

## 2016-01-18 HISTORY — DX: Osteoarthritis of knee, unspecified: M17.9

## 2016-01-18 HISTORY — DX: Pure hypercholesterolemia, unspecified: E78.00

## 2016-01-18 HISTORY — DX: Anxiety disorder, unspecified: F41.9

## 2016-01-18 NOTE — Progress Notes (Signed)
*IMAGE* Office Visit Note  Patient: Morgan Jensen             Date of Birth: 04-03-1950           MRN: [id number]             PCP: Jonathon Bellows, MD Referring: Angelina Pih, MD Visit Date: 01/21/2016 Occupation:@GUAROCC @    Subjective:  Follow-up on PsA and Ps and HRRX and Right SI Joint Pain  History of Present Illness: Morgan Jensen is a 65 y.o. female  She was last seen in our office 09/18/2015. At that time she was doing well with her Psoriatic arthritis. But her psoriasis was poorly controlled. As a result, I referred her to a dermatologist. He treated her with a medication and patient is responding very well.  She is doing well with her Remicade infusions that she takes every 6 weeks. Her next infusion is due 02/05/2016. She is also taking methotrexate 4 pills per week. And folic acid 1 mg per day. Note that she gets pretreatment with Tylenol 650 and Benadryl 25 mg prior to her Remicade infusion.  She has a viral upper respiratory infection going on for about 1 week. It is not getting any better. As a result of asked her to hold off on the methotrexate for approximately one week. She can also hold off on her Remicade infusion that is due to November 28. If she is doing exceptionally well that she can continue her infusion as scheduled.  Her last labs from 01/04/2016 shows normal CBC with differential and CMP with GFR. Except her GFR is at 60. Note that she has a history of chronic kidney disease but it is stable.  Her only complaint today is her right sacroiliac joint bothering her. This is been going on for approximately a week. She does not have any falls or any injuries. No radiation of pain. No pain along midline.    On 09/18/2015 visit the impression and plan were ===>  IMPRESSION/PLAN:   1.  Psoriatic arthritis.  No joint pain, swelling or stiffness.  Doing great.  2.  Psoriasis.  Active lesions to bilateral elbow joints, dorsal hands and right mid tib/fib for the last  2-3 weeks.  3.  High risk prescription.  On Remicade infusions every 6 weeks, methotrexate 3 per week, folic acid 1 per day and getting pre-treatment with Tylenol 650 mg and Benadryl 25 mg. 4.  Viral pharyngitis.  Occurred 3 weeks ago.  Pain in throat.  No treatment necessary.  Took Tylenol for pain.  5.  History of hypertension.  Well controlled.  Today's blood pressure is 136/73.  6.  History of cholesterol.  Doing well.  7.  History of chronic renal insufficiency.  Doing well today.  8.  Refill methotrexate, but do 4 pills per week, 90-day supply with no refills.  9.  Patient should follow up with Dr. Denna Haggard, dermatologist, for increased psoriasis for topical creams.  10.  Get labs with infusions as already scheduled.  11.  Return to clinic in 4 months for followup.  We want to make sure that the patient's psoriasis is better controlled with the 4 pills of methotrexate and we will make accommodations and change the methotrexate if necessary.   Activities of Daily Living:  Patient reports morning stiffness for 15 minutes.   Patient Reports nocturnal pain.  Difficulty dressing/grooming: Denies Difficulty climbing stairs: Reports Difficulty getting out of chair: Denies Difficulty using hands for taps, buttons, cutlery,  and/or writing: Denies   Review of Systems  Constitutional: Negative for fatigue.  HENT: Negative for mouth sores and mouth dryness.   Eyes: Negative for dryness.  Respiratory: Negative for shortness of breath.   Gastrointestinal: Negative for constipation and diarrhea.  Musculoskeletal: Negative for myalgias and myalgias.  Skin: Negative for sensitivity to sunlight.  Psychiatric/Behavioral: Negative for decreased concentration and sleep disturbance.    PMFS History:  Patient Active Problem List   Diagnosis Date Noted  . Osteoarthritis of knee 01/18/2016  . Hypertension 01/18/2016  . Anxiety 01/18/2016  . Elevated cholesterol 01/18/2016  . CKD (chronic kidney  disease) 01/18/2016  . Psoriatic arthritis ([place]) 10/27/2015  . Other psoriasis 10/27/2015    Past Medical History:  Diagnosis Date  . Anxiety 01/18/2016  . Arthritis    psoriatic arthritis   . CKD (chronic kidney disease) 01/18/2016  . Elevated cholesterol 01/18/2016  . Hypertension 01/18/2016  . Osteoarthritis of knee 01/18/2016  . Psoriasis     Family History  Problem Relation Age of Onset  . Aneurysm Mother   . Cancer Father     Lung Cancer  . Cancer Brother     Esopheageal Cancer   Past Surgical History:  Procedure Laterality Date  . BREAST SURGERY Right 1985   Benign Tumor removed   . LEG SURGERY Left 2004   Fibula and Tibia Rod placement   . TUBAL LIGATION  1993   Social History   Social History Narrative  . No narrative on file   On 09/18/2015 visit: SOCIAL HISTORY:  Is an ex-smoker, smoked 1 pack per day for 15 years, quit in 1998.  No alcohol.  Drinks caffeinated products, exercises by walking 2 times per week.    On 09/18/2015 visit: CURRENT MEDICATIONS:  Bupropion XL, methotrexate 3 per week, folic acid 1 per day, Remicade every 6 weeks, atorvastatin, trazodone, fluoxetine, lisinopril, pantoprazole, supplements.    On 09/18/2015 visit:   MEDICATION ALLERGIES:  NO KNOWN DRUG ALLERGIES.   Objective: Vital Signs: BP 132/70 (BP Location: Right Arm, Patient Position: Sitting, Cuff Size: Large)   Pulse 78   Resp 13   Ht 5\' 3"  (1.6 m)   Wt 177 lb (80.3 kg)   BMI 31.35 kg/m    Physical Exam  Constitutional: She is oriented to person, place, and time. She appears well-developed and well-nourished.  HENT:  Head: Normocephalic and atraumatic.  Eyes: EOM are normal. Pupils are equal, round, and reactive to light.  Cardiovascular: Normal rate, regular rhythm and normal heart sounds.  Exam reveals no gallop and no friction rub.   No murmur heard. Pulmonary/Chest: Effort normal and breath sounds normal. She has no wheezes. She has no rales.  Abdominal: Soft.  Bowel sounds are normal. She exhibits no distension. There is no tenderness. There is no guarding. No hernia.  Musculoskeletal: Normal range of motion. She exhibits no edema, tenderness or deformity.  Lymphadenopathy:    She has no cervical adenopathy.  Neurological: She is alert and oriented to person, place, and time. Coordination normal.  Skin: Skin is warm and dry. Capillary refill takes less than 2 seconds. No rash noted.  Psychiatric: She has a normal mood and affect. Her behavior is normal.     Musculoskeletal Exam:  Full range of motion of all joints Grip strength is equal and strong bilaterally Fibromyalgia tender points are all absent  CDAI Exam: CDAI Homunculus Exam:   Joint Counts:  CDAI Tender Joint count: 0 CDAI Swollen Joint count: 0  Global Assessments:  Patient Global Assessment: 3 Provider Global Assessment: 3  CDAI Calculated Score: 6    Investigation:  On 09/18/2015: INVESTIGATIONS:  Labs from 08/20/2015 show CBC with diff normal, CMP with GFR normal, TB Gold is negative as of April 2017.   No additional findings.   Imaging: No results found.  Speciality Comments: No specialty comments available.    Procedures:  No procedures performed Allergies: Patient has no known allergies.   Assessment / Plan: Visit Diagnoses: Psoriatic arthritis ([place])  High risk medications (not anticoagulants) long-term use  Other psoriasis  Viral upper respiratory tract infection  Chronic kidney disease, unspecified CKD stage  Sacroiliitis ([facility])   Patient is doing well with her Psoriatic arthritis and psoriasis.  She saw Dr. [place] for the psoriasis and he prescribed her Enstilar prn ==> Pt is responding well and using it prn.  Ps now very well controlled.    Orders: No orders of the defined types were placed in this encounter.  Meds ordered this encounter  Medications  . FLUZONE HIGH-DOSE 0.5 ML SUSY  . DISCONTD: lisinopril (PRINIVIL,ZESTRIL) 20 MG  tablet  . diclofenac sodium (VOLTAREN) 1 % GEL    Sig: Voltaren Gel 3 grams to 3 large joints upto TID 3 TUBES with 3 refills    Dispense:  3 Tube    Refill:  3    Voltaren Gel 3 grams to 3 large joints upto TID 3 TUBES with 3 refills    Order Specific Question:   Supervising Provider    Answer:   Lyda Perone    Face-to-face time spent with patient was 30 minutes. 50% of time was spent in counseling and coordination of care.  Follow-Up Instructions: Return in about 5 months (around 06/20/2016) for PsA, Remicade Infusion, MTX 4/week, Folic, Right Sacroilitis.   I examined and evaluated the patient with Eliezer Lofts PA. The plan of care was discussed as noted above.  Bo Merino, MD

## 2016-01-21 ENCOUNTER — Ambulatory Visit (INDEPENDENT_AMBULATORY_CARE_PROVIDER_SITE_OTHER): Payer: Federal, State, Local not specified - PPO | Admitting: Rheumatology

## 2016-01-21 ENCOUNTER — Encounter: Payer: Self-pay | Admitting: Rheumatology

## 2016-01-21 VITALS — BP 132/70 | HR 78 | Resp 13 | Ht 63.0 in | Wt 177.0 lb

## 2016-01-21 DIAGNOSIS — N189 Chronic kidney disease, unspecified: Secondary | ICD-10-CM

## 2016-01-21 DIAGNOSIS — B9789 Other viral agents as the cause of diseases classified elsewhere: Secondary | ICD-10-CM | POA: Diagnosis not present

## 2016-01-21 DIAGNOSIS — L4050 Arthropathic psoriasis, unspecified: Secondary | ICD-10-CM | POA: Diagnosis not present

## 2016-01-21 DIAGNOSIS — M461 Sacroiliitis, not elsewhere classified: Secondary | ICD-10-CM

## 2016-01-21 DIAGNOSIS — Z79899 Other long term (current) drug therapy: Secondary | ICD-10-CM

## 2016-01-21 DIAGNOSIS — L408 Other psoriasis: Secondary | ICD-10-CM

## 2016-01-21 DIAGNOSIS — J069 Acute upper respiratory infection, unspecified: Secondary | ICD-10-CM | POA: Diagnosis not present

## 2016-01-21 MED ORDER — DICLOFENAC SODIUM 1 % TD GEL: TRANSDERMAL | 3 refills | Status: AC

## 2016-01-21 NOTE — Patient Instructions (Signed)
Iliotibial Band Syndrome  Iliotibial band syndrome is pain in the outer, lower thigh. The pain is caused by an inflammation of the iliotibial band. This is a band of thick fibrous tissue that runs down the outside of the thigh. The iliotibial band begins at the hip. It extends to the outer side of the shin bone (tibia) just below the knee joint. The band works with the thigh muscles. Together they provide stability to the outside of the knee joint.  Iliotibial band syndrome occurs when there is inflammation to this band of tissue. This is typically due to over use and not due to an injury. The irritation usually occurs over the outside of the knee joint, at the the end of the thigh bone (femur). The iliotibial band crosses bone and muscle at this point. Between these structures is a cushioning sac (bursa). The bursa should make possible a smooth gliding motion. However, when inflamed, the iliotibial band does not glide easily. When inflamed, there is pain with motion of the knee. Usually the pain worsens with continued movement and the pain goes away with rest.  This problem usually arises when there is a sudden increase in sports activities involving your legs. Running, and playing soccer or basketball are examples of activities causing this. Others who are prone to iliotibial band syndrome include individuals with mechanical problems such as leg length differences, abnormality of walking, bowed legs etc.  HOME CARE INSTRUCTIONS   · Apply ice to the injured area:    Put ice in a plastic bag.    Place a towel between your skin and the bag.    Leave the ice on for 20 minutes, 2-3 times a day.  · Limit excessive training or eliminate training until pain goes away.  · While pain is present, you may use gentle range of motion. Do not resume regular use until instructed by your health care provider. Begin use gradually. Do not increase activity to the point of pain. If pain does develop, decrease activity and continue  the above measures. Gradually increase activities that do not cause discomfort. Do this until you finally achieve normal use.  · Perform low-impact activities while pain is present. Wear proper footwear.  · Only take over-the-counter or prescription medicines for pain, discomfort, or fever as directed by your health care provider.  SEEK MEDICAL CARE IF:   · Your pain increases or pain is not controlled with medications.  · You develop new, unexplained symptoms, or an increase of the symptoms that brought you to your health care provider.  · Your pain and symptoms are not improving or are getting worse.     This information is not intended to replace advice given to you by your health care provider. Make sure you discuss any questions you have with your health care provider.     Document Released: 08/16/2001 Document Revised: 03/17/2014 Document Reviewed: 09/23/2012  Elsevier Interactive Patient Education ©2016 Elsevier Inc.

## 2016-02-05 ENCOUNTER — Encounter (HOSPITAL_COMMUNITY)
Admission: RE | Admit: 2016-02-05 | Discharge: 2016-02-05 | Disposition: A | Discharge status: Home / Self Care | Payer: Federal, State, Local not specified - PPO | Source: Ambulatory Visit | Attending: Rheumatology | Admitting: Rheumatology

## 2016-02-05 ENCOUNTER — Telehealth: Payer: Self-pay | Admitting: Radiology

## 2016-02-05 ENCOUNTER — Encounter: Payer: Self-pay | Admitting: Rheumatology

## 2016-02-05 DIAGNOSIS — L4050 Arthropathic psoriasis, unspecified: Secondary | ICD-10-CM | POA: Diagnosis not present

## 2016-02-05 LAB — COMPREHENSIVE METABOLIC PANEL
ALT: 18 U/L (ref 14–54)
AST: 31 U/L (ref 15–41)
Albumin: 3.6 g/dL (ref 3.5–5.0)
Alkaline Phosphatase: 72 U/L (ref 38–126)
Anion gap: 9 (ref 5–15)
BILIRUBIN TOTAL: 0.6 mg/dL (ref 0.3–1.2)
BUN: 14 mg/dL (ref 6–20)
CHLORIDE: 105 mmol/L (ref 101–111)
CO2: 23 mmol/L (ref 22–32)
CREATININE: 1.19 mg/dL — AB (ref 0.44–1.00)
Calcium: 9 mg/dL (ref 8.9–10.3)
GFR calc Af Amer: 54 mL/min — ABNORMAL LOW (ref >60)
GFR, EST NON AFRICAN AMERICAN: 47 mL/min — AB (ref >60)
GLUCOSE: 225 mg/dL — AB (ref 65–99)
Potassium: 4.4 mmol/L (ref 3.5–5.1)
Sodium: 137 mmol/L (ref 135–145)
TOTAL PROTEIN: 6.8 g/dL (ref 6.5–8.1)

## 2016-02-05 LAB — CBC WITH DIFFERENTIAL/PLATELET
BASOS ABS: 0 10*3/uL (ref 0.0–0.1)
Basophils Relative: 0 %
Eosinophils Absolute: 0.2 10*3/uL (ref 0.0–0.7)
Eosinophils Relative: 3 %
HEMATOCRIT: 38.9 % (ref 36.0–46.0)
Hemoglobin: 13 g/dL (ref 12.0–15.0)
LYMPHS PCT: 29 %
Lymphs Abs: 2.1 10*3/uL (ref 0.7–4.0)
MCH: 29.4 pg (ref 26.0–34.0)
MCHC: 33.4 g/dL (ref 30.0–36.0)
MCV: 88 fL (ref 78.0–100.0)
Monocytes Absolute: 0.5 10*3/uL (ref 0.1–1.0)
Monocytes Relative: 7 %
NEUTROS ABS: 4.4 10*3/uL (ref 1.7–7.7)
NEUTROS PCT: 61 %
Platelets: 284 10*3/uL (ref 150–400)
RBC: 4.42 MIL/uL (ref 3.87–5.11)
RDW: 13.1 % (ref 11.5–15.5)
WBC: 7.3 10*3/uL (ref 4.0–10.5)

## 2016-02-05 MED ORDER — DIPHENHYDRAMINE HCL 25 MG PO TABS
25.0000 mg | ORAL_TABLET | ORAL | Status: DC
  Filled 2016-02-05: qty 1

## 2016-02-05 MED ORDER — ACETAMINOPHEN 325 MG PO TABS: 650.0000 mg | ORAL_TABLET | ORAL | Status: DC

## 2016-02-05 MED ORDER — SODIUM CHLORIDE 0.9 % IV SOLN
INTRAVENOUS | Status: DC
  Administered 2016-02-05: 11:00:00 via INTRAVENOUS

## 2016-02-05 MED ORDER — INFLIXIMAB 100 MG IV SOLR
400.0000 mg | INTRAVENOUS | Status: DC
  Administered 2016-02-05: 400 mg via INTRAVENOUS
  Filled 2016-02-05: qty 10

## 2016-02-05 NOTE — Telephone Encounter (Signed)
Labs/ call pt to discuss if you do not get my chart reply

## 2016-02-05 NOTE — Progress Notes (Signed)
Please ask patient to reduce methotrexate to 3 tablets per week. She should also discuss her creatinine with some her PCP. We'll continue to monitor her labs.

## 2016-03-17 ENCOUNTER — Other Ambulatory Visit (HOSPITAL_COMMUNITY): Payer: Self-pay | Admitting: *Deleted

## 2016-03-18 ENCOUNTER — Other Ambulatory Visit: Payer: Self-pay | Admitting: *Deleted

## 2016-03-18 ENCOUNTER — Encounter (HOSPITAL_COMMUNITY)
Admission: RE | Admit: 2016-03-18 | Discharge: 2016-03-18 | Disposition: A | Discharge status: Home / Self Care | Payer: Federal, State, Local not specified - PPO | Source: Ambulatory Visit | Attending: Rheumatology | Admitting: Rheumatology

## 2016-03-18 ENCOUNTER — Telehealth: Payer: Self-pay | Admitting: Rheumatology

## 2016-03-18 DIAGNOSIS — L4050 Arthropathic psoriasis, unspecified: Secondary | ICD-10-CM | POA: Insufficient documentation

## 2016-03-18 LAB — CBC
HEMATOCRIT: 36.6 % (ref 36.0–46.0)
HEMOGLOBIN: 12.7 g/dL (ref 12.0–15.0)
MCH: 30.2 pg (ref 26.0–34.0)
MCHC: 34.7 g/dL (ref 30.0–36.0)
MCV: 87.1 fL (ref 78.0–100.0)
Platelets: 273 10*3/uL (ref 150–400)
RBC: 4.2 MIL/uL (ref 3.87–5.11)
RDW: 13 % (ref 11.5–15.5)
WBC: 7 10*3/uL (ref 4.0–10.5)

## 2016-03-18 LAB — COMPREHENSIVE METABOLIC PANEL
ALK PHOS: 68 U/L (ref 38–126)
ALT: 20 U/L (ref 14–54)
ANION GAP: 8 (ref 5–15)
AST: 28 U/L (ref 15–41)
Albumin: 3.7 g/dL (ref 3.5–5.0)
BILIRUBIN TOTAL: 0.5 mg/dL (ref 0.3–1.2)
BUN: 16 mg/dL (ref 6–20)
CALCIUM: 9 mg/dL (ref 8.9–10.3)
CO2: 22 mmol/L (ref 22–32)
Chloride: 106 mmol/L (ref 101–111)
Creatinine, Ser: 1.07 mg/dL — ABNORMAL HIGH (ref 0.44–1.00)
GFR calc non Af Amer: 53 mL/min — ABNORMAL LOW (ref >60)
Glucose, Bld: 163 mg/dL — ABNORMAL HIGH (ref 65–99)
Potassium: 4.6 mmol/L (ref 3.5–5.1)
Sodium: 136 mmol/L (ref 135–145)
TOTAL PROTEIN: 7.1 g/dL (ref 6.5–8.1)

## 2016-03-18 MED ORDER — ACETAMINOPHEN 325 MG PO TABS: 650.0000 mg | ORAL_TABLET | ORAL | Status: DC

## 2016-03-18 MED ORDER — SODIUM CHLORIDE 0.9 % IV SOLN
5.0000 mg/kg | INTRAVENOUS | Status: DC
  Administered 2016-03-18: 11:00:00 400 mg via INTRAVENOUS
  Filled 2016-03-18: qty 40

## 2016-03-18 MED ORDER — SODIUM CHLORIDE 0.9 % IV SOLN
Freq: Once | INTRAVENOUS | Status: AC
  Administered 2016-03-18: 11:00:00 via INTRAVENOUS

## 2016-03-18 MED ORDER — DIPHENHYDRAMINE HCL 25 MG PO CAPS: 25.0000 mg | ORAL_CAPSULE | ORAL | Status: DC

## 2016-03-18 NOTE — Progress Notes (Signed)
Labs are stable.

## 2016-03-18 NOTE — Telephone Encounter (Signed)
[facility] short stay called and needs orders on this patient. Patient has appt today.

## 2016-03-18 NOTE — Telephone Encounter (Signed)
Orders have been placed in the computer.

## 2016-04-04 ENCOUNTER — Other Ambulatory Visit: Payer: Self-pay | Admitting: Radiology

## 2016-04-28 ENCOUNTER — Other Ambulatory Visit (HOSPITAL_COMMUNITY): Payer: Self-pay | Admitting: *Deleted

## 2016-04-29 ENCOUNTER — Encounter (HOSPITAL_COMMUNITY)
Admission: RE | Admit: 2016-04-29 | Discharge: 2016-04-29 | Disposition: A | Discharge status: Home / Self Care | Payer: Federal, State, Local not specified - PPO | Source: Ambulatory Visit | Attending: Rheumatology | Admitting: Rheumatology

## 2016-04-29 DIAGNOSIS — L4050 Arthropathic psoriasis, unspecified: Secondary | ICD-10-CM

## 2016-04-29 LAB — COMPREHENSIVE METABOLIC PANEL
ALK PHOS: 67 U/L (ref 38–126)
ALT: 15 U/L (ref 14–54)
ANION GAP: 9 (ref 5–15)
AST: 31 U/L (ref 15–41)
Albumin: 3.7 g/dL (ref 3.5–5.0)
BUN: 18 mg/dL (ref 6–20)
CALCIUM: 9.1 mg/dL (ref 8.9–10.3)
CO2: 22 mmol/L (ref 22–32)
CREATININE: 1.11 mg/dL — AB (ref 0.44–1.00)
Chloride: 104 mmol/L (ref 101–111)
GFR, EST AFRICAN AMERICAN: 59 mL/min — AB (ref >60)
GFR, EST NON AFRICAN AMERICAN: 51 mL/min — AB (ref >60)
Glucose, Bld: 221 mg/dL — ABNORMAL HIGH (ref 65–99)
Potassium: 4.8 mmol/L (ref 3.5–5.1)
SODIUM: 135 mmol/L (ref 135–145)
TOTAL PROTEIN: 7.2 g/dL (ref 6.5–8.1)
Total Bilirubin: 1.2 mg/dL (ref 0.3–1.2)

## 2016-04-29 LAB — CBC
HCT: 38.8 % (ref 36.0–46.0)
HEMOGLOBIN: 13 g/dL (ref 12.0–15.0)
MCH: 29.3 pg (ref 26.0–34.0)
MCHC: 33.5 g/dL (ref 30.0–36.0)
MCV: 87.6 fL (ref 78.0–100.0)
Platelets: 279 10*3/uL (ref 150–400)
RBC: 4.43 MIL/uL (ref 3.87–5.11)
RDW: 13.5 % (ref 11.5–15.5)
WBC: 8 10*3/uL (ref 4.0–10.5)

## 2016-04-29 MED ORDER — SODIUM CHLORIDE 0.9 % IV SOLN
5.0000 mg/kg | INTRAVENOUS | Status: DC
  Administered 2016-04-29: 11:00:00 400 mg via INTRAVENOUS
  Filled 2016-04-29: qty 40

## 2016-04-29 MED ORDER — ACETAMINOPHEN 325 MG PO TABS: 650.0000 mg | ORAL_TABLET | ORAL | Status: DC

## 2016-04-29 MED ORDER — DIPHENHYDRAMINE HCL 25 MG PO CAPS: 25.0000 mg | ORAL_CAPSULE | ORAL | Status: DC

## 2016-04-30 NOTE — Progress Notes (Signed)
Labs stable

## 2016-05-28 ENCOUNTER — Telehealth: Payer: Self-pay | Admitting: Pharmacist

## 2016-05-28 NOTE — Telephone Encounter (Signed)
Opened in error

## 2016-06-02 ENCOUNTER — Other Ambulatory Visit: Payer: Self-pay | Admitting: Rheumatology

## 2016-06-02 NOTE — Telephone Encounter (Signed)
Last Visit: 03/18/16 Next Visit: 06/10/16 Labs: 04/29/16 Stable  Okay to refill MTX?

## 2016-06-03 NOTE — Telephone Encounter (Signed)
ok 

## 2016-06-05 ENCOUNTER — Other Ambulatory Visit: Payer: Self-pay | Admitting: Radiology

## 2016-06-10 ENCOUNTER — Encounter (HOSPITAL_COMMUNITY)
Admission: RE | Admit: 2016-06-10 | Discharge: 2016-06-10 | Disposition: A | Discharge status: Home / Self Care | Payer: Federal, State, Local not specified - PPO | Source: Ambulatory Visit | Attending: Rheumatology | Admitting: Rheumatology

## 2016-06-10 DIAGNOSIS — L4050 Arthropathic psoriasis, unspecified: Secondary | ICD-10-CM

## 2016-06-10 LAB — COMPREHENSIVE METABOLIC PANEL
ALBUMIN: 3.5 g/dL (ref 3.5–5.0)
ALK PHOS: 74 U/L (ref 38–126)
ALT: 17 U/L (ref 14–54)
AST: 23 U/L (ref 15–41)
Anion gap: 9 (ref 5–15)
BUN: 13 mg/dL (ref 6–20)
CALCIUM: 8.9 mg/dL (ref 8.9–10.3)
CHLORIDE: 106 mmol/L (ref 101–111)
CO2: 22 mmol/L (ref 22–32)
Creatinine, Ser: 1.1 mg/dL — ABNORMAL HIGH (ref 0.44–1.00)
GFR calc Af Amer: 59 mL/min — ABNORMAL LOW (ref >60)
GFR calc non Af Amer: 51 mL/min — ABNORMAL LOW (ref >60)
GLUCOSE: 174 mg/dL — AB (ref 65–99)
Potassium: 3.8 mmol/L (ref 3.5–5.1)
SODIUM: 137 mmol/L (ref 135–145)
Total Bilirubin: 0.5 mg/dL (ref 0.3–1.2)
Total Protein: 7 g/dL (ref 6.5–8.1)

## 2016-06-10 LAB — CBC
HCT: 37.2 % (ref 36.0–46.0)
HEMOGLOBIN: 12.5 g/dL (ref 12.0–15.0)
MCH: 29.2 pg (ref 26.0–34.0)
MCHC: 33.6 g/dL (ref 30.0–36.0)
MCV: 86.9 fL (ref 78.0–100.0)
PLATELETS: 240 10*3/uL (ref 150–400)
RBC: 4.28 MIL/uL (ref 3.87–5.11)
RDW: 13.3 % (ref 11.5–15.5)
WBC: 7.4 10*3/uL (ref 4.0–10.5)

## 2016-06-10 MED ORDER — DIPHENHYDRAMINE HCL 25 MG PO CAPS: 25.0000 mg | ORAL_CAPSULE | ORAL | Status: DC

## 2016-06-10 MED ORDER — ACETAMINOPHEN 325 MG PO TABS: 650.0000 mg | ORAL_TABLET | ORAL | Status: DC

## 2016-06-10 MED ORDER — SODIUM CHLORIDE 0.9 % IV SOLN
5.0000 mg/kg | INTRAVENOUS | Status: DC
  Administered 2016-06-10: 400 mg via INTRAVENOUS
  Filled 2016-06-10: qty 40

## 2016-06-10 NOTE — Progress Notes (Signed)
Labs are stable.

## 2016-06-13 ENCOUNTER — Telehealth: Payer: Self-pay

## 2016-06-13 NOTE — Telephone Encounter (Signed)
Received a fax from [employer] regarding patients profile. It states that an explanation of benefits for a treatment of Remicade IV was recently submitted to the savings program. They documentation has been processed and issued a rebate to the patient via their [place] or by check.   Left message for patient to call back to update her.   Will send document to scan center.   Haley Roza, [place], CPhT 1:55 PM

## 2016-06-17 NOTE — Telephone Encounter (Signed)
Patient returned your call.  CB#[phone].  Thank you

## 2016-06-20 ENCOUNTER — Ambulatory Visit: Payer: Federal, State, Local not specified - PPO | Admitting: Rheumatology

## 2016-07-10 ENCOUNTER — Other Ambulatory Visit: Payer: Self-pay | Admitting: Radiology

## 2016-07-10 DIAGNOSIS — L4050 Arthropathic psoriasis, unspecified: Secondary | ICD-10-CM

## 2016-07-10 NOTE — Progress Notes (Signed)
TB gold due with infusion have placed order Infusion orders cbc cmp orders are current along with the tylenol and benadryl

## 2016-07-11 ENCOUNTER — Other Ambulatory Visit: Payer: Self-pay | Admitting: Radiology

## 2016-07-11 DIAGNOSIS — L4050 Arthropathic psoriasis, unspecified: Secondary | ICD-10-CM

## 2016-07-11 NOTE — Progress Notes (Signed)
Orders are in system for CBC CMP Tylenol Benadryl, Remicade and TB gold

## 2016-07-15 ENCOUNTER — Encounter (HOSPITAL_COMMUNITY): Payer: Federal, State, Local not specified - PPO

## 2016-07-22 ENCOUNTER — Encounter (HOSPITAL_COMMUNITY)
Admission: RE | Admit: 2016-07-22 | Discharge: 2016-07-22 | Disposition: A | Discharge status: Home / Self Care | Payer: Federal, State, Local not specified - PPO | Source: Ambulatory Visit | Attending: Rheumatology | Admitting: Rheumatology

## 2016-07-22 DIAGNOSIS — L4050 Arthropathic psoriasis, unspecified: Secondary | ICD-10-CM

## 2016-07-22 LAB — CBC
HCT: 36.6 % (ref 36.0–46.0)
Hemoglobin: 12.3 g/dL (ref 12.0–15.0)
MCH: 29.6 pg (ref 26.0–34.0)
MCHC: 33.6 g/dL (ref 30.0–36.0)
MCV: 88.2 fL (ref 78.0–100.0)
Platelets: 280 10*3/uL (ref 150–400)
RBC: 4.15 MIL/uL (ref 3.87–5.11)
RDW: 13 % (ref 11.5–15.5)
WBC: 7.3 10*3/uL (ref 4.0–10.5)

## 2016-07-22 LAB — COMPREHENSIVE METABOLIC PANEL
ALK PHOS: 82 U/L (ref 38–126)
ALT: 15 U/L (ref 14–54)
ANION GAP: 7 (ref 5–15)
AST: 25 U/L (ref 15–41)
Albumin: 3.6 g/dL (ref 3.5–5.0)
BUN: 15 mg/dL (ref 6–20)
CALCIUM: 9 mg/dL (ref 8.9–10.3)
CO2: 24 mmol/L (ref 22–32)
Chloride: 105 mmol/L (ref 101–111)
Creatinine, Ser: 1.03 mg/dL — ABNORMAL HIGH (ref 0.44–1.00)
GFR calc non Af Amer: 55 mL/min — ABNORMAL LOW (ref >60)
Glucose, Bld: 144 mg/dL — ABNORMAL HIGH (ref 65–99)
POTASSIUM: 3.8 mmol/L (ref 3.5–5.1)
Sodium: 136 mmol/L (ref 135–145)
TOTAL PROTEIN: 8.9 g/dL — AB (ref 6.5–8.1)
Total Bilirubin: 0.4 mg/dL (ref 0.3–1.2)

## 2016-07-22 MED ORDER — DIPHENHYDRAMINE HCL 25 MG PO CAPS: 25.0000 mg | ORAL_CAPSULE | ORAL | Status: DC

## 2016-07-22 MED ORDER — ACETAMINOPHEN 325 MG PO TABS: 650.0000 mg | ORAL_TABLET | ORAL | Status: DC

## 2016-07-22 MED ORDER — SODIUM CHLORIDE 0.9 % IV SOLN
5.0000 mg/kg | INTRAVENOUS | Status: DC
  Administered 2016-07-22: 400 mg via INTRAVENOUS
  Filled 2016-07-22: qty 40

## 2016-07-22 NOTE — Progress Notes (Signed)
stable °

## 2016-07-24 LAB — QUANTIFERON IN TUBE
QUANTIFERON MITOGEN VALUE: 6.27 [IU]/mL
QUANTIFERON NIL VALUE: 0.05 [IU]/mL
QUANTIFERON TB AG VALUE: 0.04 IU/mL
QUANTIFERON TB GOLD: NEGATIVE

## 2016-07-24 LAB — QUANTIFERON TB GOLD ASSAY (BLOOD)

## 2016-08-12 ENCOUNTER — Other Ambulatory Visit: Payer: Self-pay | Admitting: Radiology

## 2016-08-12 ENCOUNTER — Telehealth: Payer: Self-pay | Admitting: Radiology

## 2016-08-12 DIAGNOSIS — L4050 Arthropathic psoriasis, unspecified: Secondary | ICD-10-CM

## 2016-08-12 NOTE — Telephone Encounter (Signed)
Patient needs follow up appointment with Dr Estanislado Pandy, will you please call to make appt? She was due in May

## 2016-08-12 NOTE — Progress Notes (Addendum)
Infusion orders are current for patient CBC CMP Tylenol Benadryl /TB gold not due yet, Appointment was in November, sent message to front desk, she is due for appt last month.

## 2016-08-18 NOTE — Progress Notes (Signed)
Office Visit Note  Patient: Morgan Jensen             Date of Birth: 07/14/1950           MRN: [id number]             PCP: Maurice Small, MD Referring: Maurice Small, MD Visit Date: 08/19/2016 Occupation: '@GUAROCC' @    Subjective:  Medication Management   History of Present Illness: Morgan Jensen is a 66 y.o. female  Last seen 01/21/2016 for an office visit. Note: She is getting IV Remicade infusions as follows: 02/05/2016; 03/18/2016; 04/29/2016; 06/10/2016; 07/22/2016.   Patient has a history of psoriatic arthritis and psoriasis. She takes Remicade IV infusion; methotrexate 3 tablets per week; folic acid 1 mg daily.  Today, patient reports that she is doing really well with his psoriasis/psoriatic arthritis. She is using medications as prescribed. She is having a flare of psoriasis to bilateral elbows and lower posterior aspect of leg and some anterior aspect of leg. She can use steroid creams to get that under control. She will start today and use it sparingly.  Morning stiffness is less than 5 minutes.2 Activities of Daily Living:  Patient reports morning stiffness for 5 minutes.   Patient Denies nocturnal pain.  Difficulty dressing/grooming: Denies Difficulty climbing stairs: Denies Difficulty getting out of chair: Denies Difficulty using hands for taps, buttons, cutlery, and/or writing: Denies    Review of Systems  Constitutional: Negative for fatigue.  HENT: Negative for mouth sores and mouth dryness.   Eyes: Negative for dryness.  Respiratory: Negative for shortness of breath.   Gastrointestinal: Negative for constipation and diarrhea.  Musculoskeletal: Negative for myalgias and myalgias.  Skin: Positive for rash (psoriasis to bilateral elbows and posterior lower legs). Negative for sensitivity to sunlight.  Psychiatric/Behavioral: Negative for decreased concentration and sleep disturbance.    PMFS History:  Patient Active Problem List   Diagnosis Date Noted    . Osteoarthritis of knee 01/18/2016  . Hypertension 01/18/2016  . Anxiety 01/18/2016  . Elevated cholesterol 01/18/2016  . CKD (chronic kidney disease) 01/18/2016  . Psoriatic arthritis ([place]) 10/27/2015  . Other psoriasis 10/27/2015    Past Medical History:  Diagnosis Date  . Anxiety 01/18/2016  . Arthritis    psoriatic arthritis   . CKD (chronic kidney disease) 01/18/2016  . Elevated cholesterol 01/18/2016  . Hypertension 01/18/2016  . Osteoarthritis of knee 01/18/2016  . Psoriasis     Family History  Problem Relation Age of Onset  . Aneurysm Mother   . Cancer Father        Lung Cancer  . Cancer Brother        Esopheageal Cancer   Past Surgical History:  Procedure Laterality Date  . BREAST SURGERY Right 1985   Benign Tumor removed   . LEG SURGERY Left 2004   Fibula and Tibia Rod placement   . TUBAL LIGATION  1993   Social History   Social History Narrative  . No narrative on file     Objective: Vital Signs: BP 120/74   Pulse 78   Resp 16   Ht '5\' 3"'  (1.6 m)   Wt 174 lb (78.9 kg)   BMI 30.82 kg/m    Physical Exam  Constitutional: She is oriented to person, place, and time. She appears well-developed and well-nourished.  HENT:  Head: Normocephalic and atraumatic.  Eyes: EOM are normal. Pupils are equal, round, and reactive to light.  Cardiovascular: Normal rate, regular rhythm and normal  heart sounds.  Exam reveals no gallop and no friction rub.   No murmur heard. Pulmonary/Chest: Effort normal and breath sounds normal. She has no wheezes. She has no rales.  Abdominal: Soft. Bowel sounds are normal. She exhibits no distension. There is no tenderness. There is no guarding. No hernia.  Musculoskeletal: Normal range of motion. She exhibits no edema, tenderness or deformity.  Lymphadenopathy:    She has no cervical adenopathy.  Neurological: She is alert and oriented to person, place, and time. Coordination normal.  Skin: Skin is warm and dry. Capillary  refill takes less than 2 seconds. Rash (psoriasis to bilateral elows and lower legs (see images)) noted.  Psychiatric: She has a normal mood and affect. Her behavior is normal.  Nursing note and vitals reviewed.    Musculoskeletal Exam:  Full range of motion of all joints Grip strength is equal and strong bilaterally Fibromyalgia tender points are all absent  CDAI Exam: CDAI Homunculus Exam:   Joint Counts:  CDAI Tender Joint count: 0 CDAI Swollen Joint count: 0  No synovitis on exam   Investigation: No additional findings. Hospital Outpatient Visit on 07/22/2016  Component Date Value Ref Range Status  . WBC 07/22/2016 7.3  4.0 - 10.5 K/uL Final  . RBC 07/22/2016 4.15  3.87 - 5.11 MIL/uL Final  . Hemoglobin 07/22/2016 12.3  12.0 - 15.0 g/dL Final  . HCT 07/22/2016 36.6  36.0 - 46.0 % Final  . MCV 07/22/2016 88.2  78.0 - 100.0 fL Final  . MCH 07/22/2016 29.6  26.0 - 34.0 pg Final  . MCHC 07/22/2016 33.6  30.0 - 36.0 g/dL Final  . RDW 07/22/2016 13.0  11.5 - 15.5 % Final  . Platelets 07/22/2016 280  150 - 400 K/uL Final  . Sodium 07/22/2016 136  135 - 145 mmol/L Final  . Potassium 07/22/2016 3.8  3.5 - 5.1 mmol/L Final  . Chloride 07/22/2016 105  101 - 111 mmol/L Final  . CO2 07/22/2016 24  22 - 32 mmol/L Final  . Glucose, Bld 07/22/2016 144* 65 - 99 mg/dL Final  . BUN 07/22/2016 15  6 - 20 mg/dL Final  . Creatinine, Ser 07/22/2016 1.03* 0.44 - 1.00 mg/dL Final  . Calcium 07/22/2016 9.0  8.9 - 10.3 mg/dL Final  . Total Protein 07/22/2016 8.9* 6.5 - 8.1 g/dL Final  . Albumin 07/22/2016 3.6  3.5 - 5.0 g/dL Final  . AST 07/22/2016 25  15 - 41 U/L Final  . ALT 07/22/2016 15  14 - 54 U/L Final  . Alkaline Phosphatase 07/22/2016 82  38 - 126 U/L Final  . Total Bilirubin 07/22/2016 0.4  0.3 - 1.2 mg/dL Final  . GFR calc non Af Amer 07/22/2016 55* >60 mL/min Final  . GFR calc Af Amer 07/22/2016 >60  >60 mL/min Final   Comment: (NOTE) The eGFR has been calculated using the CKD  EPI equation. This calculation has not been validated in all clinical situations. eGFR's persistently <60 mL/min signify possible Chronic Kidney Disease.   . Anion gap 07/22/2016 7  5 - 15 Final  . QUANTIFERON INCUBATION 07/22/2016 Comment   Final   Comment: (NOTE) Specimen incubated at [facility], [place], [place]. Performed At: [facility] [place], [place] [id number] Lindon Romp MD OF:[id number]   . QUANTIFERON TB GOLD 07/22/2016 Negative  Negative Final  . QUANTIFERON CRITERIA 07/22/2016 Comment   Final   Comment: (NOTE) To be considered positive a specimen should have a TB Ag minus  Nil value greater than or equal to 0.35 IU/mL and in addition the TB Ag minus Nil value must be greater than or equal to 25% of the Nil value. There may be insufficient information in these values to differentiate between some negative and some indeterminate test values.   . QUANTIFERON TB AG VALUE 07/22/2016 0.04  IU/mL Final  . Quantiferon Nil Value 07/22/2016 0.05  IU/mL Final  . QUANTIFERON MITOGEN VALUE 07/22/2016 6.27  IU/mL Final  . QFT TB AG MINUS NIL VALUE 07/22/2016 <0.00  IU/mL Final  . Interpretation: 07/22/2016 Comment   Final   Comment: (NOTE) The QuantiFERON TB Gold (in Tube) assay is intended for use as an aid in the diagnosis of TB infection. Negative results suggest that there is no TB infection. In patients with high suspicion of exposure, a negative test should be repeated. A positive test indicates infection with Mycobacterium tuberculosis. Among individuals without tuberculosis infection, a positive test may be due to exposure to [place], M. szulgai or M. marinum. On the Internet, go to https://figueroa-lambert.info/ for further details. Performed At: [facility] [place], [place] [id number] [place] [id number]   Hospital Outpatient Visit on 06/10/2016  Component Date Value Ref Range Status  . WBC 06/10/2016 7.4  4.0 - 10.5  K/uL Final  . RBC 06/10/2016 4.28  3.87 - 5.11 MIL/uL Final  . Hemoglobin 06/10/2016 12.5  12.0 - 15.0 g/dL Final  . HCT 06/10/2016 37.2  36.0 - 46.0 % Final  . MCV 06/10/2016 86.9  78.0 - 100.0 fL Final  . MCH 06/10/2016 29.2  26.0 - 34.0 pg Final  . MCHC 06/10/2016 33.6  30.0 - 36.0 g/dL Final  . RDW 06/10/2016 13.3  11.5 - 15.5 % Final  . Platelets 06/10/2016 240  150 - 400 K/uL Final  . Sodium 06/10/2016 137  135 - 145 mmol/L Final  . Potassium 06/10/2016 3.8  3.5 - 5.1 mmol/L Final  . Chloride 06/10/2016 106  101 - 111 mmol/L Final  . CO2 06/10/2016 22  22 - 32 mmol/L Final  . Glucose, Bld 06/10/2016 174* 65 - 99 mg/dL Final  . BUN 06/10/2016 13  6 - 20 mg/dL Final  . Creatinine, Ser 06/10/2016 1.10* 0.44 - 1.00 mg/dL Final  . Calcium 06/10/2016 8.9  8.9 - 10.3 mg/dL Final  . Total Protein 06/10/2016 7.0  6.5 - 8.1 g/dL Final  . Albumin 06/10/2016 3.5  3.5 - 5.0 g/dL Final  . AST 06/10/2016 23  15 - 41 U/L Final  . ALT 06/10/2016 17  14 - 54 U/L Final  . Alkaline Phosphatase 06/10/2016 74  38 - 126 U/L Final  . Total Bilirubin 06/10/2016 0.5  0.3 - 1.2 mg/dL Final  . GFR calc non Af Amer 06/10/2016 51* >60 mL/min Final  . GFR calc Af Amer 06/10/2016 59* >60 mL/min Final   Comment: (NOTE) The eGFR has been calculated using the CKD EPI equation. This calculation has not been validated in all clinical situations. eGFR's persistently <60 mL/min signify possible Chronic Kidney Disease.   Georgiann Hahn gap 06/10/2016 9  5 - 15 Final  Hospital Outpatient Visit on 04/29/2016  Component Date Value Ref Range Status  . WBC 04/29/2016 8.0  4.0 - 10.5 K/uL Final  . RBC 04/29/2016 4.43  3.87 - 5.11 MIL/uL Final  . Hemoglobin 04/29/2016 13.0  12.0 - 15.0 g/dL Final  . HCT 04/29/2016 38.8  36.0 - 46.0 % Final  . MCV  04/29/2016 87.6  78.0 - 100.0 fL Final  . MCH 04/29/2016 29.3  26.0 - 34.0 pg Final  . MCHC 04/29/2016 33.5  30.0 - 36.0 g/dL Final  . RDW 04/29/2016 13.5  11.5 - 15.5 % Final  .  Platelets 04/29/2016 279  150 - 400 K/uL Final  . Sodium 04/29/2016 135  135 - 145 mmol/L Final  . Potassium 04/29/2016 4.8  3.5 - 5.1 mmol/L Final   HEMOLYSIS AT THIS LEVEL MAY AFFECT RESULT  . Chloride 04/29/2016 104  101 - 111 mmol/L Final  . CO2 04/29/2016 22  22 - 32 mmol/L Final  . Glucose, Bld 04/29/2016 221* 65 - 99 mg/dL Final  . BUN 04/29/2016 18  6 - 20 mg/dL Final  . Creatinine, Ser 04/29/2016 1.11* 0.44 - 1.00 mg/dL Final  . Calcium 04/29/2016 9.1  8.9 - 10.3 mg/dL Final  . Total Protein 04/29/2016 7.2  6.5 - 8.1 g/dL Final  . Albumin 04/29/2016 3.7  3.5 - 5.0 g/dL Final  . AST 04/29/2016 31  15 - 41 U/L Final  . ALT 04/29/2016 15  14 - 54 U/L Final  . Alkaline Phosphatase 04/29/2016 67  38 - 126 U/L Final  . Total Bilirubin 04/29/2016 1.2  0.3 - 1.2 mg/dL Final  . GFR calc non Af Amer 04/29/2016 51* >60 mL/min Final  . GFR calc Af Amer 04/29/2016 59* >60 mL/min Final   Comment: (NOTE) The eGFR has been calculated using the CKD EPI equation. This calculation has not been validated in all clinical situations. eGFR's persistently <60 mL/min signify possible Chronic Kidney Disease.   Georgiann Hahn gap 04/29/2016 9  5 - 15 Final  Hospital Outpatient Visit on 03/18/2016  Component Date Value Ref Range Status  . WBC 03/18/2016 7.0  4.0 - 10.5 K/uL Final  . RBC 03/18/2016 4.20  3.87 - 5.11 MIL/uL Final  . Hemoglobin 03/18/2016 12.7  12.0 - 15.0 g/dL Final  . HCT 03/18/2016 36.6  36.0 - 46.0 % Final  . MCV 03/18/2016 87.1  78.0 - 100.0 fL Final  . MCH 03/18/2016 30.2  26.0 - 34.0 pg Final  . MCHC 03/18/2016 34.7  30.0 - 36.0 g/dL Final  . RDW 03/18/2016 13.0  11.5 - 15.5 % Final  . Platelets 03/18/2016 273  150 - 400 K/uL Final  . Sodium 03/18/2016 136  135 - 145 mmol/L Final  . Potassium 03/18/2016 4.6  3.5 - 5.1 mmol/L Final  . Chloride 03/18/2016 106  101 - 111 mmol/L Final  . CO2 03/18/2016 22  22 - 32 mmol/L Final  . Glucose, Bld 03/18/2016 163* 65 - 99 mg/dL Final  . BUN  03/18/2016 16  6 - 20 mg/dL Final  . Creatinine, Ser 03/18/2016 1.07* 0.44 - 1.00 mg/dL Final  . Calcium 03/18/2016 9.0  8.9 - 10.3 mg/dL Final  . Total Protein 03/18/2016 7.1  6.5 - 8.1 g/dL Final  . Albumin 03/18/2016 3.7  3.5 - 5.0 g/dL Final  . AST 03/18/2016 28  15 - 41 U/L Final  . ALT 03/18/2016 20  14 - 54 U/L Final  . Alkaline Phosphatase 03/18/2016 68  38 - 126 U/L Final  . Total Bilirubin 03/18/2016 0.5  0.3 - 1.2 mg/dL Final  . GFR calc non Af Amer 03/18/2016 53* >60 mL/min Final  . GFR calc Af Amer 03/18/2016 >60  >60 mL/min Final   Comment: (NOTE) The eGFR has been calculated using the CKD EPI equation. This calculation has not been  validated in all clinical situations. eGFR's persistently <60 mL/min signify possible Chronic Kidney Disease.   . Anion gap 03/18/2016 8  5 - 15 Final     Imaging: No results found.           Speciality Comments: No specialty comments available.    Procedures:  No procedures performed Allergies: Patient has no known allergies.   Assessment / Plan:     Visit Diagnoses: Psoriatic arthritis ([place])  High risk medications (not anticoagulants) long-term use  Other psoriasis  Sacroiliitis ([facility])   Plan: #1: Psoriatic arthritis, psoriasis.  #2: High risk prescription Remicade infusion every month Methotrexate 3 tablets per week Labs are up-to-date and normal as of May 2018: CBC with differential and CMP with GFR normal; TB gold is negative  #3:bone density ordered by pcp.last one was normal within last 3 years.     Orders: No orders of the defined types were placed in this encounter.  No orders of the defined types were placed in this encounter.   Face-to-face time spent with patient was 30 minutes. 50% of time was spent in counseling and coordination of care.  Follow-Up Instructions: No Follow-up on file.   Eliezer Lofts, PA-C   I examined and evaluated the patient with Eliezer Lofts PA. The plan of care  was discussed as noted above. Patient had no active synovitis on exam. She still have some psoriasis plaques. She was satisfied with current treatment. We will continue with the Remicade infusions and low-dose methotrexate.  Bo Merino, MD   Note - This record has been created using Editor, commissioning.  Chart creation errors have been sought, but may not always  have been located. Such creation errors do not reflect on  the standard of medical care.

## 2016-08-19 ENCOUNTER — Ambulatory Visit (INDEPENDENT_AMBULATORY_CARE_PROVIDER_SITE_OTHER): Payer: Federal, State, Local not specified - PPO | Admitting: Rheumatology

## 2016-08-19 ENCOUNTER — Encounter: Payer: Self-pay | Admitting: Rheumatology

## 2016-08-19 VITALS — BP 120/74 | HR 78 | Resp 16 | Ht 63.0 in | Wt 174.0 lb

## 2016-08-19 DIAGNOSIS — R5383 Other fatigue: Secondary | ICD-10-CM | POA: Diagnosis not present

## 2016-08-19 DIAGNOSIS — Z780 Asymptomatic menopausal state: Secondary | ICD-10-CM | POA: Diagnosis not present

## 2016-08-19 DIAGNOSIS — L408 Other psoriasis: Secondary | ICD-10-CM | POA: Diagnosis not present

## 2016-08-19 DIAGNOSIS — L4050 Arthropathic psoriasis, unspecified: Secondary | ICD-10-CM

## 2016-08-19 DIAGNOSIS — M461 Sacroiliitis, not elsewhere classified: Secondary | ICD-10-CM | POA: Diagnosis not present

## 2016-08-19 DIAGNOSIS — E559 Vitamin D deficiency, unspecified: Secondary | ICD-10-CM

## 2016-08-19 DIAGNOSIS — Z79899 Other long term (current) drug therapy: Secondary | ICD-10-CM

## 2016-08-19 NOTE — Addendum Note (Signed)
Addended byEliezer Lofts on: 08/19/2016 02:26 PM   Modules accepted: Orders

## 2016-08-20 ENCOUNTER — Other Ambulatory Visit: Payer: Self-pay | Admitting: Family Medicine

## 2016-08-20 DIAGNOSIS — Z1231 Encounter for screening mammogram for malignant neoplasm of breast: Secondary | ICD-10-CM

## 2016-08-20 LAB — VITAMIN D 25 HYDROXY (VIT D DEFICIENCY, FRACTURES): Vit D, 25-Hydroxy: 32 ng/mL (ref 30–100)

## 2016-08-26 ENCOUNTER — Ambulatory Visit
Admission: RE | Admit: 2016-08-26 | Discharge: 2016-08-26 | Disposition: A | Discharge status: Home / Self Care | Payer: Federal, State, Local not specified - PPO | Source: Ambulatory Visit | Attending: Family Medicine | Admitting: Family Medicine

## 2016-08-26 DIAGNOSIS — Z1231 Encounter for screening mammogram for malignant neoplasm of breast: Secondary | ICD-10-CM

## 2016-09-01 ENCOUNTER — Other Ambulatory Visit (HOSPITAL_COMMUNITY): Payer: Self-pay | Admitting: *Deleted

## 2016-09-02 ENCOUNTER — Encounter (HOSPITAL_COMMUNITY)
Admission: RE | Admit: 2016-09-02 | Discharge: 2016-09-02 | Disposition: A | Discharge status: Home / Self Care | Payer: Federal, State, Local not specified - PPO | Source: Ambulatory Visit | Attending: Rheumatology | Admitting: Rheumatology

## 2016-09-02 DIAGNOSIS — L4050 Arthropathic psoriasis, unspecified: Secondary | ICD-10-CM | POA: Diagnosis present

## 2016-09-02 LAB — COMPREHENSIVE METABOLIC PANEL
ALBUMIN: 3.7 g/dL (ref 3.5–5.0)
ALK PHOS: 67 U/L (ref 38–126)
ALT: 18 U/L (ref 14–54)
ANION GAP: 9 (ref 5–15)
AST: 24 U/L (ref 15–41)
BUN: 15 mg/dL (ref 6–20)
CALCIUM: 9 mg/dL (ref 8.9–10.3)
CO2: 20 mmol/L — AB (ref 22–32)
Chloride: 106 mmol/L (ref 101–111)
Creatinine, Ser: 1.04 mg/dL — ABNORMAL HIGH (ref 0.44–1.00)
GFR calc Af Amer: >60 mL/min (ref >60)
GFR calc non Af Amer: 54 mL/min — ABNORMAL LOW (ref >60)
GLUCOSE: 235 mg/dL — AB (ref 65–99)
Potassium: 4.1 mmol/L (ref 3.5–5.1)
SODIUM: 135 mmol/L (ref 135–145)
Total Bilirubin: 0.4 mg/dL (ref 0.3–1.2)
Total Protein: 7 g/dL (ref 6.5–8.1)

## 2016-09-02 LAB — CBC
HCT: 36.6 % (ref 36.0–46.0)
HEMOGLOBIN: 12.2 g/dL (ref 12.0–15.0)
MCH: 29.2 pg (ref 26.0–34.0)
MCHC: 33.3 g/dL (ref 30.0–36.0)
MCV: 87.6 fL (ref 78.0–100.0)
Platelets: 230 10*3/uL (ref 150–400)
RBC: 4.18 MIL/uL (ref 3.87–5.11)
RDW: 13.3 % (ref 11.5–15.5)
WBC: 6.4 10*3/uL (ref 4.0–10.5)

## 2016-09-02 MED ORDER — SODIUM CHLORIDE 0.9 % IV SOLN
5.0000 mg/kg | INTRAVENOUS | Status: DC
  Administered 2016-09-02: 400 mg via INTRAVENOUS
  Filled 2016-09-02: qty 40

## 2016-09-02 MED ORDER — ACETAMINOPHEN 325 MG PO TABS: 650.0000 mg | ORAL_TABLET | ORAL | Status: DC

## 2016-09-02 MED ORDER — DIPHENHYDRAMINE HCL 25 MG PO CAPS: 25.0000 mg | ORAL_CAPSULE | ORAL | Status: DC

## 2016-09-02 NOTE — Progress Notes (Signed)
Her labs are stable except glucose is elevated. Please notify patient in fact results to her PCP

## 2016-09-02 NOTE — Progress Notes (Signed)
Within normal limits

## 2016-10-07 ENCOUNTER — Other Ambulatory Visit: Payer: Self-pay | Admitting: Radiology

## 2016-10-07 DIAGNOSIS — L4050 Arthropathic psoriasis, unspecified: Secondary | ICD-10-CM

## 2016-10-07 NOTE — Progress Notes (Signed)
Infusion orders updated today including CBC CMP appointments are up to date and a follow up appointment is scheduled/ TB gold is due on 07/2017  Remicade Tylenol and Benadryl are already active orders.

## 2016-10-14 ENCOUNTER — Encounter (HOSPITAL_COMMUNITY)
Admission: RE | Admit: 2016-10-14 | Discharge: 2016-10-14 | Disposition: A | Discharge status: Home / Self Care | Payer: Federal, State, Local not specified - PPO | Source: Ambulatory Visit | Attending: Rheumatology | Admitting: Rheumatology

## 2016-10-14 ENCOUNTER — Encounter: Payer: Self-pay | Admitting: *Deleted

## 2016-10-14 DIAGNOSIS — L4050 Arthropathic psoriasis, unspecified: Secondary | ICD-10-CM | POA: Diagnosis not present

## 2016-10-14 LAB — CBC WITH DIFFERENTIAL/PLATELET
BASOS ABS: 0 10*3/uL (ref 0.0–0.1)
Basophils Relative: 0 %
EOS PCT: 3 %
Eosinophils Absolute: 0.2 10*3/uL (ref 0.0–0.7)
HEMATOCRIT: 36.3 % (ref 36.0–46.0)
Hemoglobin: 12.2 g/dL (ref 12.0–15.0)
Lymphocytes Relative: 34 %
Lymphs Abs: 2.1 10*3/uL (ref 0.7–4.0)
MCH: 28.9 pg (ref 26.0–34.0)
MCHC: 33.6 g/dL (ref 30.0–36.0)
MCV: 86 fL (ref 78.0–100.0)
MONO ABS: 0.5 10*3/uL (ref 0.1–1.0)
MONOS PCT: 8 %
NEUTROS ABS: 3.4 10*3/uL (ref 1.7–7.7)
Neutrophils Relative %: 55 %
PLATELETS: 264 10*3/uL (ref 150–400)
RBC: 4.22 MIL/uL (ref 3.87–5.11)
RDW: 13.3 % (ref 11.5–15.5)
WBC: 6.1 10*3/uL (ref 4.0–10.5)

## 2016-10-14 LAB — COMPREHENSIVE METABOLIC PANEL
ALT: 19 U/L (ref 14–54)
ANION GAP: 9 (ref 5–15)
AST: 26 U/L (ref 15–41)
Albumin: 3.7 g/dL (ref 3.5–5.0)
Alkaline Phosphatase: 71 U/L (ref 38–126)
BILIRUBIN TOTAL: 0.6 mg/dL (ref 0.3–1.2)
BUN: 13 mg/dL (ref 6–20)
CHLORIDE: 106 mmol/L (ref 101–111)
CO2: 21 mmol/L — ABNORMAL LOW (ref 22–32)
Calcium: 9.2 mg/dL (ref 8.9–10.3)
Creatinine, Ser: 1.13 mg/dL — ABNORMAL HIGH (ref 0.44–1.00)
GFR calc Af Amer: 57 mL/min — ABNORMAL LOW (ref >60)
GFR, EST NON AFRICAN AMERICAN: 49 mL/min — AB (ref >60)
Glucose, Bld: 143 mg/dL — ABNORMAL HIGH (ref 65–99)
POTASSIUM: 3.8 mmol/L (ref 3.5–5.1)
Sodium: 136 mmol/L (ref 135–145)
Total Protein: 7.6 g/dL (ref 6.5–8.1)

## 2016-10-14 MED ORDER — ACETAMINOPHEN 325 MG PO TABS: 650.0000 mg | ORAL_TABLET | ORAL | Status: DC

## 2016-10-14 MED ORDER — SODIUM CHLORIDE 0.9 % IV SOLN
5.0000 mg/kg | INTRAVENOUS | Status: AC
  Administered 2016-10-14: 400 mg via INTRAVENOUS
  Filled 2016-10-14: qty 40

## 2016-10-14 MED ORDER — DIPHENHYDRAMINE HCL 25 MG PO CAPS: 25.0000 mg | ORAL_CAPSULE | ORAL | Status: DC

## 2016-10-14 NOTE — Progress Notes (Signed)
Labs are stable. GFR is low. We'll continue to monitor.

## 2016-11-08 ENCOUNTER — Other Ambulatory Visit: Payer: Self-pay | Admitting: Rheumatology

## 2016-11-11 NOTE — Telephone Encounter (Signed)
Last Visit: 08/19/16 Next Visit: 01/19/17 Labs: 10/14/16 stable  Okay to refill per Dr. Estanislado Pandy

## 2016-11-20 ENCOUNTER — Telehealth: Payer: Self-pay

## 2016-11-20 NOTE — Telephone Encounter (Addendum)
[place] to verify infusion benefits for pt. Spoke with Marva Panda. Who states that no pre-certification is required. Pt has met her $5500 deductible. The infusion will be covered at 100%. [facility] is in network.   Reference number: [id number] Phone number: [phone]  Demetrios Loll, CPhT 11:55 AM

## 2016-11-24 ENCOUNTER — Telehealth: Payer: Self-pay | Admitting: Rheumatology

## 2016-11-24 ENCOUNTER — Other Ambulatory Visit: Payer: Self-pay | Admitting: Rheumatology

## 2016-11-24 DIAGNOSIS — L4050 Arthropathic psoriasis, unspecified: Secondary | ICD-10-CM

## 2016-11-24 NOTE — Telephone Encounter (Signed)
Patient comes Tuesday am for Remicade. Please release orders.

## 2016-11-24 NOTE — Telephone Encounter (Signed)
Infusion orders updated today including CBC CMP Tylenol and Benadryl appointments are up to date and a follow up appointment is scheduled/ TB gold is due on 07/22/17

## 2016-11-24 NOTE — Progress Notes (Signed)
Infusion orders updated today including CBC CMP Tylenol and Benadryl appointments are up to date and a follow up appointment is scheduled/ TB gold is due on May 2019

## 2016-11-25 ENCOUNTER — Encounter (HOSPITAL_COMMUNITY)
Admission: RE | Admit: 2016-11-25 | Discharge: 2016-11-25 | Disposition: A | Discharge status: Home / Self Care | Payer: Federal, State, Local not specified - PPO | Source: Ambulatory Visit | Attending: Rheumatology | Admitting: Rheumatology

## 2016-11-25 DIAGNOSIS — L4050 Arthropathic psoriasis, unspecified: Secondary | ICD-10-CM | POA: Diagnosis not present

## 2016-11-25 MED ORDER — SODIUM CHLORIDE 0.9 % IV SOLN
5.0000 mg/kg | INTRAVENOUS | Status: DC
  Administered 2016-11-25: 400 mg via INTRAVENOUS
  Filled 2016-11-25: qty 40

## 2016-11-25 MED ORDER — ACETAMINOPHEN 325 MG PO TABS: 650.0000 mg | ORAL_TABLET | ORAL | Status: DC

## 2016-11-25 MED ORDER — DIPHENHYDRAMINE HCL 25 MG PO CAPS: 25.0000 mg | ORAL_CAPSULE | ORAL | Status: DC

## 2016-12-19 ENCOUNTER — Other Ambulatory Visit: Payer: Self-pay | Admitting: Rheumatology

## 2017-01-06 ENCOUNTER — Inpatient Hospital Stay (HOSPITAL_COMMUNITY): Admission: RE | Admit: 2017-01-06 | Payer: Federal, State, Local not specified - PPO | Source: Ambulatory Visit

## 2017-01-06 NOTE — Progress Notes (Signed)
Office Visit Note  Patient: Morgan Jensen             Date of Birth: February 19, 1951           MRN: [id number]             PCP: Maurice Small, MD Referring: Maurice Small, MD Visit Date: 01/19/2017 Occupation: @GUAROCC @    Subjective:  Medication management   History of Present Illness: Morgan Jensen is a 66 y.o. female with history of psoriatic arthritis and osteoarthritis. She states she's been having some discomfort in her right knee joint. She states the discomfort is only in the morning when she gets up and after she stretches her legs a few imes the discomfort goes away. She denies any joint swelling.she has a few old  patches of psoriasis on her lower extremity.  Activities of Daily Living:  Patient reports morning stiffness for 1 minute.   Patient Denies nocturnal pain.  Difficulty dressing/grooming: Denies Difficulty climbing stairs: Reports Difficulty getting out of chair: Denies Difficulty using hands for taps, buttons, cutlery, and/or writing: Denies   Review of Systems  Constitutional: Negative for fatigue.  HENT: Negative for mouth dryness.   Eyes: Negative for dryness.  Respiratory: Negative for shortness of breath.   Cardiovascular: Negative for swelling in legs/feet.  Gastrointestinal: Negative for constipation.  Endocrine: Negative for excessive thirst.  Genitourinary: Negative for difficulty urinating.  Musculoskeletal: Positive for arthralgias, joint pain and morning stiffness.  Skin: Positive for rash and redness.  Neurological: Negative for numbness.  Hematological: Negative for bruising/bleeding tendency.  Psychiatric/Behavioral: Negative for sleep disturbance.    PMFS History:  Patient Active Problem List   Diagnosis Date Noted  . Osteoarthritis of knee 01/18/2016  . Hypertension 01/18/2016  . Anxiety 01/18/2016  . Elevated cholesterol 01/18/2016  . CKD (chronic kidney disease) 01/18/2016  . Psoriatic arthritis ([place]) 10/27/2015  . Other psoriasis  10/27/2015    Past Medical History:  Diagnosis Date  . Anxiety 01/18/2016  . Arthritis    psoriatic arthritis   . CKD (chronic kidney disease) 01/18/2016  . Elevated cholesterol 01/18/2016  . Hypertension 01/18/2016  . Osteoarthritis of knee 01/18/2016  . Psoriasis     Family History  Problem Relation Age of Onset  . Aneurysm Mother   . Cancer Father        Lung Cancer  . Cancer Brother        Esopheageal Cancer   Past Surgical History:  Procedure Laterality Date  . BREAST EXCISIONAL BIOPSY Right 1986  . BREAST SURGERY Right 1985   Benign Tumor removed   . LEG SURGERY Left 2004   Fibula and Tibia Rod placement   . TUBAL LIGATION  1993   Social History   Social History Narrative  . Not on file     Objective: Vital Signs: BP (!) 153/75 (BP Location: Left Arm, Patient Position: Sitting, Cuff Size: Normal)   Pulse 77   Resp 18   Ht 5\' 3"  (1.6 m)   Wt 176 lb (79.8 kg)   BMI 31.18 kg/m    Physical Exam  Constitutional: She is oriented to person, place, and time. She appears well-developed and well-nourished.  HENT:  Head: Normocephalic and atraumatic.  Eyes: Conjunctivae and EOM are normal.  Neck: Normal range of motion.  Cardiovascular: Normal rate, regular rhythm, normal heart sounds and intact distal pulses.  Pulmonary/Chest: Effort normal and breath sounds normal.  Abdominal: Soft. Bowel sounds are normal.  Lymphadenopathy:  She has no cervical adenopathy.  Neurological: She is alert and oriented to person, place, and time.  Skin: Skin is warm and dry. Capillary refill takes less than 2 seconds.  Psychiatric: She has a normal mood and affect. Her behavior is normal.  Nursing note and vitals reviewed.    Musculoskeletal Exam: C-spine and thoracic lumbar spine good range of motion. Shoulder joints elbow joints wrist joint MCPs PIPs DIPs with good range of motion with no synovitis. Hip joints knee joints ankles MTPs PIPs DIPs are good range of motion with  no synovitis.  CDAI Exam: CDAI Homunculus Exam:   Joint Counts:  CDAI Tender Joint count: 0 CDAI Swollen Joint count: 0  Global Assessments:  Patient Global Assessment: 1 Provider Global Assessment: 1  CDAI Calculated Score: 2    Investigation: No additional findings. May 2018 TB gold negative CBC Latest Ref Rng & Units 01/13/2017 10/14/2016 09/02/2016  WBC 4.0 - 10.5 K/uL 6.1 6.1 6.4  Hemoglobin 12.0 - 15.0 g/dL 11.3(L) 12.2 12.2  Hematocrit 36.0 - 46.0 % 34.2(L) 36.3 36.6  Platelets 150 - 400 K/uL 257 264 230   CMP Latest Ref Rng & Units 01/13/2017 10/14/2016 09/02/2016  Glucose 65 - 99 mg/dL 116(H) 143(H) 235(H)  BUN 6 - 20 mg/dL 9 13 15   Creatinine 0.44 - 1.00 mg/dL 1.06(H) 1.13(H) 1.04(H)  Sodium 135 - 145 mmol/L 138 136 135  Potassium 3.5 - 5.1 mmol/L 3.7 3.8 4.1  Chloride 101 - 111 mmol/L 106 106 106  CO2 22 - 32 mmol/L 24 21(L) 20(L)  Calcium 8.9 - 10.3 mg/dL 8.6(L) 9.2 9.0  Total Protein 6.5 - 8.1 g/dL 6.2(L) 7.6 7.0  Total Bilirubin 0.3 - 1.2 mg/dL 0.9 0.6 0.4  Alkaline Phos 38 - 126 U/L 68 71 67  AST 15 - 41 U/L 25 26 24   ALT 14 - 54 U/L 19 19 18    Imaging: No results found.  Speciality Comments: Remicade 5mg /kg every 6 weeks TB gold negative 07/22/16    Procedures:  No procedures performed Allergies: Patient has no known allergies.   Assessment / Plan:     Visit Diagnoses: Psoriatic arthritis ([facility])patient has no synovitis on examination.  Other psoriasis: She has few scattered patches on her extremities.  High risk medication use - MTX 3 tabs po q wk, folic acid 1mg  po qd, Remicade IV 5 mg/ Kg  q6 weeks Her labs are stable. She has mild anemia and mild elevation of creatinine. We will continue to monitor that.  Primary osteoarthritis of both knees: She's been having some discomfort in her right knee joint. No warmth swelling or effusion was noted.  History of hypertension: Her systolic blood pressure is elevated today.i've advisedher to  monitor.  History of anxiety  History of hypercholesterolemia  History of chronic kidney disease : Her GFR is stable.   Orders: No orders of the defined types were placed in this encounter.  No orders of the defined types were placed in this encounter.     Follow-Up Instructions: Return in about 5 months (around 06/19/2017) for PsA OA.   Bo Merino, MD  Note - This record has been created using Editor, commissioning.  Chart creation errors have been sought, but may not always  have been located. Such creation errors do not reflect on  the standard of medical care.

## 2017-01-12 ENCOUNTER — Other Ambulatory Visit: Payer: Self-pay | Admitting: *Deleted

## 2017-01-12 NOTE — Progress Notes (Signed)
Infusion orders are current for patient CBC CMP Tylenol Benadryl appointments are up to date and follow up appointment  is scheduled TB gold not due yet.  

## 2017-01-13 ENCOUNTER — Encounter (HOSPITAL_COMMUNITY)
Admission: RE | Admit: 2017-01-13 | Discharge: 2017-01-13 | Disposition: A | Discharge status: Home / Self Care | Payer: Federal, State, Local not specified - PPO | Source: Ambulatory Visit | Attending: Rheumatology | Admitting: Rheumatology

## 2017-01-13 DIAGNOSIS — L4050 Arthropathic psoriasis, unspecified: Secondary | ICD-10-CM | POA: Insufficient documentation

## 2017-01-13 LAB — CBC
HCT: 34.2 % — ABNORMAL LOW (ref 36.0–46.0)
HEMOGLOBIN: 11.3 g/dL — AB (ref 12.0–15.0)
MCH: 29 pg (ref 26.0–34.0)
MCHC: 33 g/dL (ref 30.0–36.0)
MCV: 87.9 fL (ref 78.0–100.0)
PLATELETS: 257 10*3/uL (ref 150–400)
RBC: 3.89 MIL/uL (ref 3.87–5.11)
RDW: 13.4 % (ref 11.5–15.5)
WBC: 6.1 10*3/uL (ref 4.0–10.5)

## 2017-01-13 LAB — COMPREHENSIVE METABOLIC PANEL
ALBUMIN: 3.2 g/dL — AB (ref 3.5–5.0)
ALK PHOS: 68 U/L (ref 38–126)
ALT: 19 U/L (ref 14–54)
AST: 25 U/L (ref 15–41)
Anion gap: 8 (ref 5–15)
BUN: 9 mg/dL (ref 6–20)
CALCIUM: 8.6 mg/dL — AB (ref 8.9–10.3)
CHLORIDE: 106 mmol/L (ref 101–111)
CO2: 24 mmol/L (ref 22–32)
CREATININE: 1.06 mg/dL — AB (ref 0.44–1.00)
GFR calc non Af Amer: 53 mL/min — ABNORMAL LOW (ref >60)
GLUCOSE: 116 mg/dL — AB (ref 65–99)
Potassium: 3.7 mmol/L (ref 3.5–5.1)
SODIUM: 138 mmol/L (ref 135–145)
Total Bilirubin: 0.9 mg/dL (ref 0.3–1.2)
Total Protein: 6.2 g/dL — ABNORMAL LOW (ref 6.5–8.1)

## 2017-01-13 MED ORDER — SODIUM CHLORIDE 0.9 % IV SOLN
INTRAVENOUS | Status: DC
  Administered 2017-01-13: 11:00:00 via INTRAVENOUS

## 2017-01-13 MED ORDER — DIPHENHYDRAMINE HCL 25 MG PO CAPS: 25.0000 mg | ORAL_CAPSULE | ORAL | Status: DC

## 2017-01-13 MED ORDER — ACETAMINOPHEN 325 MG PO TABS: 650.0000 mg | ORAL_TABLET | ORAL | Status: DC

## 2017-01-13 MED ORDER — SODIUM CHLORIDE 0.9 % IV SOLN
5.0000 mg/kg | INTRAVENOUS | Status: DC
  Administered 2017-01-13: 400 mg via INTRAVENOUS
  Filled 2017-01-13: qty 40

## 2017-01-13 NOTE — Progress Notes (Signed)
Mild anemia and low GFR. Rest of the labs are stable.

## 2017-01-19 ENCOUNTER — Encounter: Payer: Self-pay | Admitting: Rheumatology

## 2017-01-19 ENCOUNTER — Ambulatory Visit (INDEPENDENT_AMBULATORY_CARE_PROVIDER_SITE_OTHER): Payer: Federal, State, Local not specified - PPO | Admitting: Rheumatology

## 2017-01-19 VITALS — BP 153/75 | HR 77 | Resp 18 | Ht 63.0 in | Wt 176.0 lb

## 2017-01-19 DIAGNOSIS — Z8639 Personal history of other endocrine, nutritional and metabolic disease: Secondary | ICD-10-CM | POA: Diagnosis not present

## 2017-01-19 DIAGNOSIS — Z87448 Personal history of other diseases of urinary system: Secondary | ICD-10-CM

## 2017-01-19 DIAGNOSIS — M170 Bilateral primary osteoarthritis of knee: Secondary | ICD-10-CM | POA: Diagnosis not present

## 2017-01-19 DIAGNOSIS — Z79899 Other long term (current) drug therapy: Secondary | ICD-10-CM

## 2017-01-19 DIAGNOSIS — L408 Other psoriasis: Secondary | ICD-10-CM

## 2017-01-19 DIAGNOSIS — L4050 Arthropathic psoriasis, unspecified: Secondary | ICD-10-CM | POA: Diagnosis not present

## 2017-01-19 DIAGNOSIS — Z8679 Personal history of other diseases of the circulatory system: Secondary | ICD-10-CM

## 2017-01-19 DIAGNOSIS — Z8659 Personal history of other mental and behavioral disorders: Secondary | ICD-10-CM

## 2017-01-26 ENCOUNTER — Telehealth: Payer: Self-pay

## 2017-01-26 NOTE — Telephone Encounter (Signed)
Called patient to verify benefits for 2019. If plan(s) have changed, we will need to complete a BIV for infusion services. Left message for patient to call back.   Dewight Catino, [place], CPhT 3:40 PM

## 2017-02-02 ENCOUNTER — Telehealth (INDEPENDENT_AMBULATORY_CARE_PROVIDER_SITE_OTHER): Payer: Self-pay

## 2017-02-02 NOTE — Telephone Encounter (Signed)
Patient was returning your call.  Cb# is [phone].  Please advise.  Thank you.

## 2017-02-03 NOTE — Telephone Encounter (Signed)
*  See previous telephone call*

## 2017-02-03 NOTE — Telephone Encounter (Signed)
Patient returned call. *see note*   Called patient back. Had to leave a message.   Sharen Youngren, [place], CPhT 3:28 PM

## 2017-02-12 ENCOUNTER — Telehealth: Payer: Self-pay

## 2017-02-12 NOTE — Telephone Encounter (Signed)
Called the patient to verify benefits for 2019. Pt currently received infusions that may require a pre-certification. Spoke to patient who states that her plan will remain the same ([place]). She also states that the [employer] program contacted her and she was told that she was still covered to receive copay assistance for 2019. Will submit an annual reverification to [employer] for infusion benefits with pts insurance.   Will update once we receive a response.   Nishita Isaacks, [place], CPhT 12:17 PM

## 2017-02-21 ENCOUNTER — Other Ambulatory Visit: Payer: Self-pay | Admitting: Rheumatology

## 2017-02-23 ENCOUNTER — Other Ambulatory Visit (HOSPITAL_COMMUNITY): Payer: Self-pay

## 2017-02-23 NOTE — Telephone Encounter (Signed)
Last Visit: 01/19/17 Next Visit: 06/24/17  Okay to refill per Dr. Estanislado Pandy

## 2017-02-24 ENCOUNTER — Encounter (HOSPITAL_COMMUNITY)
Admission: RE | Admit: 2017-02-24 | Discharge: 2017-02-24 | Disposition: A | Discharge status: Home / Self Care | Payer: Federal, State, Local not specified - PPO | Source: Ambulatory Visit | Attending: Rheumatology | Admitting: Rheumatology

## 2017-02-24 DIAGNOSIS — L4050 Arthropathic psoriasis, unspecified: Secondary | ICD-10-CM | POA: Diagnosis not present

## 2017-02-24 MED ORDER — ACETAMINOPHEN 325 MG PO TABS: 650.0000 mg | ORAL_TABLET | ORAL | Status: DC

## 2017-02-24 MED ORDER — SODIUM CHLORIDE 0.9 % IV SOLN: INTRAVENOUS | Status: DC

## 2017-02-24 MED ORDER — SODIUM CHLORIDE 0.9 % IV SOLN
5.0000 mg/kg | Freq: Once | INTRAVENOUS | Status: DC
  Administered 2017-02-24: 12:00:00 400 mg via INTRAVENOUS
  Filled 2017-02-24: qty 40

## 2017-02-24 MED ORDER — DIPHENHYDRAMINE HCL 25 MG PO CAPS: 25.0000 mg | ORAL_CAPSULE | ORAL | Status: DC

## 2017-04-03 ENCOUNTER — Other Ambulatory Visit: Payer: Self-pay | Admitting: *Deleted

## 2017-04-03 NOTE — Progress Notes (Signed)
Infusion orders are current for patient CBC CMP Tylenol Benadryl appointments are up to date and follow up appointment  is scheduled TB gold not due yet.  

## 2017-04-04 ENCOUNTER — Other Ambulatory Visit: Payer: Self-pay | Admitting: Rheumatology

## 2017-04-06 NOTE — Telephone Encounter (Signed)
Last Visit: 01/19/17 Next Visit: 06/24/17 Labs: 01/13/17 Mild anemia and low GFR. Rest of the labs are stable.  Okay to refill per Dr. Estanislado Pandy

## 2017-04-07 ENCOUNTER — Encounter (HOSPITAL_COMMUNITY)
Admission: RE | Admit: 2017-04-07 | Discharge: 2017-04-07 | Disposition: A | Discharge status: Home / Self Care | Payer: Federal, State, Local not specified - PPO | Source: Ambulatory Visit | Attending: Rheumatology | Admitting: Rheumatology

## 2017-04-07 DIAGNOSIS — L4050 Arthropathic psoriasis, unspecified: Secondary | ICD-10-CM | POA: Diagnosis not present

## 2017-04-07 LAB — COMPREHENSIVE METABOLIC PANEL
ALT: 16 U/L (ref 14–54)
ANION GAP: 10 (ref 5–15)
AST: 22 U/L (ref 15–41)
Albumin: 3.4 g/dL — ABNORMAL LOW (ref 3.5–5.0)
Alkaline Phosphatase: 77 U/L (ref 38–126)
BUN: 16 mg/dL (ref 6–20)
CHLORIDE: 105 mmol/L (ref 101–111)
CO2: 21 mmol/L — ABNORMAL LOW (ref 22–32)
Calcium: 8.8 mg/dL — ABNORMAL LOW (ref 8.9–10.3)
Creatinine, Ser: 1.07 mg/dL — ABNORMAL HIGH (ref 0.44–1.00)
GFR calc Af Amer: >60 mL/min (ref >60)
GFR, EST NON AFRICAN AMERICAN: 52 mL/min — AB (ref >60)
Glucose, Bld: 174 mg/dL — ABNORMAL HIGH (ref 65–99)
POTASSIUM: 3.9 mmol/L (ref 3.5–5.1)
Sodium: 136 mmol/L (ref 135–145)
Total Bilirubin: 0.6 mg/dL (ref 0.3–1.2)
Total Protein: 7 g/dL (ref 6.5–8.1)

## 2017-04-07 LAB — CBC
HCT: 37 % (ref 36.0–46.0)
Hemoglobin: 12.3 g/dL (ref 12.0–15.0)
MCH: 29.1 pg (ref 26.0–34.0)
MCHC: 33.2 g/dL (ref 30.0–36.0)
MCV: 87.5 fL (ref 78.0–100.0)
Platelets: 261 K/uL (ref 150–400)
RBC: 4.23 MIL/uL (ref 3.87–5.11)
RDW: 13.1 % (ref 11.5–15.5)
WBC: 7.5 K/uL (ref 4.0–10.5)

## 2017-04-07 MED ORDER — SODIUM CHLORIDE 0.9 % IV SOLN: INTRAVENOUS | Status: DC

## 2017-04-07 MED ORDER — DIPHENHYDRAMINE HCL 25 MG PO CAPS: 25.0000 mg | ORAL_CAPSULE | ORAL | Status: DC

## 2017-04-07 MED ORDER — SODIUM CHLORIDE 0.9 % IV SOLN
5.0000 mg/kg | Freq: Once | INTRAVENOUS | Status: AC
  Administered 2017-04-07: 400 mg via INTRAVENOUS
  Filled 2017-04-07: qty 40

## 2017-04-07 MED ORDER — ACETAMINOPHEN 325 MG PO TABS: 650.0000 mg | ORAL_TABLET | ORAL | Status: DC

## 2017-04-07 NOTE — Progress Notes (Signed)
Glu is high. Advise Ca intake. GFR is stable.Please fax results to her PCP.

## 2017-04-07 NOTE — Progress Notes (Signed)
WNL

## 2017-04-08 ENCOUNTER — Telehealth: Payer: Self-pay | Admitting: Rheumatology

## 2017-04-08 NOTE — Telephone Encounter (Signed)
Patient advised of lab results and recommendations. patient verbalized understanding.

## 2017-04-08 NOTE — Telephone Encounter (Signed)
Patient returned your call.

## 2017-05-13 ENCOUNTER — Other Ambulatory Visit: Payer: Self-pay | Admitting: *Deleted

## 2017-05-13 DIAGNOSIS — L4050 Arthropathic psoriasis, unspecified: Secondary | ICD-10-CM

## 2017-05-19 ENCOUNTER — Encounter (HOSPITAL_COMMUNITY): Admission: RE | Admit: 2017-05-19 | Payer: Federal, State, Local not specified - PPO | Source: Ambulatory Visit

## 2017-05-26 ENCOUNTER — Encounter (HOSPITAL_COMMUNITY)
Admission: RE | Admit: 2017-05-26 | Discharge: 2017-05-26 | Disposition: A | Discharge status: Home / Self Care | Payer: Federal, State, Local not specified - PPO | Source: Ambulatory Visit | Attending: Rheumatology | Admitting: Rheumatology

## 2017-05-26 DIAGNOSIS — L4050 Arthropathic psoriasis, unspecified: Secondary | ICD-10-CM

## 2017-05-26 DIAGNOSIS — R7989 Other specified abnormal findings of blood chemistry: Secondary | ICD-10-CM | POA: Diagnosis not present

## 2017-05-26 LAB — COMPREHENSIVE METABOLIC PANEL
ALK PHOS: 70 U/L (ref 38–126)
ALT: 15 U/L (ref 14–54)
AST: 20 U/L (ref 15–41)
Albumin: 3.5 g/dL (ref 3.5–5.0)
Anion gap: 8 (ref 5–15)
BUN: 14 mg/dL (ref 6–20)
CALCIUM: 8.8 mg/dL — AB (ref 8.9–10.3)
CHLORIDE: 103 mmol/L (ref 101–111)
CO2: 24 mmol/L (ref 22–32)
CREATININE: 1.19 mg/dL — AB (ref 0.44–1.00)
GFR calc non Af Amer: 46 mL/min — ABNORMAL LOW (ref >60)
GFR, EST AFRICAN AMERICAN: 53 mL/min — AB (ref >60)
Glucose, Bld: 192 mg/dL — ABNORMAL HIGH (ref 65–99)
Potassium: 4.2 mmol/L (ref 3.5–5.1)
Sodium: 135 mmol/L (ref 135–145)
TOTAL PROTEIN: 6.8 g/dL (ref 6.5–8.1)
Total Bilirubin: 0.5 mg/dL (ref 0.3–1.2)

## 2017-05-26 LAB — CBC
HCT: 37.2 % (ref 36.0–46.0)
Hemoglobin: 12.2 g/dL (ref 12.0–15.0)
MCH: 29 pg (ref 26.0–34.0)
MCHC: 32.8 g/dL (ref 30.0–36.0)
MCV: 88.4 fL (ref 78.0–100.0)
PLATELETS: 267 10*3/uL (ref 150–400)
RBC: 4.21 MIL/uL (ref 3.87–5.11)
RDW: 13.8 % (ref 11.5–15.5)
WBC: 5.9 10*3/uL (ref 4.0–10.5)

## 2017-05-26 MED ORDER — DIPHENHYDRAMINE HCL 25 MG PO CAPS: 25.0000 mg | ORAL_CAPSULE | ORAL | Status: DC

## 2017-05-26 MED ORDER — ACETAMINOPHEN 325 MG PO TABS: 650.0000 mg | ORAL_TABLET | ORAL | Status: DC

## 2017-05-26 MED ORDER — SODIUM CHLORIDE 0.9 % IV SOLN: INTRAVENOUS | Status: DC

## 2017-05-26 MED ORDER — INFLIXIMAB 100 MG IV SOLR
5.0000 mg/kg | INTRAVENOUS | Status: DC
  Administered 2017-05-26: 12:00:00 400 mg via INTRAVENOUS
  Filled 2017-05-26: qty 40

## 2017-05-26 NOTE — Progress Notes (Signed)
She has elevated creatinine and elevated LFTs.  It is most likely due to combination therapy with statins and methotrexate.  Please have her reduce methotrexate to 3 tablets/week.  We will continue to monitor her labs.

## 2017-05-27 ENCOUNTER — Telehealth: Payer: Self-pay | Admitting: Rheumatology

## 2017-05-27 NOTE — Telephone Encounter (Signed)
Patient advised of lab results and recommendations. Patient verbalized understanding.  

## 2017-05-27 NOTE — Telephone Encounter (Signed)
Patient left a voicemail returning your call regarding her lab results.

## 2017-05-28 ENCOUNTER — Other Ambulatory Visit: Payer: Self-pay | Admitting: Rheumatology

## 2017-05-29 NOTE — Telephone Encounter (Signed)
Last Visit: 01/19/17 Next Visit: 06/24/17  Okay to refill per Dr. Estanislado Pandy

## 2017-06-10 NOTE — Progress Notes (Signed)
Office Visit Note  Patient: Morgan Jensen             Date of Birth: [birth year]           MRN: [id number]             PCP: Maurice Small, MD Referring: Maurice Small, MD Visit Date: 06/24/2017 Occupation: @GUAROCC @    Subjective:  Psoriasis and SI joint discomfort.   History of Present Illness: Morgan Jensen is a 67 y.o. female with history of psoriatic arthritis, psoriasis and osteoarthritis.  She states she has been doing quite well on Remicade and methotrexate combination.  She states she does have some SI joint discomfort after strenuous activity otherwise she does not hurt on regular basis.  She continues to have some psoriasis rash on her right lower extremity and bilateral elbows.  She is not having much discomfort in her knee joints currently.  Activities of Daily Living:  Patient reports morning stiffness for 5 minute.   Patient Denies nocturnal pain.  Difficulty dressing/grooming: Denies Difficulty climbing stairs: Reports Difficulty getting out of chair: Reports Difficulty using hands for taps, buttons, cutlery, and/or writing: Denies   Review of Systems  Constitutional: Negative for fatigue, night sweats, weight gain and weight loss.  HENT: Negative for mouth sores, trouble swallowing, trouble swallowing, mouth dryness and nose dryness.   Eyes: Negative for pain, redness, visual disturbance and dryness.  Respiratory: Negative for cough, shortness of breath and difficulty breathing.   Cardiovascular: Negative for chest pain, palpitations, hypertension, irregular heartbeat and swelling in legs/feet.  Gastrointestinal: Negative for blood in stool, constipation and diarrhea.  Endocrine: Negative for increased urination.  Genitourinary: Negative for vaginal dryness.  Musculoskeletal: Positive for arthralgias, joint pain and morning stiffness. Negative for joint swelling, myalgias, muscle weakness, muscle tenderness and myalgias.  Skin: Positive for rash. Negative for color  change, hair loss, skin tightness, ulcers and sensitivity to sunlight.  Allergic/Immunologic: Negative for susceptible to infections.  Neurological: Negative for dizziness, memory loss, night sweats and weakness.  Hematological: Negative for swollen glands.  Psychiatric/Behavioral: Negative for depressed mood and sleep disturbance. The patient is not nervous/anxious.     PMFS History:  Patient Active Problem List   Diagnosis Date Noted  . Osteoarthritis of knee 01/18/2016  . Hypertension 01/18/2016  . Anxiety 01/18/2016  . Elevated cholesterol 01/18/2016  . CKD (chronic kidney disease) 01/18/2016  . Psoriatic arthritis ([place]) 10/27/2015  . Other psoriasis 10/27/2015    Past Medical History:  Diagnosis Date  . Anxiety 01/18/2016  . Arthritis    psoriatic arthritis   . CKD (chronic kidney disease) 01/18/2016  . Elevated cholesterol 01/18/2016  . Hypertension 01/18/2016  . Osteoarthritis of knee 01/18/2016  . Psoriasis     Family History  Problem Relation Age of Onset  . Aneurysm Mother   . Cancer Father        Lung Cancer  . Cancer Brother        Esopheageal Cancer   Past Surgical History:  Procedure Laterality Date  . BREAST EXCISIONAL BIOPSY Right 1986  . BREAST SURGERY Right 1985   Benign Tumor removed   . LEG SURGERY Left 2004   Fibula and Tibia Rod placement   . TUBAL LIGATION  1993   Social History   Social History Narrative  . Not on file     Objective: Vital Signs: BP 133/77 (BP Location: Left Arm, Patient Position: Sitting, Cuff Size: Normal)   Pulse 78   Resp  12   Ht 5\' 3"  (1.6 m)   Wt 171 lb (77.6 kg)   BMI 30.29 kg/m    Physical Exam  Constitutional: She is oriented to person, place, and time. She appears well-developed and well-nourished.  HENT:  Head: Normocephalic and atraumatic.  Eyes: Conjunctivae and EOM are normal.  Neck: Normal range of motion.  Cardiovascular: Normal rate, regular rhythm, normal heart sounds and intact distal  pulses.  Pulmonary/Chest: Effort normal and breath sounds normal.  Abdominal: Soft. Bowel sounds are normal.  Lymphadenopathy:    She has no cervical adenopathy.  Neurological: She is alert and oriented to person, place, and time.  Skin: Skin is warm and dry. Capillary refill takes less than 2 seconds. Rash noted.  Psoriasis patches on bilateral elbows and right lower extremity  Psychiatric: She has a normal mood and affect. Her behavior is normal.  Nursing note and vitals reviewed.    Musculoskeletal Exam: Spine good range of motion.  Shoulder joints elbow joints wrist joints were in good range of motion.  She has some DIP PIP thickening due to osteoarthritis but no synovitis was noted.  She is crepitus in her knee joints without any warmth swelling or effusion.  All other joints were in good range of motion.  CDAI Exam: CDAI Homunculus Exam:   Joint Counts:  CDAI Tender Joint count: 0 CDAI Swollen Joint count: 0  Global Assessments:  Patient Global Assessment: 1 Provider Global Assessment: 1  CDAI Calculated Score: 2    Investigation: No additional findings.TB Gold: 07/22/2016 Negative  CBC Latest Ref Rng & Units 05/26/2017 04/07/2017 01/13/2017  WBC 4.0 - 10.5 K/uL 5.9 7.5 6.1  Hemoglobin 12.0 - 15.0 g/dL 12.2 12.3 11.3(L)  Hematocrit 36.0 - 46.0 % 37.2 37.0 34.2(L)  Platelets 150 - 400 K/uL 267 261 257   CMP Latest Ref Rng & Units 05/26/2017 04/07/2017 01/13/2017  Glucose 65 - 99 mg/dL 192(H) 174(H) 116(H)  BUN 6 - 20 mg/dL 14 16 9   Creatinine 0.44 - 1.00 mg/dL 1.19(H) 1.07(H) 1.06(H)  Sodium 135 - 145 mmol/L 135 136 138  Potassium 3.5 - 5.1 mmol/L 4.2 3.9 3.7  Chloride 101 - 111 mmol/L 103 105 106  CO2 22 - 32 mmol/L 24 21(L) 24  Calcium 8.9 - 10.3 mg/dL 8.8(L) 8.8(L) 8.6(L)  Total Protein 6.5 - 8.1 g/dL 6.8 7.0 6.2(L)  Total Bilirubin 0.3 - 1.2 mg/dL 0.5 0.6 0.9  Alkaline Phos 38 - 126 U/L 70 77 68  AST 15 - 41 U/L 20 22 25   ALT 14 - 54 U/L 15 16 19     Imaging: No  results found.  Speciality Comments: Remicade 5mg /kg every 6 weeks TB gold negative 07/22/16    Procedures:  No procedures performed Allergies: Patient has no known allergies.   Assessment / Plan:     Visit Diagnoses: Psoriatic arthritis ([place])- Patient has no active synovitis on examination she appears to be doing well on combination of Remicade and methotrexate.  Other psoriasis-she continues to have some psoriasis patches.  She states she will get the topical medication from her dermatologist to use.  High risk medication use - MTX, Remicade 52milligram per kilogram every 6 weeks, folic acid 1 mg po qd.  Her labs have been stable.  She has low GFR which is been stable.  We discussed getting shingrix vaccine.  Primary osteoarthritis of both knees-she continues to have some discomfort in her knee joints which is tolerable.  Exercises were discussed.  History of chronic  kidney disease-GFR is a stable.  History of hypertension-blood pressure is a stable.  History of hypercholesterolemia  History of anxiety -controlled with medications.   Orders: No orders of the defined types were placed in this encounter.  No orders of the defined types were placed in this encounter.   Face-to-face time spent with patient was 30 minutes. >50% of time was spent in counseling and coordination of care.  Follow-Up Instructions: Return in about 5 months (around 11/24/2017) for Psoriatic arthritis,Ps,OA.   Bo Merino, MD  Note - This record has been created using Editor, commissioning.  Chart creation errors have been sought, but may not always  have been located. Such creation errors do not reflect on  the standard of medical care.

## 2017-06-24 ENCOUNTER — Other Ambulatory Visit: Payer: Self-pay | Admitting: *Deleted

## 2017-06-24 ENCOUNTER — Encounter: Payer: Self-pay | Admitting: Rheumatology

## 2017-06-24 ENCOUNTER — Ambulatory Visit: Payer: Federal, State, Local not specified - PPO | Admitting: Rheumatology

## 2017-06-24 VITALS — BP 133/77 | HR 78 | Resp 12 | Ht 63.0 in | Wt 171.0 lb

## 2017-06-24 DIAGNOSIS — Z8659 Personal history of other mental and behavioral disorders: Secondary | ICD-10-CM

## 2017-06-24 DIAGNOSIS — Z8639 Personal history of other endocrine, nutritional and metabolic disease: Secondary | ICD-10-CM | POA: Diagnosis not present

## 2017-06-24 DIAGNOSIS — Z8679 Personal history of other diseases of the circulatory system: Secondary | ICD-10-CM | POA: Diagnosis not present

## 2017-06-24 DIAGNOSIS — L408 Other psoriasis: Secondary | ICD-10-CM

## 2017-06-24 DIAGNOSIS — Z79899 Other long term (current) drug therapy: Secondary | ICD-10-CM | POA: Diagnosis not present

## 2017-06-24 DIAGNOSIS — Z87448 Personal history of other diseases of urinary system: Secondary | ICD-10-CM

## 2017-06-24 DIAGNOSIS — L4050 Arthropathic psoriasis, unspecified: Secondary | ICD-10-CM | POA: Diagnosis not present

## 2017-06-24 DIAGNOSIS — M170 Bilateral primary osteoarthritis of knee: Secondary | ICD-10-CM | POA: Diagnosis not present

## 2017-06-24 NOTE — Progress Notes (Signed)
Infusion orders are current for patient CBC CMP Tylenol Benadryl appointments are up to date and follow up appointment  is scheduled TB gold not due yet.  

## 2017-06-30 ENCOUNTER — Encounter (HOSPITAL_COMMUNITY): Payer: Federal, State, Local not specified - PPO

## 2017-07-07 ENCOUNTER — Ambulatory Visit (HOSPITAL_COMMUNITY)
Admission: RE | Admit: 2017-07-07 | Discharge: 2017-07-07 | Disposition: A | Discharge status: Home / Self Care | Payer: Federal, State, Local not specified - PPO | Source: Ambulatory Visit | Attending: Rheumatology | Admitting: Rheumatology

## 2017-07-07 DIAGNOSIS — L4050 Arthropathic psoriasis, unspecified: Secondary | ICD-10-CM | POA: Diagnosis present

## 2017-07-07 MED ORDER — ACETAMINOPHEN 325 MG PO TABS: 650.0000 mg | ORAL_TABLET | ORAL | Status: DC

## 2017-07-07 MED ORDER — SODIUM CHLORIDE 0.9 % IV SOLN
5.0000 mg/kg | INTRAVENOUS | Status: DC
  Administered 2017-07-07: 12:00:00 400 mg via INTRAVENOUS
  Filled 2017-07-07: qty 40

## 2017-07-07 MED ORDER — DIPHENHYDRAMINE HCL 25 MG PO CAPS: 25.0000 mg | ORAL_CAPSULE | ORAL | Status: DC

## 2017-08-14 ENCOUNTER — Other Ambulatory Visit: Payer: Self-pay | Admitting: *Deleted

## 2017-08-14 NOTE — Progress Notes (Signed)
Infusion orders are current for patient CBC CMP Tylenol Benadryl appointments are up to date and follow up appointment  is scheduled TB gold is due an order placed.

## 2017-08-18 ENCOUNTER — Encounter (HOSPITAL_COMMUNITY)
Admission: RE | Admit: 2017-08-18 | Discharge: 2017-08-18 | Disposition: A | Discharge status: Home / Self Care | Payer: Federal, State, Local not specified - PPO | Source: Ambulatory Visit | Attending: Rheumatology | Admitting: Rheumatology

## 2017-08-18 DIAGNOSIS — L4050 Arthropathic psoriasis, unspecified: Secondary | ICD-10-CM | POA: Diagnosis not present

## 2017-08-18 LAB — COMPREHENSIVE METABOLIC PANEL
ALK PHOS: 75 U/L (ref 38–126)
ALT: 17 U/L (ref 14–54)
ANION GAP: 7 (ref 5–15)
AST: 22 U/L (ref 15–41)
Albumin: 3.4 g/dL — ABNORMAL LOW (ref 3.5–5.0)
BILIRUBIN TOTAL: 0.7 mg/dL (ref 0.3–1.2)
BUN: 11 mg/dL (ref 6–20)
CALCIUM: 8.8 mg/dL — AB (ref 8.9–10.3)
CO2: 23 mmol/L (ref 22–32)
Chloride: 106 mmol/L (ref 101–111)
Creatinine, Ser: 1.07 mg/dL — ABNORMAL HIGH (ref 0.44–1.00)
GFR calc non Af Amer: 52 mL/min — ABNORMAL LOW (ref >60)
Glucose, Bld: 189 mg/dL — ABNORMAL HIGH (ref 65–99)
Potassium: 3.9 mmol/L (ref 3.5–5.1)
Sodium: 136 mmol/L (ref 135–145)
TOTAL PROTEIN: 6.9 g/dL (ref 6.5–8.1)

## 2017-08-18 LAB — CBC
HEMATOCRIT: 36.5 % (ref 36.0–46.0)
HEMOGLOBIN: 12.2 g/dL (ref 12.0–15.0)
MCH: 29.1 pg (ref 26.0–34.0)
MCHC: 33.4 g/dL (ref 30.0–36.0)
MCV: 87.1 fL (ref 78.0–100.0)
Platelets: 258 10*3/uL (ref 150–400)
RBC: 4.19 MIL/uL (ref 3.87–5.11)
RDW: 13 % (ref 11.5–15.5)
WBC: 5.7 10*3/uL (ref 4.0–10.5)

## 2017-08-18 MED ORDER — SODIUM CHLORIDE 0.9 % IV SOLN
5.0000 mg/kg | INTRAVENOUS | Status: DC
  Administered 2017-08-18: 400 mg via INTRAVENOUS
  Filled 2017-08-18: qty 40

## 2017-08-18 MED ORDER — ACETAMINOPHEN 325 MG PO TABS: 650.0000 mg | ORAL_TABLET | ORAL | Status: DC

## 2017-08-18 MED ORDER — DIPHENHYDRAMINE HCL 25 MG PO CAPS: 25.0000 mg | ORAL_CAPSULE | ORAL | Status: DC

## 2017-08-18 NOTE — Progress Notes (Signed)
Labs are stable.

## 2017-08-23 LAB — QUANTIFERON-TB GOLD PLUS (RQFGPL)
QUANTIFERON MITOGEN VALUE: 0.75 [IU]/mL
QUANTIFERON NIL VALUE: 0.07 [IU]/mL
QuantiFERON TB1 Ag Value: 0.07 IU/mL
QuantiFERON TB2 Ag Value: 0.08 IU/mL

## 2017-08-23 LAB — QUANTIFERON-TB GOLD PLUS: QuantiFERON-TB Gold Plus: NEGATIVE

## 2017-09-08 ENCOUNTER — Other Ambulatory Visit: Payer: Self-pay | Admitting: Dermatology

## 2017-09-10 ENCOUNTER — Encounter: Payer: Self-pay | Admitting: Rheumatology

## 2017-09-11 NOTE — Telephone Encounter (Signed)
Please ask what type of reaction she experienced. Please ask if patient is continuing to have an allergic reaction or if it has resolved.  If the reaction has resolved, Dr. Estanislado Pandy is ok with the patient going for her next Remicade infusion.

## 2017-09-28 ENCOUNTER — Other Ambulatory Visit: Payer: Self-pay | Admitting: *Deleted

## 2017-09-28 NOTE — Progress Notes (Signed)
Infusion orders are current for patient CBC CMP Tylenol Benadryl appointments are up to date and follow up appointment  is scheduled TB gold not due yet.  

## 2017-09-29 ENCOUNTER — Ambulatory Visit (HOSPITAL_COMMUNITY)
Admission: RE | Admit: 2017-09-29 | Discharge: 2017-09-29 | Disposition: A | Discharge status: Home / Self Care | Payer: Federal, State, Local not specified - PPO | Source: Ambulatory Visit | Attending: Rheumatology | Admitting: Rheumatology

## 2017-09-29 DIAGNOSIS — L4050 Arthropathic psoriasis, unspecified: Secondary | ICD-10-CM | POA: Diagnosis not present

## 2017-09-29 MED ORDER — SODIUM CHLORIDE 0.9 % IV SOLN
5.0000 mg/kg | INTRAVENOUS | Status: DC
  Administered 2017-09-29: 400 mg via INTRAVENOUS
  Filled 2017-09-29: qty 40

## 2017-09-29 MED ORDER — ACETAMINOPHEN 325 MG PO TABS: 650.0000 mg | ORAL_TABLET | ORAL | Status: DC

## 2017-09-29 MED ORDER — DIPHENHYDRAMINE HCL 25 MG PO CAPS: 25.0000 mg | ORAL_CAPSULE | ORAL | Status: DC

## 2017-09-29 MED ORDER — SODIUM CHLORIDE 0.9 % IV SOLN
INTRAVENOUS | Status: DC
  Administered 2017-09-29: 11:00:00 via INTRAVENOUS

## 2017-10-03 ENCOUNTER — Other Ambulatory Visit: Payer: Self-pay | Admitting: Rheumatology

## 2017-10-05 NOTE — Telephone Encounter (Signed)
Last Visit: 06/24/17 Next Visit: 11/25/17 Labs: 08/18/17 stable  Okay to refill per Dr. Estanislado Pandy

## 2017-10-10 ENCOUNTER — Other Ambulatory Visit: Payer: Self-pay | Admitting: Rheumatology

## 2017-10-12 NOTE — Telephone Encounter (Signed)
Last visit: 06/24/2017 Next visit: 11/25/2017  Okay to refill per Dr. Estanislado Pandy.

## 2017-10-29 ENCOUNTER — Other Ambulatory Visit: Payer: Self-pay | Admitting: *Deleted

## 2017-10-29 DIAGNOSIS — L4050 Arthropathic psoriasis, unspecified: Secondary | ICD-10-CM

## 2017-11-05 ENCOUNTER — Other Ambulatory Visit: Payer: Self-pay | Admitting: Family Medicine

## 2017-11-05 DIAGNOSIS — Z1231 Encounter for screening mammogram for malignant neoplasm of breast: Secondary | ICD-10-CM

## 2017-11-06 ENCOUNTER — Other Ambulatory Visit (HOSPITAL_COMMUNITY): Payer: Self-pay | Admitting: *Deleted

## 2017-11-10 ENCOUNTER — Ambulatory Visit (HOSPITAL_COMMUNITY)
Admission: RE | Admit: 2017-11-10 | Discharge: 2017-11-10 | Disposition: A | Discharge status: Home / Self Care | Payer: Federal, State, Local not specified - PPO | Source: Ambulatory Visit | Attending: Rheumatology | Admitting: Rheumatology

## 2017-11-10 DIAGNOSIS — L4050 Arthropathic psoriasis, unspecified: Secondary | ICD-10-CM | POA: Diagnosis present

## 2017-11-10 LAB — COMPREHENSIVE METABOLIC PANEL
ALK PHOS: 69 U/L (ref 38–126)
ALT: 15 U/L (ref 0–44)
ANION GAP: 9 (ref 5–15)
AST: 21 U/L (ref 15–41)
Albumin: 3.4 g/dL — ABNORMAL LOW (ref 3.5–5.0)
BUN: 17 mg/dL (ref 8–23)
CALCIUM: 9 mg/dL (ref 8.9–10.3)
CO2: 21 mmol/L — ABNORMAL LOW (ref 22–32)
CREATININE: 1.03 mg/dL — AB (ref 0.44–1.00)
Chloride: 107 mmol/L (ref 98–111)
GFR, EST NON AFRICAN AMERICAN: 55 mL/min — AB (ref >60)
Glucose, Bld: 151 mg/dL — ABNORMAL HIGH (ref 70–99)
Potassium: 4.1 mmol/L (ref 3.5–5.1)
Sodium: 137 mmol/L (ref 135–145)
TOTAL PROTEIN: 6.9 g/dL (ref 6.5–8.1)
Total Bilirubin: 0.5 mg/dL (ref 0.3–1.2)

## 2017-11-10 LAB — CBC
HCT: 36.2 % (ref 36.0–46.0)
HEMOGLOBIN: 11.8 g/dL — AB (ref 12.0–15.0)
MCH: 29.1 pg (ref 26.0–34.0)
MCHC: 32.6 g/dL (ref 30.0–36.0)
MCV: 89.2 fL (ref 78.0–100.0)
Platelets: 253 10*3/uL (ref 150–400)
RBC: 4.06 MIL/uL (ref 3.87–5.11)
RDW: 13.3 % (ref 11.5–15.5)
WBC: 5.5 10*3/uL (ref 4.0–10.5)

## 2017-11-10 MED ORDER — DIPHENHYDRAMINE HCL 25 MG PO CAPS: 25.0000 mg | ORAL_CAPSULE | ORAL | Status: DC

## 2017-11-10 MED ORDER — SODIUM CHLORIDE 0.9 % IV SOLN
5.0000 mg/kg | INTRAVENOUS | Status: DC
  Administered 2017-11-10: 400 mg via INTRAVENOUS
  Filled 2017-11-10: qty 40

## 2017-11-10 MED ORDER — ACETAMINOPHEN 325 MG PO TABS: 650.0000 mg | ORAL_TABLET | ORAL | Status: DC

## 2017-11-10 NOTE — Progress Notes (Signed)
Mild anemia

## 2017-11-11 NOTE — Progress Notes (Signed)
Office Visit Note  Patient: Morgan Jensen             Date of Birth: 02/05/1951           MRN: [id number]             PCP: Maurice Small, MD Referring: Maurice Small, MD Visit Date: 11/25/2017 Occupation: @GUAROCC @  Subjective:  Medication management.   History of Present Illness: CHENNEL OLIVOS is a 67 y.o. female with history of psoriatic arthritis and psoriasis.  She has been on methotrexate and Remicade combination.  She denies any joint pain or joint swelling.  She has noticed decreased range of motion of her left thumb PIP joint.  She denies any joint swelling.  She denies any psoriasis rash.  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  She has been given a topical agent to use.  Activities of Daily Living:  Patient reports morning stiffness for 0 minutes.   Patient Denies nocturnal pain.  Difficulty dressing/grooming: Denies Difficulty climbing stairs: Denies Difficulty getting out of chair: Denies Difficulty using hands for taps, buttons, cutlery, and/or writing: Denies  Review of Systems  Constitutional: Negative for fatigue.  HENT: Negative for mouth sores, trouble swallowing, trouble swallowing and mouth dryness.   Eyes: Negative for pain and dryness.  Respiratory: Negative for cough, shortness of breath, wheezing and difficulty breathing.   Cardiovascular: Negative for chest pain and swelling in legs/feet.  Gastrointestinal: Negative for abdominal pain, constipation, diarrhea, nausea and vomiting.  Endocrine: Negative for increased urination.  Genitourinary: Negative for nocturia and pelvic pain.  Musculoskeletal: Positive for arthralgias and joint pain. Negative for joint swelling and morning stiffness.  Skin: Positive for rash. Negative for hair loss.  Allergic/Immunologic: Negative for susceptible to infections.  Neurological: Negative for dizziness, light-headedness, headaches, memory loss and weakness.  Hematological: Negative for  bruising/bleeding tendency.  Psychiatric/Behavioral: Negative for depressed mood and confusion. The patient is not nervous/anxious.     PMFS History:  Patient Active Problem List   Diagnosis Date Noted  . Osteoarthritis of knee 01/18/2016  . Hypertension 01/18/2016  . Anxiety 01/18/2016  . Elevated cholesterol 01/18/2016  . CKD (chronic kidney disease) 01/18/2016  . Psoriatic arthritis ([place]) 10/27/2015  . Other psoriasis 10/27/2015    Past Medical History:  Diagnosis Date  . Anxiety 01/18/2016  . Arthritis    psoriatic arthritis   . CKD (chronic kidney disease) 01/18/2016  . Elevated cholesterol 01/18/2016  . Hypertension 01/18/2016  . Osteoarthritis of knee 01/18/2016  . Psoriasis     Family History  Problem Relation Age of Onset  . Aneurysm Mother   . Cancer Father        Lung Cancer  . Cancer Brother        Esopheageal Cancer   Past Surgical History:  Procedure Laterality Date  . BREAST EXCISIONAL BIOPSY Right 1986  . BREAST SURGERY Right 1985   Benign Tumor removed   . LEG SURGERY Left 2004   Fibula and Tibia Rod placement   . TUBAL LIGATION  1993   Social History   Social History Narrative  . Not on file    Objective: Vital Signs: BP 127/71 (BP Location: Left Arm, Patient Position: Sitting, Cuff Size: Normal)   Pulse 75   Resp 12   Ht 5\' 3"  (1.6 m)   Wt 165 lb (74.8 kg)   BMI 29.23 kg/m    Physical Exam  Constitutional: She is oriented to person,  place, and time. She appears well-developed and well-nourished.  HENT:  Head: Normocephalic and atraumatic.  Eyes: Conjunctivae and EOM are normal.  Neck: Normal range of motion.  Cardiovascular: Normal rate, regular rhythm, normal heart sounds and intact distal pulses.  Pulmonary/Chest: Effort normal and breath sounds normal.  Abdominal: Soft. Bowel sounds are normal.  Lymphadenopathy:    She has no cervical adenopathy.  Neurological: She is alert and oriented to person, place, and time.  Skin: Skin  is warm and dry. Capillary refill takes less than 2 seconds. Rash noted.  Erythematous papular rash on lower extremities diagnosed as granuloma annulare   Psychiatric: She has a normal mood and affect. Her behavior is normal.  Nursing note and vitals reviewed.    Musculoskeletal Exam: C-spine thoracic lumbar spine good range of motion.  She has some tenderness over left SI joint.  Shoulder joints elbow joints wrist joint MCPs PIPs DIPs were in good range of motion with no synovitis.  Hip joints knee joints ankles MTPs PIPs DIPs were in good range of motion with no synovitis.  He has some osteoarthritic changes in her bilateral first MTPs.  CDAI Exam: CDAI Score: 0.2  Patient Global Assessment: 1 (mm); Provider Global Assessment: 1 (mm) Swollen: 0 ; Tender: 0  Joint Exam   Not documented   There is currently no information documented on the homunculus. Go to the Rheumatology activity and complete the homunculus joint exam.  Investigation: No additional findings.  Imaging: No results found.  Recent Labs: Lab Results  Component Value Date   WBC 5.5 11/10/2017   HGB 11.8 (L) 11/10/2017   PLT 253 11/10/2017   NA 137 11/10/2017   K 4.1 11/10/2017   CL 107 11/10/2017   CO2 21 (L) 11/10/2017   GLUCOSE 151 (H) 11/10/2017   BUN 17 11/10/2017   CREATININE 1.03 (H) 11/10/2017   BILITOT 0.5 11/10/2017   ALKPHOS 69 11/10/2017   AST 21 11/10/2017   ALT 15 11/10/2017   PROT 6.9 11/10/2017   ALBUMIN 3.4 (L) 11/10/2017   CALCIUM 9.0 11/10/2017   GFRAA >60 11/10/2017   QFTBGOLD Negative 07/22/2016   QFTBGOLDPLUS Negative 08/18/2017    Speciality Comments: Remicade 5mg /kg every 6 weeks TB gold negative 07/22/16  Procedures:  No procedures performed Allergies: Patient has no known allergies.   Assessment / Plan:     Visit Diagnoses: Psoriatic arthritis (HCC)-patient had no active synovitis on examination today.  She is doing well on combination of methotrexate and Remicade.  I  discussed spacing Remicade to every 8 weeks.  She believes that she does feel some joint discomfort prior to her next infusion.  We will continue with the current regimen.  Other psoriasis-she has no active psoriasis lesions.  High risk medication use -  MTX 3 tablets p.o. weekly, Remicade 2milligram per kilogram every 6 weeks, folic acid 1 mg po qd. her labs have been stable.  Her GFR is better now.  Sacroiliitis (HCC)-she was having some left SI joint pain.  Stretching exercises were discussed.  I offered cortisone which she declined.  Primary osteoarthritis of both knees-she is currently not having much discomfort in her knee joints.  History of chronic kidney disease-GFR is a stable.  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  History of hypertension-her blood pressure is controlled.  History of hypercholesterolemia  Vitamin D deficiency-she is on supplement.  She states her bone density is done by her PCP.  History of anxiety   Orders: No orders of the defined types were placed in this encounter.  No orders of the defined types were placed in this encounter.   Face-to-face time spent with patient was 30 minutes. Greater than 50% of time was spent in counseling and coordination of care.  Follow-Up Instructions: Return in about 5 months (around 04/27/2018) for Psoriatic arthritis.   Bo Merino, MD  Note - This record has been created using Editor, commissioning.  Chart creation errors have been sought, but may not always  have been located. Such creation errors do not reflect on  the standard of medical care.

## 2017-11-25 ENCOUNTER — Ambulatory Visit (INDEPENDENT_AMBULATORY_CARE_PROVIDER_SITE_OTHER): Payer: Federal, State, Local not specified - PPO | Admitting: Rheumatology

## 2017-11-25 ENCOUNTER — Other Ambulatory Visit: Payer: Self-pay | Admitting: *Deleted

## 2017-11-25 ENCOUNTER — Encounter: Payer: Self-pay | Admitting: Rheumatology

## 2017-11-25 VITALS — BP 127/71 | HR 75 | Resp 12 | Ht 63.0 in | Wt 165.0 lb

## 2017-11-25 DIAGNOSIS — Z87448 Personal history of other diseases of urinary system: Secondary | ICD-10-CM

## 2017-11-25 DIAGNOSIS — M170 Bilateral primary osteoarthritis of knee: Secondary | ICD-10-CM

## 2017-11-25 DIAGNOSIS — Z8639 Personal history of other endocrine, nutritional and metabolic disease: Secondary | ICD-10-CM

## 2017-11-25 DIAGNOSIS — L4050 Arthropathic psoriasis, unspecified: Secondary | ICD-10-CM | POA: Diagnosis not present

## 2017-11-25 DIAGNOSIS — M461 Sacroiliitis, not elsewhere classified: Secondary | ICD-10-CM

## 2017-11-25 DIAGNOSIS — L920 Granuloma annulare: Secondary | ICD-10-CM

## 2017-11-25 DIAGNOSIS — L408 Other psoriasis: Secondary | ICD-10-CM | POA: Diagnosis not present

## 2017-11-25 DIAGNOSIS — E559 Vitamin D deficiency, unspecified: Secondary | ICD-10-CM

## 2017-11-25 DIAGNOSIS — Z79899 Other long term (current) drug therapy: Secondary | ICD-10-CM

## 2017-11-25 DIAGNOSIS — Z8659 Personal history of other mental and behavioral disorders: Secondary | ICD-10-CM

## 2017-11-25 DIAGNOSIS — Z8679 Personal history of other diseases of the circulatory system: Secondary | ICD-10-CM

## 2017-11-25 NOTE — Progress Notes (Signed)
Infusion orders are current for patient CBC CMP Tylenol Benadryl appointments are up to date and follow up appointment  is scheduled TB gold not due yet.  

## 2017-12-08 ENCOUNTER — Ambulatory Visit
Admission: RE | Admit: 2017-12-08 | Discharge: 2017-12-08 | Disposition: A | Discharge status: Home / Self Care | Payer: Federal, State, Local not specified - PPO | Source: Ambulatory Visit | Attending: Family Medicine | Admitting: Family Medicine

## 2017-12-08 DIAGNOSIS — Z1231 Encounter for screening mammogram for malignant neoplasm of breast: Secondary | ICD-10-CM

## 2017-12-20 ENCOUNTER — Other Ambulatory Visit: Payer: Self-pay | Admitting: Rheumatology

## 2017-12-21 NOTE — Telephone Encounter (Signed)
Last Visit: 11/25/17 Next Visit: 04/27/18  Okay to refill per Dr. Estanislado Pandy

## 2017-12-22 ENCOUNTER — Encounter (HOSPITAL_COMMUNITY)
Admission: RE | Admit: 2017-12-22 | Discharge: 2017-12-22 | Disposition: A | Discharge status: Home / Self Care | Payer: Federal, State, Local not specified - PPO | Source: Ambulatory Visit | Attending: Rheumatology | Admitting: Rheumatology

## 2017-12-22 DIAGNOSIS — L4050 Arthropathic psoriasis, unspecified: Secondary | ICD-10-CM | POA: Diagnosis present

## 2017-12-22 MED ORDER — SODIUM CHLORIDE 0.9 % IV SOLN
5.0000 mg/kg | INTRAVENOUS | Status: DC
  Administered 2017-12-22: 400 mg via INTRAVENOUS
  Filled 2017-12-22: qty 40

## 2017-12-22 MED ORDER — ACETAMINOPHEN 325 MG PO TABS: 650.0000 mg | ORAL_TABLET | ORAL | Status: DC

## 2017-12-22 MED ORDER — DIPHENHYDRAMINE HCL 25 MG PO CAPS: 25.0000 mg | ORAL_CAPSULE | ORAL | Status: DC

## 2017-12-25 ENCOUNTER — Other Ambulatory Visit: Payer: Self-pay | Admitting: Rheumatology

## 2017-12-25 NOTE — Telephone Encounter (Signed)
Last Visit: 11/25/17 Next Visit: 04/27/18 Labs: 11/10/17 Mild anemia  Okay to refill per Dr. Estanislado Pandy

## 2018-01-22 ENCOUNTER — Other Ambulatory Visit: Payer: Self-pay | Admitting: *Deleted

## 2018-01-22 NOTE — Progress Notes (Signed)
Infusion orders are current for patient CBC CMP Tylenol Benadryl appointments are up to date and follow up appointment  is scheduled TB gold not due yet.  

## 2018-02-02 ENCOUNTER — Ambulatory Visit (HOSPITAL_COMMUNITY)
Admission: RE | Admit: 2018-02-02 | Discharge: 2018-02-02 | Disposition: A | Discharge status: Home / Self Care | Payer: Federal, State, Local not specified - PPO | Source: Ambulatory Visit | Attending: Rheumatology | Admitting: Rheumatology

## 2018-02-02 DIAGNOSIS — L4050 Arthropathic psoriasis, unspecified: Secondary | ICD-10-CM | POA: Diagnosis not present

## 2018-02-02 LAB — CBC
HEMATOCRIT: 37.6 % (ref 36.0–46.0)
HEMOGLOBIN: 12.2 g/dL (ref 12.0–15.0)
MCH: 29.3 pg (ref 26.0–34.0)
MCHC: 32.4 g/dL (ref 30.0–36.0)
MCV: 90.4 fL (ref 80.0–100.0)
NRBC: 0 % (ref 0.0–0.2)
Platelets: 253 10*3/uL (ref 150–400)
RBC: 4.16 MIL/uL (ref 3.87–5.11)
RDW: 13 % (ref 11.5–15.5)
WBC: 6.2 10*3/uL (ref 4.0–10.5)

## 2018-02-02 LAB — COMPREHENSIVE METABOLIC PANEL
ALBUMIN: 3.6 g/dL (ref 3.5–5.0)
ALK PHOS: 62 U/L (ref 38–126)
ALT: 17 U/L (ref 0–44)
AST: 25 U/L (ref 15–41)
Anion gap: 8 (ref 5–15)
BILIRUBIN TOTAL: 0.7 mg/dL (ref 0.3–1.2)
BUN: 13 mg/dL (ref 8–23)
CALCIUM: 9.1 mg/dL (ref 8.9–10.3)
CO2: 22 mmol/L (ref 22–32)
Chloride: 107 mmol/L (ref 98–111)
Creatinine, Ser: 1.26 mg/dL — ABNORMAL HIGH (ref 0.44–1.00)
GFR calc Af Amer: 51 mL/min — ABNORMAL LOW (ref >60)
GFR calc non Af Amer: 44 mL/min — ABNORMAL LOW (ref >60)
Glucose, Bld: 190 mg/dL — ABNORMAL HIGH (ref 70–99)
Potassium: 3.8 mmol/L (ref 3.5–5.1)
SODIUM: 137 mmol/L (ref 135–145)
TOTAL PROTEIN: 7.5 g/dL (ref 6.5–8.1)

## 2018-02-02 MED ORDER — SODIUM CHLORIDE 0.9 % IV SOLN
5.0000 mg/kg | INTRAVENOUS | Status: DC
  Administered 2018-02-02: 400 mg via INTRAVENOUS
  Filled 2018-02-02: qty 40

## 2018-02-02 MED ORDER — DIPHENHYDRAMINE HCL 25 MG PO CAPS: 25.0000 mg | ORAL_CAPSULE | ORAL | Status: DC

## 2018-02-02 MED ORDER — ACETAMINOPHEN 325 MG PO TABS: 650.0000 mg | ORAL_TABLET | ORAL | Status: DC

## 2018-02-02 NOTE — Progress Notes (Signed)
Patient's creatinine is up and LFTs are elevated.  She is on methotrexate 3 tablets/week.  I would recommend reducing methotrexate to 2 tablets/week.  LFTs could be elevated due to his statin use.  Please forward lab results to her PCP.

## 2018-02-10 ENCOUNTER — Other Ambulatory Visit: Payer: Self-pay | Admitting: Dermatology

## 2018-02-17 ENCOUNTER — Telehealth: Payer: Self-pay | Admitting: Pharmacy Technician

## 2018-02-17 NOTE — Telephone Encounter (Signed)
Left message for patient to see if she is expecting any insurance changes for 2020. Patient is receiving infusions that may require a pre-certification.

## 2018-03-15 ENCOUNTER — Other Ambulatory Visit: Payer: Self-pay | Admitting: *Deleted

## 2018-03-15 DIAGNOSIS — L4050 Arthropathic psoriasis, unspecified: Secondary | ICD-10-CM

## 2018-03-16 ENCOUNTER — Ambulatory Visit (HOSPITAL_COMMUNITY)
Admission: RE | Admit: 2018-03-16 | Discharge: 2018-03-16 | Disposition: A | Discharge status: Home / Self Care | Payer: Federal, State, Local not specified - PPO | Source: Ambulatory Visit | Attending: Rheumatology | Admitting: Rheumatology

## 2018-03-16 DIAGNOSIS — L4050 Arthropathic psoriasis, unspecified: Secondary | ICD-10-CM | POA: Insufficient documentation

## 2018-03-16 LAB — COMPREHENSIVE METABOLIC PANEL
ALT: 20 U/L (ref 0–44)
ANION GAP: 7 (ref 5–15)
AST: 20 U/L (ref 15–41)
Albumin: 3.4 g/dL — ABNORMAL LOW (ref 3.5–5.0)
Alkaline Phosphatase: 60 U/L (ref 38–126)
BUN: 12 mg/dL (ref 8–23)
CO2: 21 mmol/L — ABNORMAL LOW (ref 22–32)
Calcium: 9 mg/dL (ref 8.9–10.3)
Chloride: 107 mmol/L (ref 98–111)
Creatinine, Ser: 1.06 mg/dL — ABNORMAL HIGH (ref 0.44–1.00)
GFR calc Af Amer: >60 mL/min (ref >60)
GFR calc non Af Amer: 54 mL/min — ABNORMAL LOW (ref >60)
Glucose, Bld: 137 mg/dL — ABNORMAL HIGH (ref 70–99)
Potassium: 3.8 mmol/L (ref 3.5–5.1)
Sodium: 135 mmol/L (ref 135–145)
Total Bilirubin: 0.7 mg/dL (ref 0.3–1.2)
Total Protein: 7 g/dL (ref 6.5–8.1)

## 2018-03-16 LAB — CBC
HCT: 34.9 % — ABNORMAL LOW (ref 36.0–46.0)
Hemoglobin: 11.8 g/dL — ABNORMAL LOW (ref 12.0–15.0)
MCH: 30.4 pg (ref 26.0–34.0)
MCHC: 33.8 g/dL (ref 30.0–36.0)
MCV: 89.9 fL (ref 80.0–100.0)
Platelets: 249 10*3/uL (ref 150–400)
RBC: 3.88 MIL/uL (ref 3.87–5.11)
RDW: 12.8 % (ref 11.5–15.5)
WBC: 6.5 10*3/uL (ref 4.0–10.5)
nRBC: 0 % (ref 0.0–0.2)

## 2018-03-16 MED ORDER — SODIUM CHLORIDE 0.9 % IV SOLN
5.0000 mg/kg | INTRAVENOUS | Status: DC
  Administered 2018-03-16: 400 mg via INTRAVENOUS
  Filled 2018-03-16: qty 40

## 2018-03-16 MED ORDER — ACETAMINOPHEN 325 MG PO TABS: 650.0000 mg | ORAL_TABLET | ORAL | Status: DC

## 2018-03-16 MED ORDER — DIPHENHYDRAMINE HCL 25 MG PO CAPS: 25.0000 mg | ORAL_CAPSULE | ORAL | Status: DC

## 2018-03-16 NOTE — Progress Notes (Signed)
GFR is low.  Labs are stable.  Please forward labs to her PCP.

## 2018-03-17 NOTE — Telephone Encounter (Signed)
Submitted Benefits Investigation through [employer]:  [employer] plans will not change for 2020. [employer] is active and no Pre-cert is required. Patient has 15% coinsurance.  Left message for patient to see if she contacted Alphonsa Overall to renew copay assistance for 2020.  8:55 AM Beatriz Chancellor, CPhT

## 2018-03-19 ENCOUNTER — Other Ambulatory Visit: Payer: Self-pay | Admitting: Rheumatology

## 2018-03-19 NOTE — Telephone Encounter (Signed)
Last Visit: 11/25/17 Next Visit: 04/27/18 Labs: 03/16/18 GFR is low. Labs are stable.   Okay to refill per Dr. Estanislado Pandy

## 2018-04-13 NOTE — Progress Notes (Signed)
Office Visit Note  Patient: Morgan Jensen             Date of Birth: 1950/04/20           MRN: [id number]             PCP: Maurice Small, MD Referring: Maurice Small, MD Visit Date: 04/27/2018 Occupation: @GUAROCC @  Subjective:  Medication monitoring   History of Present Illness: Morgan Jensen is a 68 y.o. female with history of psoriatic arthritis and osteoarthritis.  She is taking MTX 2 tablets po once weekly, Remicade 5 mg/kg every 6 weeks, and folic acid 1 mg po daily.  She denies any recent flares.  She denies any joint pain or joint swelling at this time.  She denies any SI joint pain.  She states that occasionally if she is vacuuming or mopping she will have increased discomfort in her SI joints.  She denies any Achilles tendinitis or plantar fasciitis at this time.  She reports that she does not notice any difference since decreasing methotrexate from 3 tablets once weekly to 2 tablets once weekly.  She states that both knee joints are doing well.  She denies any active psoriasis at this time.   Activities of Daily Living:  Patient reports morning stiffness for 0 NONE.   Patient Denies nocturnal pain.  Difficulty dressing/grooming: Denies Difficulty climbing stairs: Denies Difficulty getting out of chair: Denies Difficulty using hands for taps, buttons, cutlery, and/or writing: Denies  Review of Systems  Constitutional: Negative for fatigue.  HENT: Negative for mouth sores, mouth dryness and nose dryness.   Eyes: Positive for dryness. Negative for pain and visual disturbance.  Respiratory: Negative for cough, hemoptysis, shortness of breath and difficulty breathing.   Cardiovascular: Negative for chest pain, palpitations, hypertension and swelling in legs/feet.  Gastrointestinal: Negative for blood in stool, constipation and diarrhea.  Endocrine: Negative for excessive thirst and increased urination.  Genitourinary: Negative for difficulty urinating and painful urination.    Musculoskeletal: Negative for arthralgias, joint pain, joint swelling, myalgias, muscle weakness, morning stiffness, muscle tenderness and myalgias.  Skin: Negative for color change, pallor, rash, hair loss, nodules/bumps, skin tightness, ulcers and sensitivity to sunlight.  Allergic/Immunologic: Negative for susceptible to infections.  Neurological: Negative for dizziness, numbness, headaches and weakness.  Hematological: Negative for bruising/bleeding tendency and swollen glands.  Psychiatric/Behavioral: Negative for depressed mood and sleep disturbance. The patient is not nervous/anxious.     PMFS History:  Patient Active Problem List   Diagnosis Date Noted  . Osteoarthritis of knee 01/18/2016  . Hypertension 01/18/2016  . Anxiety 01/18/2016  . Elevated cholesterol 01/18/2016  . CKD (chronic kidney disease) 01/18/2016  . Psoriatic arthritis ([place]) 10/27/2015  . Other psoriasis 10/27/2015    Past Medical History:  Diagnosis Date  . Anxiety 01/18/2016  . Arthritis    psoriatic arthritis   . CKD (chronic kidney disease) 01/18/2016  . Elevated cholesterol 01/18/2016  . Hypertension 01/18/2016  . Osteoarthritis of knee 01/18/2016  . Psoriasis     Family History  Problem Relation Age of Onset  . Aneurysm Mother   . Cancer Father        Lung Cancer  . Cancer Brother        Esopheageal Cancer   Past Surgical History:  Procedure Laterality Date  . BREAST EXCISIONAL BIOPSY Right 1986  . BREAST SURGERY Right 1985   Benign Tumor removed   . LEG SURGERY Left 2004   Fibula and Tibia Rod placement   .  TUBAL LIGATION  1993   Social History   Social History Narrative  . Not on file   Immunization History  Administered Date(s) Administered  . Zoster Recombinat (Shingrix) 06/24/2017, 08/26/2017     Objective: Vital Signs: BP 119/65 (BP Location: Left Arm, Patient Position: Sitting, Cuff Size: Normal)   Pulse 73   Resp 16   Ht 5\' 3"  (1.6 m)   Wt 168 lb (76.2 kg)   BMI  29.76 kg/m    Physical Exam Vitals signs and nursing note reviewed.  Constitutional:      Appearance: She is well-developed.  HENT:     Head: Normocephalic and atraumatic.  Eyes:     Conjunctiva/sclera: Conjunctivae normal.  Neck:     Musculoskeletal: Normal range of motion.  Cardiovascular:     Rate and Rhythm: Normal rate and regular rhythm.     Heart sounds: Normal heart sounds.  Pulmonary:     Effort: Pulmonary effort is normal.     Breath sounds: Normal breath sounds.  Abdominal:     General: Bowel sounds are normal.     Palpations: Abdomen is soft.  Lymphadenopathy:     Cervical: No cervical adenopathy.  Skin:    General: Skin is warm and dry.     Capillary Refill: Capillary refill takes less than 2 seconds.  Neurological:     Mental Status: She is alert and oriented to person, place, and time.  Psychiatric:        Behavior: Behavior normal.      Musculoskeletal Exam: C-spine, thoracic spine, lumbar spine good range of motion.  No midline spinal tenderness.  No SI joint tenderness.  Shoulder joints, elbow joints, wrist joints, MCPs, PIPs, DIPs good range of motion with no synovitis.  She has complete fist formation bilaterally.  Hip joints, knee joints, ankle joints, MTPs, PIPs, DIPs good range of motion no synovitis.  No warmth or effusion bilateral knee joints.  No tenderness or swelling of ankle joints.  No Achilles tenderness or plantar fasciitis.  No tenderness over trochanter bursa bilaterally.  CDAI Exam: CDAI Score: 0.2  Patient Global Assessment: 1 (mm); Provider Global Assessment: 1 (mm) Swollen: 0 ; Tender: 0  Joint Exam   Not documented   There is currently no information documented on the homunculus. Go to the Rheumatology activity and complete the homunculus joint exam.  Investigation: No additional findings.  Imaging: No results found.  Recent Labs: Lab Results  Component Value Date   WBC 6.5 03/16/2018   HGB 11.8 (L) 03/16/2018   PLT 249  03/16/2018   NA 135 03/16/2018   K 3.8 03/16/2018   CL 107 03/16/2018   CO2 21 (L) 03/16/2018   GLUCOSE 137 (H) 03/16/2018   BUN 12 03/16/2018   CREATININE 1.06 (H) 03/16/2018   BILITOT 0.7 03/16/2018   ALKPHOS 60 03/16/2018   AST 20 03/16/2018   ALT 20 03/16/2018   PROT 7.0 03/16/2018   ALBUMIN 3.4 (L) 03/16/2018   CALCIUM 9.0 03/16/2018   GFRAA >60 03/16/2018   QFTBGOLD Negative 07/22/2016   QFTBGOLDPLUS Negative 08/18/2017    Speciality Comments: Remicade 5mg /kg every 6 weeks TB gold negative 07/22/16  Procedures:  No procedures performed Allergies: Patient has no known allergies.      Assessment / Plan:     Visit Diagnoses: Psoriatic arthritis ([place]): She has no synovitis or dactylitis on exam.  She has no joint pain or joint swelling at this time.  She has no morning stiffness.  She has not had any recent psoriatic arthritis flares.  She is clinically doing well on methotrexate 2 tablets by mouth once weekly, folic acid 1 mg by mouth daily, and Remicade IV infusions 5 mg/kg every 6 weeks.  Her last infusion was 03/16/2018.  She has no SI joint tenderness on exam.  She has no Achilles tendinitis or plantar fasciitis.  She will continue on the current treatment regimen.  She does not need any refills at this time.  She will follow up in 5 months.   Other psoriasis  High risk medication use - MTX 2 tablets p.o. weekly, Remicade 5 mg/kg every 6 weeks, folic acid 1 mg po qd. CBC and CMP were drawn on 03/16/2018.  She does lab work performed with infusions.  TB gold was negative on 08/18/2017.  Standing orders were placed today.  A future order for TB gold was also placed.  She has not had any recent infections.  She received the annual influenza vaccination.  Sacroiliitis ([place]): She has no SI joint tenderness on exam.  Primary osteoarthritis of both knees: Good range of motion with no discomfort.  No warmth or effusion noted.  She has not had any discomfort in her knee joints  recently.  Other medical conditions are listed as follows:  History of chronic kidney disease  Granuloma annulare  History of hypertension  History of hypercholesterolemia  Vitamin D deficiency  History of anxiety   Orders: Orders Placed This Encounter  Procedures  . CBC with Differential/Platelet  . COMPLETE METABOLIC PANEL WITH GFR  . QuantiFERON-TB Gold Plus   No orders of the defined types were placed in this encounter.    Follow-Up Instructions: Return in about 5 months (around 09/25/2018) for Psoriatic arthritis, Osteoarthritis.   Ofilia Neas, PA-C  Note - This record has been created using Dragon software.  Chart creation errors have been sought, but may not always  have been located. Such creation errors do not reflect on  the standard of medical care.

## 2018-04-23 ENCOUNTER — Other Ambulatory Visit: Payer: Self-pay | Admitting: *Deleted

## 2018-04-23 NOTE — Progress Notes (Signed)
Infusion orders are current for patient CBC CMP Tylenol Benadryl appointments are up to date and follow up appointment  is scheduled TB gold not due yet.  

## 2018-04-27 ENCOUNTER — Ambulatory Visit (HOSPITAL_COMMUNITY)
Admission: RE | Admit: 2018-04-27 | Discharge: 2018-04-27 | Disposition: A | Discharge status: Home / Self Care | Payer: Federal, State, Local not specified - PPO | Source: Ambulatory Visit | Attending: Rheumatology | Admitting: Rheumatology

## 2018-04-27 ENCOUNTER — Encounter: Payer: Self-pay | Admitting: Physician Assistant

## 2018-04-27 ENCOUNTER — Ambulatory Visit: Payer: Federal, State, Local not specified - PPO | Admitting: Physician Assistant

## 2018-04-27 VITALS — BP 119/65 | HR 73 | Resp 16 | Ht 63.0 in | Wt 168.0 lb

## 2018-04-27 DIAGNOSIS — Z8659 Personal history of other mental and behavioral disorders: Secondary | ICD-10-CM

## 2018-04-27 DIAGNOSIS — M461 Sacroiliitis, not elsewhere classified: Secondary | ICD-10-CM | POA: Diagnosis not present

## 2018-04-27 DIAGNOSIS — L408 Other psoriasis: Secondary | ICD-10-CM

## 2018-04-27 DIAGNOSIS — Z79899 Other long term (current) drug therapy: Secondary | ICD-10-CM

## 2018-04-27 DIAGNOSIS — Z8639 Personal history of other endocrine, nutritional and metabolic disease: Secondary | ICD-10-CM

## 2018-04-27 DIAGNOSIS — L4050 Arthropathic psoriasis, unspecified: Secondary | ICD-10-CM | POA: Diagnosis not present

## 2018-04-27 DIAGNOSIS — L920 Granuloma annulare: Secondary | ICD-10-CM

## 2018-04-27 DIAGNOSIS — E559 Vitamin D deficiency, unspecified: Secondary | ICD-10-CM

## 2018-04-27 DIAGNOSIS — Z87448 Personal history of other diseases of urinary system: Secondary | ICD-10-CM

## 2018-04-27 DIAGNOSIS — M170 Bilateral primary osteoarthritis of knee: Secondary | ICD-10-CM

## 2018-04-27 DIAGNOSIS — Z8679 Personal history of other diseases of the circulatory system: Secondary | ICD-10-CM

## 2018-04-27 MED ORDER — ACETAMINOPHEN 325 MG PO TABS
650.0000 mg | ORAL_TABLET | ORAL | Status: DC
  Administered 2018-04-27: 650 mg via ORAL

## 2018-04-27 MED ORDER — DIPHENHYDRAMINE HCL 25 MG PO CAPS
ORAL_CAPSULE | ORAL | Status: AC
  Administered 2018-04-27: 25 mg via ORAL
  Filled 2018-04-27: qty 1

## 2018-04-27 MED ORDER — SODIUM CHLORIDE 0.9 % IV SOLN
5.0000 mg/kg | INTRAVENOUS | Status: DC
  Administered 2018-04-27: 400 mg via INTRAVENOUS
  Filled 2018-04-27: qty 40

## 2018-04-27 MED ORDER — ACETAMINOPHEN 325 MG PO TABS
ORAL_TABLET | ORAL | Status: AC
  Administered 2018-04-27: 650 mg via ORAL
  Filled 2018-04-27: qty 2

## 2018-04-27 MED ORDER — DIPHENHYDRAMINE HCL 25 MG PO CAPS
25.0000 mg | ORAL_CAPSULE | ORAL | Status: DC
  Administered 2018-04-27: 25 mg via ORAL

## 2018-05-14 ENCOUNTER — Other Ambulatory Visit: Payer: Self-pay | Admitting: *Deleted

## 2018-05-14 NOTE — Progress Notes (Signed)
Infusion orders are current for patient CBC CMP Tylenol Benadryl appointments are up to date and follow up appointment  is scheduled TB gold not due yet.  

## 2018-06-08 ENCOUNTER — Other Ambulatory Visit: Payer: Self-pay

## 2018-06-08 ENCOUNTER — Ambulatory Visit (HOSPITAL_COMMUNITY)
Admission: RE | Admit: 2018-06-08 | Discharge: 2018-06-08 | Disposition: A | Discharge status: Home / Self Care | Payer: Federal, State, Local not specified - PPO | Source: Ambulatory Visit | Attending: Rheumatology | Admitting: Rheumatology

## 2018-06-08 DIAGNOSIS — L4050 Arthropathic psoriasis, unspecified: Secondary | ICD-10-CM | POA: Diagnosis not present

## 2018-06-08 LAB — COMPREHENSIVE METABOLIC PANEL
ALT: 18 U/L (ref 0–44)
AST: 20 U/L (ref 15–41)
Albumin: 3.8 g/dL (ref 3.5–5.0)
Alkaline Phosphatase: 65 U/L (ref 38–126)
Anion gap: 9 (ref 5–15)
BUN: 14 mg/dL (ref 8–23)
CHLORIDE: 107 mmol/L (ref 98–111)
CO2: 21 mmol/L — ABNORMAL LOW (ref 22–32)
Calcium: 9.2 mg/dL (ref 8.9–10.3)
Creatinine, Ser: 1.15 mg/dL — ABNORMAL HIGH (ref 0.44–1.00)
GFR calc Af Amer: 56 mL/min — ABNORMAL LOW (ref >60)
GFR calc non Af Amer: 49 mL/min — ABNORMAL LOW (ref >60)
Glucose, Bld: 115 mg/dL — ABNORMAL HIGH (ref 70–99)
POTASSIUM: 4 mmol/L (ref 3.5–5.1)
Sodium: 137 mmol/L (ref 135–145)
Total Bilirubin: 0.5 mg/dL (ref 0.3–1.2)
Total Protein: 7.8 g/dL (ref 6.5–8.1)

## 2018-06-08 LAB — CBC
HCT: 40 % (ref 36.0–46.0)
Hemoglobin: 13 g/dL (ref 12.0–15.0)
MCH: 28.9 pg (ref 26.0–34.0)
MCHC: 32.5 g/dL (ref 30.0–36.0)
MCV: 88.9 fL (ref 80.0–100.0)
NRBC: 0 % (ref 0.0–0.2)
Platelets: 249 10*3/uL (ref 150–400)
RBC: 4.5 MIL/uL (ref 3.87–5.11)
RDW: 12.5 % (ref 11.5–15.5)
WBC: 7.3 10*3/uL (ref 4.0–10.5)

## 2018-06-08 MED ORDER — ACETAMINOPHEN 325 MG PO TABS: 650.0000 mg | ORAL_TABLET | ORAL | Status: DC

## 2018-06-08 MED ORDER — DIPHENHYDRAMINE HCL 25 MG PO CAPS: 25.0000 mg | ORAL_CAPSULE | ORAL | Status: DC

## 2018-06-08 MED ORDER — SODIUM CHLORIDE 0.9 % IV SOLN
5.0000 mg/kg | INTRAVENOUS | Status: DC
  Administered 2018-06-08: 400 mg via INTRAVENOUS
  Filled 2018-06-08: qty 40

## 2018-06-08 NOTE — Progress Notes (Signed)
stable °

## 2018-06-10 LAB — QUANTIFERON-TB GOLD PLUS: QuantiFERON-TB Gold Plus: NEGATIVE

## 2018-06-10 LAB — QUANTIFERON-TB GOLD PLUS (RQFGPL)
QUANTIFERON MITOGEN VALUE: 0.63 [IU]/mL
QuantiFERON Nil Value: 0.01 IU/mL
QuantiFERON TB1 Ag Value: 0.02 IU/mL
QuantiFERON TB2 Ag Value: 0.01 IU/mL

## 2018-06-11 ENCOUNTER — Other Ambulatory Visit: Payer: Self-pay | Admitting: Rheumatology

## 2018-06-11 NOTE — Telephone Encounter (Signed)
Last Visit: 04/27/18 Next Visit: 09/23/18 Labs: 06/08/18 Stable  Okay to refill per Dr. Estanislado Pandy

## 2018-07-09 ENCOUNTER — Other Ambulatory Visit: Payer: Self-pay | Admitting: *Deleted

## 2018-07-09 NOTE — Progress Notes (Signed)
Infusion orders are current for patient CBC CMP Tylenol Benadryl appointments are up to date and follow up appointment  is scheduled TB gold not due yet.  

## 2018-07-20 ENCOUNTER — Ambulatory Visit (HOSPITAL_COMMUNITY)
Admission: RE | Admit: 2018-07-20 | Discharge: 2018-07-20 | Disposition: A | Discharge status: Home / Self Care | Payer: Federal, State, Local not specified - PPO | Source: Ambulatory Visit | Attending: Rheumatology | Admitting: Rheumatology

## 2018-07-20 ENCOUNTER — Other Ambulatory Visit: Payer: Self-pay

## 2018-07-20 DIAGNOSIS — L4050 Arthropathic psoriasis, unspecified: Secondary | ICD-10-CM | POA: Diagnosis not present

## 2018-07-20 LAB — CBC
HCT: 38.5 % (ref 36.0–46.0)
Hemoglobin: 13 g/dL (ref 12.0–15.0)
MCH: 29.8 pg (ref 26.0–34.0)
MCHC: 33.8 g/dL (ref 30.0–36.0)
MCV: 88.3 fL (ref 80.0–100.0)
Platelets: 256 10*3/uL (ref 150–400)
RBC: 4.36 MIL/uL (ref 3.87–5.11)
RDW: 13.1 % (ref 11.5–15.5)
WBC: 6.6 10*3/uL (ref 4.0–10.5)
nRBC: 0 % (ref 0.0–0.2)

## 2018-07-20 LAB — COMPREHENSIVE METABOLIC PANEL
ALT: 15 U/L (ref 0–44)
AST: 18 U/L (ref 15–41)
Albumin: 3.8 g/dL (ref 3.5–5.0)
Alkaline Phosphatase: 74 U/L (ref 38–126)
Anion gap: 8 (ref 5–15)
BUN: 18 mg/dL (ref 8–23)
CO2: 21 mmol/L — ABNORMAL LOW (ref 22–32)
Calcium: 9.4 mg/dL (ref 8.9–10.3)
Chloride: 107 mmol/L (ref 98–111)
Creatinine, Ser: 1.23 mg/dL — ABNORMAL HIGH (ref 0.44–1.00)
GFR calc Af Amer: 52 mL/min — ABNORMAL LOW (ref >60)
GFR calc non Af Amer: 45 mL/min — ABNORMAL LOW (ref >60)
Glucose, Bld: 169 mg/dL — ABNORMAL HIGH (ref 70–99)
Potassium: 4.1 mmol/L (ref 3.5–5.1)
Sodium: 136 mmol/L (ref 135–145)
Total Bilirubin: 0.5 mg/dL (ref 0.3–1.2)
Total Protein: 7.7 g/dL (ref 6.5–8.1)

## 2018-07-20 MED ORDER — DIPHENHYDRAMINE HCL 25 MG PO CAPS: 25.0000 mg | ORAL_CAPSULE | ORAL | Status: DC

## 2018-07-20 MED ORDER — ACETAMINOPHEN 325 MG PO TABS: 650.0000 mg | ORAL_TABLET | ORAL | Status: DC

## 2018-07-20 MED ORDER — SODIUM CHLORIDE 0.9 % IV SOLN
5.0000 mg/kg | INTRAVENOUS | Status: DC
  Administered 2018-07-20: 400 mg via INTRAVENOUS
  Filled 2018-07-20: qty 40

## 2018-07-20 NOTE — Progress Notes (Signed)
CBC normal

## 2018-07-20 NOTE — Progress Notes (Signed)
Cr elevated. Will continue to monitor.

## 2018-08-18 ENCOUNTER — Other Ambulatory Visit: Payer: Self-pay | Admitting: *Deleted

## 2018-08-18 DIAGNOSIS — L4050 Arthropathic psoriasis, unspecified: Secondary | ICD-10-CM

## 2018-08-18 NOTE — Progress Notes (Signed)
Infusion orders are current for patient CBC CMP Tylenol Benadryl appointments are up to date and follow up appointment  is scheduled TB gold not due yet.  

## 2018-08-30 NOTE — Addendum Note (Signed)
Addended by: Carole Binning on: 08/30/2018 03:25 PM   Modules accepted: Orders, SmartSet

## 2018-08-31 ENCOUNTER — Other Ambulatory Visit: Payer: Self-pay

## 2018-08-31 ENCOUNTER — Ambulatory Visit (HOSPITAL_COMMUNITY)
Admission: RE | Admit: 2018-08-31 | Discharge: 2018-08-31 | Disposition: A | Discharge status: Home / Self Care | Payer: Federal, State, Local not specified - PPO | Source: Ambulatory Visit | Attending: Rheumatology | Admitting: Rheumatology

## 2018-08-31 DIAGNOSIS — L4050 Arthropathic psoriasis, unspecified: Secondary | ICD-10-CM | POA: Insufficient documentation

## 2018-08-31 MED ORDER — SODIUM CHLORIDE 0.9 % IV SOLN
5.0000 mg/kg | INTRAVENOUS | Status: DC
  Administered 2018-08-31: 400 mg via INTRAVENOUS
  Filled 2018-08-31: qty 40

## 2018-08-31 MED ORDER — DIPHENHYDRAMINE HCL 25 MG PO CAPS: 25.0000 mg | ORAL_CAPSULE | ORAL | Status: DC

## 2018-08-31 MED ORDER — ACETAMINOPHEN 325 MG PO TABS: 650.0000 mg | ORAL_TABLET | ORAL | Status: DC

## 2018-09-09 NOTE — Progress Notes (Signed)
Office Visit Note  Patient: Morgan Jensen             Date of Birth: 30-Aug-1950           MRN: [id number]             PCP: Maurice Small, MD Referring: Maurice Small, MD Visit Date: 09/23/2018 Occupation: @GUAROCC @  Subjective:  [place] management  History of Present Illness: Morgan Jensen is a 68 y.o. female with history of psoriatic arthritis, psoriasis and osteoarthritis.  She states she has been doing quite well on combination of Remicade and methotrexate.  She denies any joint swelling.  She has not had any psoriasis lesions.  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  She has been followed by her dermatologist.  She denies any SI joint discomfort.  Activities of Daily Living:  Patient reports morning stiffness for 0 none.   Patient Denies nocturnal pain.  Difficulty dressing/grooming: Denies Difficulty climbing stairs: Denies Difficulty getting out of chair: Denies Difficulty using hands for taps, buttons, cutlery, and/or writing: Denies  Review of Systems  Constitutional: Negative for fatigue, night sweats, weight gain and weight loss.  HENT: Negative for mouth sores, trouble swallowing, trouble swallowing, mouth dryness and nose dryness.   Eyes: Positive for dryness. Negative for pain, redness and visual disturbance.  Respiratory: Negative for cough, shortness of breath and difficulty breathing.   Cardiovascular: Negative for chest pain, palpitations, hypertension, irregular heartbeat and swelling in legs/feet.  Gastrointestinal: Negative for blood in stool, constipation and diarrhea.  Endocrine: Negative for excessive thirst and increased urination.  Genitourinary: Negative for difficulty urinating and vaginal dryness.  Musculoskeletal: Negative for arthralgias, joint pain, joint swelling, myalgias, muscle weakness, morning stiffness, muscle tenderness and myalgias.  Skin: Positive for rash. Negative for color change, hair loss, skin tightness,  ulcers and sensitivity to sunlight.  Allergic/Immunologic: Negative for susceptible to infections.  Neurological: Negative for dizziness, memory loss, night sweats and weakness.  Hematological: Negative for bruising/bleeding tendency and swollen glands.  Psychiatric/Behavioral: Negative for depressed mood and sleep disturbance. The patient is not nervous/anxious.     PMFS History:  Patient Active Problem List   Diagnosis Date Noted  . Osteoarthritis of knee 01/18/2016  . Hypertension 01/18/2016  . Anxiety 01/18/2016  . Elevated cholesterol 01/18/2016  . CKD (chronic kidney disease) 01/18/2016  . Psoriatic arthritis ([place]) 10/27/2015  . Other psoriasis 10/27/2015    Past Medical History:  Diagnosis Date  . Anxiety 01/18/2016  . Arthritis    psoriatic arthritis   . CKD (chronic kidney disease) 01/18/2016  . Elevated cholesterol 01/18/2016  . Hypertension 01/18/2016  . Osteoarthritis of knee 01/18/2016  . Psoriasis     Family History  Problem Relation Age of Onset  . Aneurysm Mother   . Cancer Father        Lung Cancer  . Cancer Brother        Esopheageal Cancer   Past Surgical History:  Procedure Laterality Date  . BREAST EXCISIONAL BIOPSY Right 1986  . BREAST SURGERY Right 1985   Benign Tumor removed   . LEG SURGERY Left 2004   Fibula and Tibia Rod placement   . TUBAL LIGATION  1993   Social History   Social History Narrative  . Not on file   Immunization History  Administered Date(s) Administered  . Influenza, High Dose Seasonal PF 01/04/2018  . Zoster Recombinat (Shingrix) 06/24/2017, 08/26/2017     Objective: Vital Signs: BP Marland Kitchen)  109/59 (BP Location: Left Arm, Patient Position: Sitting, Cuff Size: Normal)   Pulse 76   Resp 16   Ht 5\' 3"  (1.6 m)   Wt 171 lb 3.2 oz (77.7 kg)   BMI 30.33 kg/m    Physical Exam Vitals signs and nursing note reviewed.  Constitutional:      Appearance: She is well-developed.  HENT:     Head: Normocephalic and  atraumatic.  Eyes:     Conjunctiva/sclera: Conjunctivae normal.  Neck:     Musculoskeletal: Normal range of motion.  Cardiovascular:     Rate and Rhythm: Normal rate and regular rhythm.     Heart sounds: Normal heart sounds.  Pulmonary:     Effort: Pulmonary effort is normal.     Breath sounds: Normal breath sounds.  Abdominal:     General: Bowel sounds are normal.     Palpations: Abdomen is soft.  Lymphadenopathy:     Cervical: No cervical adenopathy.  Skin:    General: Skin is warm and dry.     Capillary Refill: Capillary refill takes less than 2 seconds.     Findings: Rash present.     Comments: Erythematous plaques are present over bilateral elbows and lower extremities.  Neurological:     Mental Status: She is alert and oriented to person, place, and time.  Psychiatric:        Behavior: Behavior normal.      Musculoskeletal Exam: C-spine thoracic and lumbar spine with good range of motion.  Shoulder joints, elbow joints, wrist joints, MCPs PIPs and DIPs with good range of motion with no synovitis.  Hip joints, knee joints, ankles, MTPs and PIPs with good range of motion with no synovitis.  He had no SI joint tenderness on examination today.  CDAI Exam: CDAI Score: 0.2  Patient Global: 1 mm; Provider Global: 1 mm Swollen: 0 ; Tender: 0  Joint Exam   No joint exam has been documented for this visit   There is currently no information documented on the homunculus. Go to the Rheumatology activity and complete the homunculus joint exam.  Investigation: No additional findings.  Imaging: No results found.  Recent Labs: Lab Results  Component Value Date   WBC 6.6 07/20/2018   HGB 13.0 07/20/2018   PLT 256 07/20/2018   NA 136 07/20/2018   K 4.1 07/20/2018   CL 107 07/20/2018   CO2 21 (L) 07/20/2018   GLUCOSE 169 (H) 07/20/2018   BUN 18 07/20/2018   CREATININE 1.23 (H) 07/20/2018   BILITOT 0.5 07/20/2018   ALKPHOS 74 07/20/2018   AST 18 07/20/2018   ALT 15  07/20/2018   PROT 7.7 07/20/2018   ALBUMIN 3.8 07/20/2018   CALCIUM 9.4 07/20/2018   GFRAA 52 (L) 07/20/2018   QFTBGOLD Negative 07/22/2016   QFTBGOLDPLUS Negative 06/08/2018    Speciality Comments: Remicade 5mg /kg every 6 weeks TB gold negative 07/22/16  Procedures:  No procedures performed Allergies: Patient has no known allergies.   Assessment / Plan:     Visit Diagnoses: Psoriatic arthritis ([place]) -patient had no synovitis on examination.  Her psoriatic arthritis appears to be well controlled on combination of Remicade and low-dose methotrexate.  Other psoriasis -she has no active lesions currently.  High risk medication use -Remicade IV infusion 5 mg/kg every 6 weeks (last infusion 08/31/2018) methotrexate 2.5 mg 2 tablets every 7 days, and folic acid 1 mg 1 tablet daily.  Last TB gold negative on 06/08/2018 and will monitor yearly.  Most  recent CBC/CMP within normal limits except for elevated creatinine but stable on 07/20/2018.  Will monitor with every other infusion.  She received the high-dose flu vaccine in October and previously received 2 doses of Shingrix.  Recommend annual flu, Prevnar 13, Pneumovax 23 as indicated for immunosuppressant therapy.  Recommend yearly skin exams due to increased risk of melanoma.  Precautions during this pandemic season were also discussed at length.  Sacroiliitis ([place]) - Plan: Doing well currently.  Primary osteoarthritis of both knees -she is not having any discomfort or swelling at this time.  Muscle strengthening was discussed.   Other medical conditions are listed as follows:  History of chronic kidney disease-her GFR has been persistently low.  I have advised her to discuss that further with her PCP.  I am uncertain about the etiology of her low GFR.  We will reduce methotrexate to only 2 tablets p.o. weekly.  She is also on ACE inhibitor's which could be contributing to her low GFR.    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  She is been followed by dermatology.  History of hypertension-blood pressure was well controlled.  History of hypercholesterolemia  Vitamin D deficiency-she is on supplement.  Patient states she has had DEXA scan by her PCP in the past which was normal.  Have advised her to follow-up on the DEXA scan.  History of anxiety   Orders: No orders of the defined types were placed in this encounter.  No orders of the defined types were placed in this encounter.   .  Follow-Up Instructions: Return in about 5 months (around 02/23/2019) for Psoriatic arthritis.   Bo Merino, MD  Note - This record has been created using Editor, commissioning.  Chart creation errors have been sought, but may not always  have been located. Such creation errors do not reflect on  the standard of medical care.

## 2018-09-23 ENCOUNTER — Encounter: Payer: Self-pay | Admitting: Physician Assistant

## 2018-09-23 ENCOUNTER — Ambulatory Visit: Payer: Federal, State, Local not specified - PPO | Admitting: Rheumatology

## 2018-09-23 VITALS — BP 109/59 | HR 76 | Resp 16 | Ht 63.0 in | Wt 171.2 lb

## 2018-09-23 DIAGNOSIS — L4050 Arthropathic psoriasis, unspecified: Secondary | ICD-10-CM | POA: Diagnosis not present

## 2018-09-23 DIAGNOSIS — M461 Sacroiliitis, not elsewhere classified: Secondary | ICD-10-CM

## 2018-09-23 DIAGNOSIS — L900 Lichen sclerosus et atrophicus: Secondary | ICD-10-CM

## 2018-09-23 DIAGNOSIS — M170 Bilateral primary osteoarthritis of knee: Secondary | ICD-10-CM

## 2018-09-23 DIAGNOSIS — L408 Other psoriasis: Secondary | ICD-10-CM | POA: Diagnosis not present

## 2018-09-23 DIAGNOSIS — Z79899 Other long term (current) drug therapy: Secondary | ICD-10-CM

## 2018-10-08 ENCOUNTER — Other Ambulatory Visit: Payer: Self-pay | Admitting: *Deleted

## 2018-10-08 NOTE — Progress Notes (Signed)
Infusion orders are current for patient CBC CMP Tylenol Benadryl appointments are up to date and follow up appointment  is scheduled TB gold not due yet.  

## 2018-10-12 ENCOUNTER — Other Ambulatory Visit: Payer: Self-pay

## 2018-10-12 ENCOUNTER — Encounter (HOSPITAL_COMMUNITY)
Admission: RE | Admit: 2018-10-12 | Discharge: 2018-10-12 | Disposition: A | Discharge status: Home / Self Care | Payer: Federal, State, Local not specified - PPO | Source: Ambulatory Visit | Attending: Rheumatology | Admitting: Rheumatology

## 2018-10-12 DIAGNOSIS — L4050 Arthropathic psoriasis, unspecified: Secondary | ICD-10-CM | POA: Insufficient documentation

## 2018-10-12 LAB — COMPREHENSIVE METABOLIC PANEL
ALT: 19 U/L (ref 0–44)
AST: 24 U/L (ref 15–41)
Albumin: 3.6 g/dL (ref 3.5–5.0)
Alkaline Phosphatase: 72 U/L (ref 38–126)
Anion gap: 13 (ref 5–15)
BUN: 19 mg/dL (ref 8–23)
CO2: 20 mmol/L — ABNORMAL LOW (ref 22–32)
Calcium: 9.1 mg/dL (ref 8.9–10.3)
Chloride: 104 mmol/L (ref 98–111)
Creatinine, Ser: 1.21 mg/dL — ABNORMAL HIGH (ref 0.44–1.00)
GFR calc Af Amer: 53 mL/min — ABNORMAL LOW (ref >60)
GFR calc non Af Amer: 46 mL/min — ABNORMAL LOW (ref >60)
Glucose, Bld: 140 mg/dL — ABNORMAL HIGH (ref 70–99)
Potassium: 3.9 mmol/L (ref 3.5–5.1)
Sodium: 137 mmol/L (ref 135–145)
Total Bilirubin: 0.5 mg/dL (ref 0.3–1.2)
Total Protein: 7.8 g/dL (ref 6.5–8.1)

## 2018-10-12 LAB — CBC
HCT: 38.2 % (ref 36.0–46.0)
Hemoglobin: 12.9 g/dL (ref 12.0–15.0)
MCH: 30.4 pg (ref 26.0–34.0)
MCHC: 33.8 g/dL (ref 30.0–36.0)
MCV: 89.9 fL (ref 80.0–100.0)
Platelets: 270 10*3/uL (ref 150–400)
RBC: 4.25 MIL/uL (ref 3.87–5.11)
RDW: 12.2 % (ref 11.5–15.5)
WBC: 6.8 10*3/uL (ref 4.0–10.5)
nRBC: 0 % (ref 0.0–0.2)

## 2018-10-12 MED ORDER — DIPHENHYDRAMINE HCL 25 MG PO CAPS: 25.0000 mg | ORAL_CAPSULE | ORAL | Status: DC

## 2018-10-12 MED ORDER — ACETAMINOPHEN 325 MG PO TABS: 650.0000 mg | ORAL_TABLET | ORAL | Status: DC

## 2018-10-12 MED ORDER — SODIUM CHLORIDE 0.9 % IV SOLN
5.0000 mg/kg | INTRAVENOUS | Status: DC
  Administered 2018-10-12: 400 mg via INTRAVENOUS
  Filled 2018-10-12: qty 40

## 2018-10-12 NOTE — Progress Notes (Signed)
Creatinine is still elevated.  Most likely due to the use of lisinopril.  Patient should discuss this with her PCP.  Otherwise we can refer her to nephrology.  Please forward labs to her PCP.

## 2018-10-12 NOTE — Progress Notes (Signed)
CBC is normal.

## 2018-11-19 ENCOUNTER — Other Ambulatory Visit: Payer: Self-pay | Admitting: *Deleted

## 2018-11-19 NOTE — Progress Notes (Signed)
Infusion orders are current for patient CBC CMP Tylenol Benadryl appointments are up to date and follow up appointment  is scheduled TB gold not due yet.  

## 2018-11-23 ENCOUNTER — Other Ambulatory Visit: Payer: Self-pay

## 2018-11-23 ENCOUNTER — Ambulatory Visit (HOSPITAL_COMMUNITY)
Admission: RE | Admit: 2018-11-23 | Discharge: 2018-11-23 | Disposition: A | Discharge status: Home / Self Care | Payer: Federal, State, Local not specified - PPO | Source: Ambulatory Visit | Attending: Rheumatology | Admitting: Rheumatology

## 2018-11-23 DIAGNOSIS — L4050 Arthropathic psoriasis, unspecified: Secondary | ICD-10-CM | POA: Insufficient documentation

## 2018-11-23 MED ORDER — DIPHENHYDRAMINE HCL 25 MG PO CAPS
25.0000 mg | ORAL_CAPSULE | ORAL | Status: DC
  Administered 2018-11-23: 12:00:00 25 mg via ORAL

## 2018-11-23 MED ORDER — ACETAMINOPHEN 325 MG PO TABS
650.0000 mg | ORAL_TABLET | ORAL | Status: DC
  Administered 2018-11-23: 12:00:00 650 mg via ORAL

## 2018-11-23 MED ORDER — ACETAMINOPHEN 325 MG PO TABS
ORAL_TABLET | ORAL | Status: AC
  Administered 2018-11-23: 650 mg via ORAL
  Filled 2018-11-23: qty 2

## 2018-11-23 MED ORDER — SODIUM CHLORIDE 0.9 % IV SOLN
5.0000 mg/kg | INTRAVENOUS | Status: DC
  Administered 2018-11-23: 13:00:00 400 mg via INTRAVENOUS
  Filled 2018-11-23: qty 40

## 2018-11-23 MED ORDER — DIPHENHYDRAMINE HCL 25 MG PO CAPS
ORAL_CAPSULE | ORAL | Status: AC
  Administered 2018-11-23: 25 mg via ORAL
  Filled 2018-11-23: qty 1

## 2018-12-24 ENCOUNTER — Other Ambulatory Visit: Payer: Self-pay | Admitting: Family Medicine

## 2018-12-24 DIAGNOSIS — Z1231 Encounter for screening mammogram for malignant neoplasm of breast: Secondary | ICD-10-CM

## 2018-12-25 ENCOUNTER — Encounter: Payer: Self-pay | Admitting: Rheumatology

## 2018-12-27 NOTE — Telephone Encounter (Signed)
He has several other treatment options available for psoriatic arthritis.  I would recommend scheduling an appointment to discuss other treatment options.  She may discontinue Remicade.

## 2018-12-28 NOTE — Progress Notes (Signed)
Office Visit Note  Patient: Morgan Jensen             Date of Birth: 1950-10-06           MRN: [id number]             PCP: Maurice Small, MD Referring: Maurice Small, MD Visit Date: 12/29/2018 Occupation: @GUAROCC @  Subjective:  Discuss treatment options   History of Present Illness: Morgan Jensen is a 68 y.o. female with history of psoriatic arthritis and osteoarthritis.  She is on Remicade IV 5 mg/kg infusions every 8 weeks and methotrexate 2 tablets by mouth once weekly.  Her last infusion was on 11/23/2018.  She reports that for the last 3 infusions she is ran a low-grade fever the night of.  She states her temperature got up to 100.8 after the last infusion.  She presents today to discuss treatment options.  She states that Remicade has been very effective and she has not had any recent flares.  She is having outbreak of psoriasis and was evaluated by her dermatologist.  She just started applying Halobetasol 0.05% topically as needed.  She reports current concern today she was cleaning her house and twisted her right knee.  She states that the pain did not start until the next day.  She has noticed some warmth and swelling but denies any bruising or erythema.  She has been having difficulty fully extending the right knee due to discomfort and stiffness.  She is also been having difficulty climbing steps.  She denies any other joint pain or joint swelling at this time.  She denies any Achilles tendinitis or plantar fasciitis.  She denies any SI joint pain.    Activities of Daily Living:  Patient reports morning stiffness for 1  minute.   Patient Reports nocturnal pain.  Difficulty dressing/grooming: Denies Difficulty climbing stairs: Reports Difficulty getting out of chair: Reports Difficulty using hands for taps, buttons, cutlery, and/or writing: Denies  Review of Systems  Constitutional: Negative for fatigue.  HENT: Negative for mouth sores, mouth dryness and nose dryness.   Eyes:  Negative for pain, visual disturbance and dryness.  Respiratory: Negative for cough, hemoptysis, shortness of breath and difficulty breathing.   Cardiovascular: Negative for chest pain, palpitations, hypertension and swelling in legs/feet.  Gastrointestinal: Negative for blood in stool, constipation and diarrhea.  Endocrine: Negative for increased urination.  Genitourinary: Negative for painful urination.  Musculoskeletal: Positive for arthralgias, joint pain, joint swelling and morning stiffness. Negative for myalgias, muscle weakness, muscle tenderness and myalgias.  Skin: Positive for rash (Psoriasis). Negative for color change, pallor, hair loss, nodules/bumps, skin tightness, ulcers and sensitivity to sunlight.  Allergic/Immunologic: Negative for susceptible to infections.  Neurological: Negative for dizziness, numbness, headaches and weakness.  Hematological: Negative for swollen glands.  Psychiatric/Behavioral: Negative for depressed mood and sleep disturbance. The patient is not nervous/anxious.     PMFS History:  Patient Active Problem List   Diagnosis Date Noted   Osteoarthritis of knee 01/18/2016   Hypertension 01/18/2016   Anxiety 01/18/2016   Elevated cholesterol 01/18/2016   CKD (chronic kidney disease) 01/18/2016   Psoriatic arthritis ([place]) 10/27/2015   Other psoriasis 10/27/2015    Past Medical History:  Diagnosis Date   Anxiety 01/18/2016   Arthritis    psoriatic arthritis    CKD (chronic kidney disease) 01/18/2016   Elevated cholesterol 01/18/2016   Hypertension 01/18/2016   Osteoarthritis of knee 01/18/2016   Psoriasis     Family History  Problem Relation Age of Onset   Aneurysm Mother    Cancer Father        Lung Cancer   Cancer Brother        Esopheageal Cancer   Past Surgical History:  Procedure Laterality Date   BREAST EXCISIONAL BIOPSY Right 1986   BREAST SURGERY Right 1985   Benign Tumor removed    LEG SURGERY Left 2004    Fibula and Tibia Rod placement    TUBAL LIGATION  1993   Social History   Social History Narrative   Not on file   Immunization History  Administered Date(s) Administered   Influenza, High Dose Seasonal PF 01/04/2018   Zoster Recombinat (Shingrix) 06/24/2017, 08/26/2017     Objective: Vital Signs: BP 136/83 (BP Location: Left Arm, Patient Position: Sitting, Cuff Size: Normal)    Pulse 70    Resp 13    Ht 5\' 3"  (1.6 m)    Wt 170 lb 9.6 oz (77.4 kg)    BMI 30.22 kg/m    Physical Exam Vitals signs and nursing note reviewed.  Constitutional:      Appearance: She is well-developed.  HENT:     Head: Normocephalic and atraumatic.  Eyes:     Conjunctiva/sclera: Conjunctivae normal.  Neck:     Musculoskeletal: Normal range of motion.  Cardiovascular:     Rate and Rhythm: Normal rate and regular rhythm.     Heart sounds: Normal heart sounds.  Pulmonary:     Effort: Pulmonary effort is normal.     Breath sounds: Normal breath sounds.  Abdominal:     General: Bowel sounds are normal.     Palpations: Abdomen is soft.  Lymphadenopathy:     Cervical: No cervical adenopathy.  Skin:    General: Skin is warm and dry.     Capillary Refill: Capillary refill takes less than 2 seconds.     Comments: Psoriasis on bilateral lower extremities and on the extensor surface of both elbow joints   Neurological:     Mental Status: She is alert and oriented to person, place, and time.  Psychiatric:        Behavior: Behavior normal.      Musculoskeletal Exam: C-spine, thoracic spine, lumbar spine good range of motion.  No midline spinal tenderness.  No SI joint tenderness.  Shoulder joints, bridge, wrist joints, MCPs, PIPs, DIPs good range of motion with no synovitis.  She has mild PIP and DIP synovial thickening consistent with osteoarthritis of both hands.  Hip joints have good range of motion with no discomfort.  She has full range of motion bilateral knee joints.  She has discomfort with  extension of the right knee.  Warmth of the right knee was noted but no effusion was apparent.  Ankle joints have good range of motion with no tenderness or inflammation.  She has no Achilles tendinitis or plantar fasciitis.  No trochanteric bursa tenderness.    CDAI Exam: CDAI Score: -- Patient Global: --; Provider Global: -- Swollen: --; Tender: -- Joint Exam   No joint exam has been documented for this visit   There is currently no information documented on the homunculus. Go to the Rheumatology activity and complete the homunculus joint exam.  Investigation: No additional findings.  Imaging: Xr Knee 3 View Right  Result Date: 12/29/2018 Moderate medial and lateral compartment narrowing was noted.  Lateral and intercondylar osteophytes were noted.  Some loose bodies were noted on the lateral aspect of the knee.  Moderate patellofemoral narrowing was noted. Impression: These findings are consistent with moderate medial and lateral compartment narrowing consistent with inflammatory arthritis.  Moderate chondromalacia patella was noted.   Recent Labs: Lab Results  Component Value Date   WBC 6.8 10/12/2018   HGB 12.9 10/12/2018   PLT 270 10/12/2018   NA 137 10/12/2018   K 3.9 10/12/2018   CL 104 10/12/2018   CO2 20 (L) 10/12/2018   GLUCOSE 140 (H) 10/12/2018   BUN 19 10/12/2018   CREATININE 1.21 (H) 10/12/2018   BILITOT 0.5 10/12/2018   ALKPHOS 72 10/12/2018   AST 24 10/12/2018   ALT 19 10/12/2018   PROT 7.8 10/12/2018   ALBUMIN 3.6 10/12/2018   CALCIUM 9.1 10/12/2018   GFRAA 53 (L) 10/12/2018   QFTBGOLD Negative 07/22/2016   QFTBGOLDPLUS Negative 06/08/2018    Speciality Comments: Remicade 5mg /kg every 6 weeks TB gold negative 07/22/16  Procedures:  No procedures performed Allergies: Patient has no known allergies.      Assessment / Plan:     Visit Diagnoses: Psoriatic arthritis ([place]): She has no synovitis or dactylitis on exam.  She has not had any recent  psoriatic arthritis flares.  She has no Achilles tendinitis or plantar fasciitis.  No SI joint tenderness on exam today.  She presents today with acute right knee joint pain after twisting it on Saturday.  She is not experiencing any mechanical symptoms at this time.  She is mild warmth of the right knee joint but no effusion was noted.  We discussed conservative treatment options and x-ray was obtained today.  She has no other joint pain or joint swelling at this time.  She was started on Remicade IV infusions in 2014.  She has been on Remicade 5 mg/kg every 8 weeks and methotrexate 2 tablets by mouth once weekly.  Her last infusion was on 11/23/2018.  With the past 3 infusion she has been experiencing low-grade fevers.  She will be discontinuing Remicade.  We discussed several different treatment options today.  In the past she had an inadequate response to Stelara and Enbrel.  We discussed switching her from Remicade to Cosentyx.  Indications, contraindications, potential side effects of Cosentyx were discussed.  All questions were addressed and consent was obtained today.  We will apply for Cosentyx to her insurance and once approved she will come to the office for the administration of the first injection.  She will continue taking methotrexate as prescribed.  We will obtain baseline lab work today.  She will follow-up in the office in about 4 weeks. Medication counseling: TB Gold: 06/08/2018 TB gold negative. Hepatitis panel: Pending HIV: Pending SPEP: Pending Immunoglobulin: Pending  Does patient have a history of inflammatory bowel disease? No  Counseled patient that Cosentyx is a IL-17 inhibitor that works to reduce pain and inflammation associated with arthritis.  Counseled patient on purpose, proper use, and adverse effects of Cosentyx. Reviewed the most common adverse effects of infection, inflammatory bowel disease, and allergic reaction.  Reviewed the importance of regular labs while on  Cosentyx.  Counseled patient that Cosentyx should be held prior to scheduled surgery.  Counseled patient to avoid live vaccines while on Cosentyx.  Advised patient to get annual influenza vaccine and the pneumococcal vaccine as indicated.  Provided patient with medication education material and answered all questions.  Patient consented to Cosentyx.  Will upload consent into patient's chart.  Will apply for Cosentyx through patient's insurance.  Reviewed storage information for Cosentyx.  Advised  initial injection must be administered in office.  Patient voiced understanding.   Other psoriasis: She has patches of psoriasis on bilateral lower extremities and the extensor surface of both elbow joints.  She was evaluated by her dermatologist recently and was prescribed halobetasol 0.05% which she applies topically as needed.  High risk medication use -We will apply for Cosentyx through her insurance.  She will not be proceeding with her Remicade infusion on 01/04/2019.  She has been running low-grade fevers after receiving the last 3 Remicade infusions.  She was originally started on Remicade in 2014.  Her last infusion was on 11/23/18.  She will continue on methotrexate 2.5 mg 2 tablets every 7 days and folic acid 1 mg 1 tablet daily. Prior therapy includes: Stelara (improvement in psoriasis but not joints), Enbrel (improvement in joints but not psoriasis)  Last TB gold negative on 06/08/2018.  CBC and CMP were within normal limits except for elevated serum creatinine and decreased GFR on 10/12/2018.  We will check CBC and CMP today as well as the following lab work prior to switching her to Cosentyx.- Plan: HIV Antibody (routine testing w rflx), Serum protein electrophoresis with reflex, IgG, IgA, IgM, Hepatitis C antibody, Hepatitis B core antibody, IgM, CBC with Differential/Platelet, COMPLETE METABOLIC PANEL WITH GFR  Sacroiliitis ([place]): She has no SI joint tenderness on exam today.  Primary osteoarthritis of  both knees: She presents today with right knee joint pain which started on Saturday.  She twisted her right knee while cleaning the house.  She is not experiencing any mechanical symptoms at this time.  She has mild warmth but no effusion on exam.  X-rays of the right knee were obtained today.  We discussed icing, elevating, using compression wrap, and resting the right knee joint.  If she fails conservative treatment we will discuss scheduling an MRI of the right knee joint.  Her left knee joint is doing well and has good range of motion with no warmth or effusion on exam today.  Acute pain of right knee - She presents today with right knee joint pain which started on Saturday.  She was cleaning the house and twisted her right knee and has had discomfort since.  She has mild warmth but no effusion on exam.  No ecchymosis or erythema was noted.  She has discomfort with extension.  She is having difficulty climbing steps and getting up from a chair due to discomfort and stiffness.  She is not experiencing any mechanical symptoms at this time.  No instability was noted.  X-rays of the right knee were obtained today.  We discussed conservative treatment options.  She is advised to use Voltaren gel topically as needed for pain relief as well as using ice and a compression wrap.  If she fails conservative treatment we will discussed scheduling MRI of the right knee. 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  History of hypercholesterolemia  History of chronic kidney disease  History of anxiety  Vitamin D deficiency  History of hypertension    Orders: Orders Placed This Encounter  Procedures   XR KNEE 3 VIEW RIGHT   HIV Antibody (routine testing w rflx)   Serum protein electrophoresis with reflex   IgG, IgA, IgM   Hepatitis C antibody   Hepatitis B core antibody, IgM   CBC with Differential/Platelet    COMPLETE METABOLIC PANEL WITH GFR   No  orders of the defined types were placed in this encounter.   Face-to-face time spent with patient was 30 minutes. Greater than 50% of time was spent in counseling and coordination of care.  Follow-Up Instructions: Return in about 4 weeks (around 01/26/2019) for Psoriatic arthritis, Osteoarthritis.   Morgan Neas, PA-C   I examined and evaluated the patient with Hazel Sams PA. The plan of care was discussed as noted above.  Bo Merino, MD  Note - This record has been created using Editor, commissioning.  Chart creation errors have been sought, but may not always  have been located. Such creation errors do not reflect on  the standard of medical care.

## 2018-12-29 ENCOUNTER — Other Ambulatory Visit: Payer: Self-pay

## 2018-12-29 ENCOUNTER — Telehealth: Payer: Self-pay | Admitting: Pharmacist

## 2018-12-29 ENCOUNTER — Encounter: Payer: Self-pay | Admitting: Physician Assistant

## 2018-12-29 ENCOUNTER — Ambulatory Visit (INDEPENDENT_AMBULATORY_CARE_PROVIDER_SITE_OTHER): Payer: Federal, State, Local not specified - PPO | Admitting: Physician Assistant

## 2018-12-29 ENCOUNTER — Ambulatory Visit: Payer: Self-pay

## 2018-12-29 VITALS — BP 136/83 | HR 70 | Resp 13 | Ht 63.0 in | Wt 170.6 lb

## 2018-12-29 DIAGNOSIS — M461 Sacroiliitis, not elsewhere classified: Secondary | ICD-10-CM | POA: Diagnosis not present

## 2018-12-29 DIAGNOSIS — L4050 Arthropathic psoriasis, unspecified: Secondary | ICD-10-CM | POA: Diagnosis not present

## 2018-12-29 DIAGNOSIS — M170 Bilateral primary osteoarthritis of knee: Secondary | ICD-10-CM

## 2018-12-29 DIAGNOSIS — Z8679 Personal history of other diseases of the circulatory system: Secondary | ICD-10-CM

## 2018-12-29 DIAGNOSIS — L408 Other psoriasis: Secondary | ICD-10-CM | POA: Diagnosis not present

## 2018-12-29 DIAGNOSIS — Z79899 Other long term (current) drug therapy: Secondary | ICD-10-CM | POA: Diagnosis not present

## 2018-12-29 DIAGNOSIS — Z8639 Personal history of other endocrine, nutritional and metabolic disease: Secondary | ICD-10-CM

## 2018-12-29 DIAGNOSIS — Z87448 Personal history of other diseases of urinary system: Secondary | ICD-10-CM

## 2018-12-29 DIAGNOSIS — Z8659 Personal history of other mental and behavioral disorders: Secondary | ICD-10-CM

## 2018-12-29 DIAGNOSIS — E559 Vitamin D deficiency, unspecified: Secondary | ICD-10-CM

## 2018-12-29 DIAGNOSIS — L920 Granuloma annulare: Secondary | ICD-10-CM

## 2018-12-29 DIAGNOSIS — M25561 Pain in right knee: Secondary | ICD-10-CM

## 2018-12-29 NOTE — Telephone Encounter (Signed)
Please start BIV for Cosentyx.  She has tried and failed Remicade, Enbrel, Stelara, and methotrexate.  Patient dose will be for plaque psoriasis +/- psoriatic arthritis 300 mg every 7 days for 5 weeks then 300 mg every 28 days.

## 2018-12-29 NOTE — Telephone Encounter (Signed)
Received notification from [facility] regarding a prior authorization for COSENTYX 300mg  pens. Authorization has been APPROVED from 11/29/2018 to 06/27/2019. Authorization includes loading dosage.  Will send document to scan center.  Per plan patient must fill through [facility]. Patient is copay card eligible.  Authorization # [id number] Phone # [phone] Rep- [place]  2:34 PM Beatriz Chancellor, CPhT

## 2018-12-29 NOTE — Patient Instructions (Signed)
Journal for Nurse Practitioners, 15(4), 263-267. Retrieved December 14, 2017 from http://clinicalkey.com/nursing">  Knee Exercises Ask your health care provider which exercises are safe for you. Do exercises exactly as told by your health care provider and adjust them as directed. It is normal to feel mild stretching, pulling, tightness, or discomfort as you do these exercises. Stop right away if you feel sudden pain or your pain gets worse. Do not begin these exercises until told by your health care provider. Stretching and range-of-motion exercises These exercises warm up your muscles and joints and improve the movement and flexibility of your knee. These exercises also help to relieve pain and swelling. Knee extension, prone 1. Lie on your abdomen (prone position) on a bed. 2. Place your left / right knee just beyond the edge of the surface so your knee is not on the bed. You can put a towel under your left / right thigh just above your kneecap for comfort. 3. Relax your leg muscles and allow gravity to straighten your knee (extension). You should feel a stretch behind your left / right knee. 4. Hold this position for __________ seconds. 5. Scoot up so your knee is supported between repetitions. Repeat __________ times. Complete this exercise __________ times a day. Knee flexion, active  1. Lie on your back with both legs straight. If this causes back discomfort, bend your left / right knee so your foot is flat on the floor. 2. Slowly slide your left / right heel back toward your buttocks. Stop when you feel a gentle stretch in the front of your knee or thigh (flexion). 3. Hold this position for __________ seconds. 4. Slowly slide your left / right heel back to the starting position. Repeat __________ times. Complete this exercise __________ times a day. Quadriceps stretch, prone  1. Lie on your abdomen on a firm surface, such as a bed or padded floor. 2. Bend your left / right knee and hold  your ankle. If you cannot reach your ankle or pant leg, loop a belt around your foot and grab the belt instead. 3. Gently pull your heel toward your buttocks. Your knee should not slide out to the side. You should feel a stretch in the front of your thigh and knee (quadriceps). 4. Hold this position for __________ seconds. Repeat __________ times. Complete this exercise __________ times a day. Hamstring, supine 1. Lie on your back (supine position). 2. Loop a belt or towel over the [place] of your left / right foot. The [place] of your foot is on the walking surface, right under your toes. 3. Straighten your left / right knee and slowly pull on the belt to raise your leg until you feel a gentle stretch behind your knee (hamstring). ? Do not let your knee bend while you do this. ? Keep your other leg flat on the floor. 4. Hold this position for __________ seconds. Repeat __________ times. Complete this exercise __________ times a day. Strengthening exercises These exercises build strength and endurance in your knee. Endurance is the ability to use your muscles for a long time, even after they get tired. Quadriceps, isometric This exercise stretches the muscles in front of your thigh (quadriceps) without moving your knee joint (isometric). 1. Lie on your back with your left / right leg extended and your other knee bent. Put a rolled towel or small pillow under your knee if told by your health care provider. 2. Slowly tense the muscles in the front of your left /   right thigh. You should see your kneecap slide up toward your hip or see increased dimpling just above the knee. This motion will push the back of the knee toward the floor. 3. For __________ seconds, hold the muscle as tight as you can without increasing your pain. 4. Relax the muscles slowly and completely. Repeat __________ times. Complete this exercise __________ times a day. Straight leg raises This exercise stretches the muscles in front  of your thigh (quadriceps) and the muscles that move your hips (hip flexors). 1. Lie on your back with your left / right leg extended and your other knee bent. 2. Tense the muscles in the front of your left / right thigh. You should see your kneecap slide up or see increased dimpling just above the knee. Your thigh may even shake a bit. 3. Keep these muscles tight as you raise your leg 4-6 inches (10-15 cm) off the floor. Do not let your knee bend. 4. Hold this position for __________ seconds. 5. Keep these muscles tense as you lower your leg. 6. Relax your muscles slowly and completely after each repetition. Repeat __________ times. Complete this exercise __________ times a day. Hamstring, isometric 1. Lie on your back on a firm surface. 2. Bend your left / right knee about __________ degrees. 3. Dig your left / right heel into the surface as if you are trying to pull it toward your buttocks. Tighten the muscles in the back of your thighs (hamstring) to "dig" as hard as you can without increasing any pain. 4. Hold this position for __________ seconds. 5. Release the tension gradually and allow your muscles to relax completely for __________ seconds after each repetition. Repeat __________ times. Complete this exercise __________ times a day. Hamstring curls If told by your health care provider, do this exercise while wearing ankle weights. Begin with __________ lb weights. Then increase the weight by 1 lb (0.5 kg) increments. Do not wear ankle weights that are more than __________ lb. 1. Lie on your abdomen with your legs straight. 2. Bend your left / right knee as far as you can without feeling pain. Keep your hips flat against the floor. 3. Hold this position for __________ seconds. 4. Slowly lower your leg to the starting position. Repeat __________ times. Complete this exercise __________ times a day. Squats This exercise strengthens the muscles in front of your thigh and knee  (quadriceps). 1. Stand in front of a table, with your feet and knees pointing straight ahead. You may rest your hands on the table for balance but not for support. 2. Slowly bend your knees and lower your hips like you are going to sit in a chair. ? Keep your weight over your heels, not over your toes. ? Keep your lower legs upright so they are parallel with the table legs. ? Do not let your hips go lower than your knees. ? Do not bend lower than told by your health care provider. ? If your knee pain increases, do not bend as low. 3. Hold the squat position for __________ seconds. 4. Slowly push with your legs to return to standing. Do not use your hands to pull yourself to standing. Repeat __________ times. Complete this exercise __________ times a day. Wall slides This exercise strengthens the muscles in front of your thigh and knee (quadriceps). 1. Lean your back against a smooth wall or door, and walk your feet out 18-24 inches (46-61 cm) from it. 2. Place your feet hip-width apart. 3.   Slowly slide down the wall or door until your knees bend __________ degrees. Keep your knees over your heels, not over your toes. Keep your knees in line with your hips. 4. Hold this position for __________ seconds. Repeat __________ times. Complete this exercise __________ times a day. Straight leg raises This exercise strengthens the muscles that rotate the leg at the hip and move it away from your body (hip abductors). 1. Lie on your side with your left / right leg in the top position. Lie so your head, shoulder, knee, and hip line up. You may bend your bottom knee to help you keep your balance. 2. Roll your hips slightly forward so your hips are stacked directly over each other and your left / right knee is facing forward. 3. Leading with your heel, lift your top leg 4-6 inches (10-15 cm). You should feel the muscles in your outer hip lifting. ? Do not let your foot drift forward. ? Do not let your knee  roll toward the ceiling. 4. Hold this position for __________ seconds. 5. Slowly return your leg to the starting position. 6. Let your muscles relax completely after each repetition. Repeat __________ times. Complete this exercise __________ times a day. Straight leg raises This exercise stretches the muscles that move your hips away from the front of the pelvis (hip extensors). 1. Lie on your abdomen on a firm surface. You can put a pillow under your hips if that is more comfortable. 2. Tense the muscles in your buttocks and lift your left / right leg about 4-6 inches (10-15 cm). Keep your knee straight as you lift your leg. 3. Hold this position for __________ seconds. 4. Slowly lower your leg to the starting position. 5. Let your leg relax completely after each repetition. Repeat __________ times. Complete this exercise __________ times a day. This information is not intended to replace advice given to you by your health care provider. Make sure you discuss any questions you have with your health care provider. Document Released: 01/08/2005 Document Revised: 12/15/2017 Document Reviewed: 12/15/2017 Elsevier Patient Education  2020 [employer]  

## 2018-12-29 NOTE — Progress Notes (Signed)
Pharmacy Note  Subjective:  Patient presents today to the [facility] to see Dr. Estanislado Pandy.  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  She is currently on Remicade IV 5 mg/kg infusions every 8 weeks and methotrexate 2 tablets by mouth once weekly.  Her last infusion was on 11/23/2018.    Objective:  CBC    Component Value Date/Time   WBC 6.8 10/12/2018 1153   RBC 4.25 10/12/2018 1153   HGB 12.9 10/12/2018 1153   HCT 38.2 10/12/2018 1153   PLT 270 10/12/2018 1153   MCV 89.9 10/12/2018 1153   MCH 30.4 10/12/2018 1153   MCHC 33.8 10/12/2018 1153   RDW 12.2 10/12/2018 1153   LYMPHSABS 2.1 10/14/2016 1059   MONOABS 0.5 10/14/2016 1059   EOSABS 0.2 10/14/2016 1059   BASOSABS 0.0 10/14/2016 1059    CMP     Component Value Date/Time   NA 137 10/12/2018 1153   K 3.9 10/12/2018 1153   CL 104 10/12/2018 1153   CO2 20 (L) 10/12/2018 1153   GLUCOSE 140 (H) 10/12/2018 1153   BUN 19 10/12/2018 1153   CREATININE 1.21 (H) 10/12/2018 1153   CALCIUM 9.1 10/12/2018 1153   PROT 7.8 10/12/2018 1153   ALBUMIN 3.6 10/12/2018 1153   AST 24 10/12/2018 1153   ALT 19 10/12/2018 1153   ALKPHOS 72 10/12/2018 1153   BILITOT 0.5 10/12/2018 1153   GFRNONAA 46 (L) 10/12/2018 1153   GFRAA 53 (L) 10/12/2018 1153    Baseline Immunosuppressant Therapy Labs TB GOLD Quantiferon TB Gold Latest Ref Rng & Units 06/08/2018  Quantiferon TB Gold Plus Negative Negative   Hepatitis Panel: pending 12/30/2018  HIV: pending 12/30/2018  Immunoglobulins: pending 12/30/2018  SPEP: pending 12/30/2018  G6PD No results found for: G6PDH TPMT No results found for: TPMT   Chest x-ray: no active lung disease 08/26/2011  Does patient have a history of inflammatory bowel disease? No  Assessment/Plan:  Counseled patient that Cosentyx is a IL-17 inhibitor that works to reduce pain and inflammation associated with arthritis.  Counseled  patient on purpose, proper use, and adverse effects of Cosentyx.  Reviewed the most common adverse effects of infection, inflammatory bowel disease, and allergic reaction.  Counseled patient that Cosentyx should be held prior to scheduled surgery.  Counseled patient to avoid live vaccines while on Cosentyx.  Recommend annual influenza, Pneumovax 23, Prevnar 13, and Shingrix as indicated.   Reviewed the importance of regular labs while on Cosentyx. Standing orders placed. Provided patient with medication education material and answered all questions.  Patient consented to Cosentyx.  Will upload consent into patient's chart.  Will apply for Cosentyx through patient's insurance.  Reviewed storage information for Cosentyx.  Advised initial injection must be administered in office.  Patient voiced understanding.    Patient dose will be for plaque psoriasis +/- psoriatic arthritis 300 mg every 7 days for 5 weeks then 300 mg every 28 days.  Prescription pending labs and insurance approval.  All questions encouraged and answered.  Instructed patient to call with any questions or concerns.  Mariella Saa, PharmD, [place], [place] Clinical Specialty Pharmacist [phone]  12/29/2018 2:01 PM

## 2018-12-30 NOTE — Telephone Encounter (Signed)
Attempted to notify patient of approval. No answer. Left voicemail and requested return call.   Mariella Saa, PharmD, [place], [place] Clinical Specialty Pharmacist [phone]  12/30/2018 9:34 AM

## 2018-12-31 LAB — COMPLETE METABOLIC PANEL WITH GFR
AG Ratio: 1.3 (calc) (ref 1.0–2.5)
ALT: 13 U/L (ref 6–29)
AST: 18 U/L (ref 10–35)
Albumin: 4.2 g/dL (ref 3.6–5.1)
Alkaline phosphatase (APISO): 63 U/L (ref 37–153)
BUN/Creatinine Ratio: 12 (calc) (ref 6–22)
BUN: 13 mg/dL (ref 7–25)
CO2: 24 mmol/L (ref 20–32)
Calcium: 9.4 mg/dL (ref 8.6–10.4)
Chloride: 104 mmol/L (ref 98–110)
Creat: 1.07 mg/dL — ABNORMAL HIGH (ref 0.50–0.99)
GFR, Est African American: 61 mL/min/{1.73_m2} (ref >=60)
GFR, Est Non African American: 53 mL/min/{1.73_m2} — ABNORMAL LOW (ref >=60)
Globulin: 3.3 g/dL (calc) (ref 1.9–3.7)
Glucose, Bld: 94 mg/dL (ref 65–99)
Potassium: 4.4 mmol/L (ref 3.5–5.3)
Sodium: 137 mmol/L (ref 135–146)
Total Bilirubin: 0.4 mg/dL (ref 0.2–1.2)
Total Protein: 7.5 g/dL (ref 6.1–8.1)

## 2018-12-31 LAB — CBC WITH DIFFERENTIAL/PLATELET
Absolute Monocytes: 548 cells/uL (ref 200–950)
Basophils Absolute: 50 cells/uL (ref 0–200)
Basophils Relative: 0.8 %
Eosinophils Absolute: 132 cells/uL (ref 15–500)
Eosinophils Relative: 2.1 %
HCT: 36 % (ref 35.0–45.0)
Hemoglobin: 13.1 g/dL (ref 11.7–15.5)
Lymphs Abs: 2029 cells/uL (ref 850–3900)
MCH: 33.2 pg — ABNORMAL HIGH (ref 27.0–33.0)
MCHC: 36.4 g/dL — ABNORMAL HIGH (ref 32.0–36.0)
MCV: 91.4 fL (ref 80.0–100.0)
MPV: 11.3 fL (ref 7.5–12.5)
Monocytes Relative: 8.7 %
Neutro Abs: 3541 cells/uL (ref 1500–7800)
Neutrophils Relative %: 56.2 %
Platelets: 255 10*3/uL (ref 140–400)
RBC: 3.94 10*6/uL (ref 3.80–5.10)
RDW: 13 % (ref 11.0–15.0)
Total Lymphocyte: 32.2 %
WBC: 6.3 10*3/uL (ref 3.8–10.8)

## 2018-12-31 LAB — PROTEIN ELECTROPHORESIS, SERUM, WITH REFLEX
Albumin ELP: 4.1 g/dL (ref 3.8–4.8)
Alpha 1: 0.3 g/dL (ref 0.2–0.3)
Alpha 2: 0.8 g/dL (ref 0.5–0.9)
Beta 2: 0.4 g/dL (ref 0.2–0.5)
Beta Globulin: 0.5 g/dL (ref 0.4–0.6)
Gamma Globulin: 1.6 g/dL (ref 0.8–1.7)
Total Protein: 7.7 g/dL (ref 6.1–8.1)

## 2018-12-31 LAB — HEPATITIS C ANTIBODY
Hepatitis C Ab: NONREACTIVE
SIGNAL TO CUT-OFF: 0.01 (ref <1.00)

## 2018-12-31 LAB — HIV ANTIBODY (ROUTINE TESTING W REFLEX): HIV 1&2 Ab, 4th Generation: NONREACTIVE

## 2018-12-31 LAB — HEPATITIS B CORE ANTIBODY, IGM: Hep B C IgM: NONREACTIVE

## 2018-12-31 LAB — IGG, IGA, IGM
IgG (Immunoglobin G), Serum: 1633 mg/dL — ABNORMAL HIGH (ref 600–1540)
IgM, Serum: 286 mg/dL (ref 50–300)
Immunoglobulin A: 254 mg/dL (ref 70–320)

## 2018-12-31 NOTE — Progress Notes (Signed)
Labs are stable.

## 2019-01-03 NOTE — Telephone Encounter (Signed)
Patient returned the call.  Scheduled new start appointment 10/28 at [place], PharmD, [place], [place] Clinical Specialty Pharmacist [phone]  01/03/2019 2:54 PM

## 2019-01-04 ENCOUNTER — Encounter (HOSPITAL_COMMUNITY): Payer: Federal, State, Local not specified - PPO

## 2019-01-05 ENCOUNTER — Other Ambulatory Visit: Payer: Self-pay

## 2019-01-05 ENCOUNTER — Ambulatory Visit (INDEPENDENT_AMBULATORY_CARE_PROVIDER_SITE_OTHER): Payer: Federal, State, Local not specified - PPO | Admitting: Pharmacist

## 2019-01-05 VITALS — BP 128/60 | HR 65

## 2019-01-05 DIAGNOSIS — Z79899 Other long term (current) drug therapy: Secondary | ICD-10-CM

## 2019-01-05 DIAGNOSIS — L4050 Arthropathic psoriasis, unspecified: Secondary | ICD-10-CM | POA: Diagnosis not present

## 2019-01-05 DIAGNOSIS — L408 Other psoriasis: Secondary | ICD-10-CM

## 2019-01-05 MED ORDER — COSENTYX SENSOREADY (300 MG) 150 MG/ML ~~LOC~~ SOAJ: 300.0000 mg | SUBCUTANEOUS | 0 refills | Status: AC

## 2019-01-05 MED ORDER — COSENTYX SENSOREADY (300 MG) 150 MG/ML ~~LOC~~ SOAJ: 300.0000 mg | Freq: Once | SUBCUTANEOUS | 0 refills | Status: AC

## 2019-01-05 NOTE — Patient Instructions (Addendum)
Continue taking methotrexate 2 tablets weekly along with folic acid 1 mg daily.  Cosentyx Dosing Schedule 300 mg (2 pens) on 11/4, 11/11, 11/18, 11/25 then 150 mg (1 pen) every 14 days starting on 12/9.  **NOTE: you will have an 2 extra sets of pens since you were given samples for first 2 doses**  Standing Labs We placed an order today for your standing lab work.    Please come back and get your standing labs in 1 month and then every 3 months.  We have open lab daily Monday through Thursday from 8:30-12:30 PM and 1:30-4:30 PM and Friday from 8:30-12:30 PM and 1:30-4:00 PM at the office of Dr. Bo Merino.   You may experience shorter wait times on Monday and Friday afternoons. The office is located at [address] [place], [place], [place] [address] No appointment is necessary.   Labs are drawn by [facility].  You may receive a bill from [facility] for your lab work.  If you wish to have your labs drawn at another location, please call the office 24 hours in advance to send orders.  If you have any questions regarding directions or hours of operation,  please call [phone].   Just as a reminder please drink plenty of water prior to coming for your lab work. Thanks!  Vaccines You are taking a medication(s) that can suppress your immune system.  The following immunizations are recommended: . Flu annually . Pneumonia (Pneumovax 23 and Prevnar 13 spaced at least 1 year apart) . Shingrix  Please check with your PCP to make sure you are up to date.  Helpful Tips for Injecting To help alleviate pain and injection site reactions consider the following tips:  . Placing something cold (like and ice gel pack or cold water bottle) on the injection site just before cleansing with alcohol may help reduce pain . If you have a localized reaction (redness, mild swelling, warmth, and itching) you can use topical corticosteroids (hydrocortisone cream) or antihistamine (Claritin) to help  minimize reaction the day of, the day before, and the day after injecting . Always inject this medication at room temperature (remove from refrigerator 15-20 minutes before injecting; this may help eliminate stinging). . Always rotate your injection sites using inside/outside thigh of both legs, abdomen divided into 4 quadrants (stay away from waist line, and 2" away from navel). . Do not inject into areas where skin is tender, bruised, red, or hard or where there are scars or stretch marks. . Always let alcohol dry on the skin before injecting. . Place a cold, damp towel or small ice pack on the injection site for 10 or 15 minutes every 1 to 2 hours if it hurts or is swollen.

## 2019-01-05 NOTE — Progress Notes (Signed)
Pharmacy Note  Subjective:   Patient is being initiated on Cosentyx.  Patient was previously counseled extensively on and consented to initiation of Cosentyx at that time.  Patient presents to clinic today to receive the first dose of Cosentyx.   She was on Remicade IV 5 mg/kg infusions every 8 weeks and methotrexate 2 tablets by mouth once weekly. Her last infusion was on 11/23/2018.   Objective: CMP     Component Value Date/Time   NA 137 12/29/2018 1424   K 4.4 12/29/2018 1424   CL 104 12/29/2018 1424   CO2 24 12/29/2018 1424   GLUCOSE 94 12/29/2018 1424   BUN 13 12/29/2018 1424   CREATININE 1.07 (H) 12/29/2018 1424   CALCIUM 9.4 12/29/2018 1424   PROT 7.7 12/29/2018 1424   PROT 7.5 12/29/2018 1424   ALBUMIN 3.6 10/12/2018 1153   AST 18 12/29/2018 1424   ALT 13 12/29/2018 1424   ALKPHOS 72 10/12/2018 1153   BILITOT 0.4 12/29/2018 1424   GFRNONAA 53 (L) 12/29/2018 1424   GFRAA 61 12/29/2018 1424    CBC    Component Value Date/Time   WBC 6.3 12/29/2018 1424   RBC 3.94 12/29/2018 1424   HGB 13.1 12/29/2018 1424   HCT 36.0 12/29/2018 1424   PLT 255 12/29/2018 1424   MCV 91.4 12/29/2018 1424   MCH 33.2 (H) 12/29/2018 1424   MCHC 36.4 (H) 12/29/2018 1424   RDW 13.0 12/29/2018 1424   LYMPHSABS 2,029 12/29/2018 1424   MONOABS 0.5 10/14/2016 1059   EOSABS 132 12/29/2018 1424   BASOSABS 50 12/29/2018 1424    Baseline Immunosuppressant Therapy Labs TB GOLD Quantiferon TB Gold Latest Ref Rng & Units 06/08/2018  Quantiferon TB Gold Plus Negative Negative   Hepatitis Panel Hepatitis Latest Ref Rng & Units 12/29/2018  Hep B IgM NON-REACTI NON-REACTIVE  Hep C Ab NON-REACTI NON-REACTIVE  Hep C Ab NON-REACTI NON-REACTIVE   HIV Lab Results  Component Value Date   HIV NON-REACTIVE 12/29/2018   Immunoglobulins Immunoglobulin Electrophoresis Latest Ref Rng & Units 12/29/2018  IgA  70 - 320 mg/dL 254  IgG 600 - 1,540 mg/dL 1,633(H)  IgM 50 - 300 mg/dL 286   SPEP Serum  Protein Electrophoresis Latest Ref Rng & Units 12/29/2018  Total Protein 6.1 - 8.1 g/dL 7.5  Albumin 3.8 - 4.8 g/dL -  Alpha-1 0.2 - 0.3 g/dL -  Alpha-2 0.5 - 0.9 g/dL -  Beta Globulin 0.4 - 0.6 g/dL -  Beta 2 0.2 - 0.5 g/dL -  Gamma Globulin 0.8 - 1.7 g/dL -   G6PD No results found for: G6PDH TPMT No results found for: TPMT   Chest x-ray: no active lung disease 08/26/2011  Patient running a fever or have signs/symptoms of infection? No  Assessment/Plan:  Demonstrated proper injection technique with Cosentyx demo pen.  Patient able to demonstrate proper injection technique using the teach back method.  Patient self injected in the lower adbdomen with:  Sample Medication: Cosentyx 150 mg/ml x 2 pens Lot: [id number] Expiration: 10/2019  Patient tolerated well.  Observed for 30 mins in office for adverse reaction and none noted.   Patient dose will be for plaque psoriasis +/- psoriatic arthritis 300 mg every 7 days for 5 weeks then 150 mg every 14 days.  Prescription sent to [facility] required by insurance.  She is eligible to use co-pay card and given information on how to sign up in office.  She was given a sample with same lot  and expiration above to take home for her next dose in case of shipment delays.  Patient brought patient assistance application along with income documents and will fax if needed.  Patient is to return in 1 month for labs and follow up appointment.  Standing orders placed. She is to continue methotrexate.  All questions encouraged and answered.  Instructed patient to call with any further questions or concerns.    Mariella Saa, PharmD, [place], [place] Clinical Specialty Pharmacist [phone]  01/05/2019 11:01 AM

## 2019-01-10 ENCOUNTER — Telehealth: Payer: Self-pay | Admitting: Rheumatology

## 2019-01-10 NOTE — Telephone Encounter (Signed)
Returned call.  [facility] wanted to confirm dosing for 4 week load then 150 mg every 14 days vs a 5 week load then 300 mg every 28 days.  Advised patient received her first dose in office with a sample and we will send maintenance dose for the 150 mg every 14 days once patient returns for labs.  Representative documented and will process prescription.  Nothing further required at this time.   Mariella Saa, PharmD, [place], [place] Clinical Specialty Pharmacist [phone]  01/10/2019 11:27 AM

## 2019-01-10 NOTE — Telephone Encounter (Signed)
[facility] left a voicemail stating they are currently processing patient's prescription of Cosentyx and need to clarify the dose.  Please call back immediately as this is an urgent request.  Phone [phone]

## 2019-01-17 NOTE — Progress Notes (Signed)
Virtual Visit via Telephone Note  I connected with Morgan Jensen on 01/31/19 at 10:40 AM EST by telephone and verified that I am speaking with the correct person using two identifiers.  Location: Patient: Home Provider: Clinic  This service was conducted via virtual visit.    The patient was located at home. I was located in my office.  Consent was obtained prior to the virtual visit and is aware of possible charges through their insurance for this visit.  The patient is an established patient.  Dr. Estanislado Pandy, MD conducted the virtual visit and Hazel Sams, PA-C acted as scribe during the service.  Office staff helped with scheduling follow up visits after the service was conducted.   I discussed the limitations, risks, security and privacy concerns of performing an evaluation and management service by telephone and the availability of in person appointments. I also discussed with the patient that there may be a patient responsible charge related to this service. The patient expressed understanding and agreed to proceed.  CC: Medication follow-up History of Present Illness: Patient is a 68 year old female with a past medical history of psoriatic arthritis and osteoarthritis.  She switched from Remicade infusions to Cosentyx due to experiencing low-grade fevers after each infusion.  Her last Remicade infusion was on 11/23/2018.  She was started on Cosentyx on 01/05/2019.  She has noticed improvement since switching to cosentyx.  She continues have some discomfort in the right knee joint.  She has noticed warmth but no joint swelling.  She denies any other joint pain or joint swelling at this time.  She states her psoriasis has improved considerably.  She states her SI joint pain has also resolved. She denies any morning stiffness or nocturnal pain.   She rates her arthritis a 1/10.   Review of Systems  Constitutional: Negative for fever and malaise/fatigue.  Eyes: Negative for photophobia,  pain, discharge and redness.  Respiratory: Negative for cough, shortness of breath and wheezing.   Cardiovascular: Negative for chest pain and palpitations.  Gastrointestinal: Negative for blood in stool, constipation and diarrhea.  Genitourinary: Negative for dysuria.  Musculoskeletal: Positive for joint pain. Negative for back pain, myalgias and neck pain.  Skin: Negative for rash.  Neurological: Negative for dizziness and headaches.  Psychiatric/Behavioral: Negative for depression. The patient is not nervous/anxious and does not have insomnia.       Observations/Objective:  Physical Exam  Constitutional: She is oriented to person, place, and time.  Neurological: She is alert and oriented to person, place, and time.  Psychiatric: Mood, memory, affect and judgment normal.   Patient reports morning stiffness for 0 minutes.   Patient denies nocturnal pain.  Difficulty dressing/grooming: Denies Difficulty climbing stairs: Denies Difficulty getting out of chair: reports  Difficulty using hands for taps, buttons, cutlery, and/or writing: Denies   Assessment and Plan: Visit Diagnoses: Psoriatic arthritis ([place]): She has not had any recent psoriatic arthritis flares.  She was started on Cosentyx on 01/05/2019 and continues to take methotrexate 2 tablets p.o. once weekly and folic acid 1 mg by mouth daily.  She has been tolerating Cosentyx without any side effects.  No injection site reactions.  She has not had any recent infections. She was previously on Remicade IV infusions every 8 weeks, which she started in 2014, but she had to discontinue after her last infusion on 11/23/2018 due to experiencing recurrent low-grade fevers after each infusion.  In the past she had an inadequate response to [facility]  and Enbrel.  She has noticed clinical improvement since starting on Cosentyx.  Her psoriasis has started to clear.  She has no SI joint pain at this time.  No Achilles tendinitis or plantar  fasciitis.  She continues to have vaginal discomfort and warmth in the right knee joint.  We discussed that if the pain is persistent or worsens we may schedule an MRI in the future.  She has no other joint pain or joint swelling at this time.  She will continue on Cosentyx and methotrexate as prescribed.  She will follow-up in the office in 3 months.  Other psoriasis: Her psoriasis has cleared..  She has halobetasol 0.05% which she applies topically as needed.  High risk medication use -Cosentyx-started on 01/05/19.  D/c Remicade-last infusion 11/23/18 due to low grade fevers. She was originally started on Remicade in 2014.  Her last infusion was on 11/23/18.  She will continue on methotrexate 2.5 mg 2 tablets every 7 days and folic acid 1 mg 1 tablet daily. Prior therapy includes: Stelara (improvement in psoriasis but not joints), Enbrel (improvement in joints but not psoriasis)  Last TB gold negative on 06/08/2018.  CBC and CMP were released today.  Sacroiliitis ([place]): She has no SI joint pain at this time.   Primary osteoarthritis of both knees:  She continues to have persistent discomfort and warmth in the right knee joint.  She has discomfort in the left knee joint at this time.  She has occasional discomfort climbing steps or getting up from a chair to discomfort in the right knee.  Acute pain of right knee - She presents today with right knee joint pain which started in October after twisting her right knee while cleaning.She continues to have mild warmth but no joint swelling.    She is having difficulty climbing steps and getting up from a chair due to discomfort and stiffness.  She is not experiencing any mechanical symptoms at this time.   She is advised to use Voltaren gel topically as needed for pain relief as well as using ice and a compression wrap.  If she fails conservative treatment we will discussed scheduling MRI of the right knee.   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  History of hypercholesterolemia  History of chronic kidney disease  History of anxiety  Vitamin D deficiency  History of hypertension   Follow Up Instructions: She will follow up in 3 months.    I discussed the assessment and treatment plan with the patient. The patient was provided an opportunity to ask questions and all were answered. The patient agreed with the plan and demonstrated an understanding of the instructions.   The patient was advised to call back or seek an in-person evaluation if the symptoms worsen or if the condition fails to improve as anticipated.  I provided 15 minutes of non-face-to-face time during this encounter.   Bo Merino, MD  Scribed by-  Hazel Sams PA-C

## 2019-01-27 ENCOUNTER — Other Ambulatory Visit: Payer: Self-pay | Admitting: Dermatology

## 2019-01-31 ENCOUNTER — Ambulatory Visit (INDEPENDENT_AMBULATORY_CARE_PROVIDER_SITE_OTHER): Payer: Federal, State, Local not specified - PPO | Admitting: Rheumatology

## 2019-01-31 ENCOUNTER — Encounter: Payer: Self-pay | Admitting: Rheumatology

## 2019-01-31 ENCOUNTER — Other Ambulatory Visit: Payer: Self-pay

## 2019-01-31 DIAGNOSIS — L4050 Arthropathic psoriasis, unspecified: Secondary | ICD-10-CM | POA: Diagnosis not present

## 2019-01-31 DIAGNOSIS — L408 Other psoriasis: Secondary | ICD-10-CM

## 2019-01-31 DIAGNOSIS — M170 Bilateral primary osteoarthritis of knee: Secondary | ICD-10-CM

## 2019-01-31 DIAGNOSIS — Z87448 Personal history of other diseases of urinary system: Secondary | ICD-10-CM

## 2019-01-31 DIAGNOSIS — Z8679 Personal history of other diseases of the circulatory system: Secondary | ICD-10-CM

## 2019-01-31 DIAGNOSIS — L920 Granuloma annulare: Secondary | ICD-10-CM

## 2019-01-31 DIAGNOSIS — Z8639 Personal history of other endocrine, nutritional and metabolic disease: Secondary | ICD-10-CM

## 2019-01-31 DIAGNOSIS — Z79899 Other long term (current) drug therapy: Secondary | ICD-10-CM | POA: Diagnosis not present

## 2019-01-31 DIAGNOSIS — E559 Vitamin D deficiency, unspecified: Secondary | ICD-10-CM

## 2019-01-31 DIAGNOSIS — M461 Sacroiliitis, not elsewhere classified: Secondary | ICD-10-CM

## 2019-01-31 DIAGNOSIS — Z8659 Personal history of other mental and behavioral disorders: Secondary | ICD-10-CM

## 2019-02-02 ENCOUNTER — Other Ambulatory Visit: Payer: Self-pay

## 2019-02-02 LAB — CBC WITH DIFFERENTIAL/PLATELET
Absolute Monocytes: 512 cells/uL (ref 200–950)
Basophils Absolute: 49 cells/uL (ref 0–200)
Basophils Relative: 0.8 %
Eosinophils Absolute: 171 cells/uL (ref 15–500)
Eosinophils Relative: 2.8 %
HCT: 38.2 % (ref 35.0–45.0)
Hemoglobin: 12.9 g/dL (ref 11.7–15.5)
Lymphs Abs: 2391 cells/uL (ref 850–3900)
MCH: 29.7 pg (ref 27.0–33.0)
MCHC: 33.8 g/dL (ref 32.0–36.0)
MCV: 87.8 fL (ref 80.0–100.0)
MPV: 10.8 fL (ref 7.5–12.5)
Monocytes Relative: 8.4 %
Neutro Abs: 2977 cells/uL (ref 1500–7800)
Neutrophils Relative %: 48.8 %
Platelets: 264 10*3/uL (ref 140–400)
RBC: 4.35 10*6/uL (ref 3.80–5.10)
RDW: 12.7 % (ref 11.0–15.0)
Total Lymphocyte: 39.2 %
WBC: 6.1 10*3/uL (ref 3.8–10.8)

## 2019-02-02 LAB — COMPLETE METABOLIC PANEL WITH GFR
AG Ratio: 1.2 (calc) (ref 1.0–2.5)
ALT: 17 U/L (ref 6–29)
AST: 18 U/L (ref 10–35)
Albumin: 4.1 g/dL (ref 3.6–5.1)
Alkaline phosphatase (APISO): 67 U/L (ref 37–153)
BUN/Creatinine Ratio: 12 (calc) (ref 6–22)
BUN: 14 mg/dL (ref 7–25)
CO2: 27 mmol/L (ref 20–32)
Calcium: 9.1 mg/dL (ref 8.6–10.4)
Chloride: 103 mmol/L (ref 98–110)
Creat: 1.15 mg/dL — ABNORMAL HIGH (ref 0.50–0.99)
GFR, Est African American: 56 mL/min/{1.73_m2} — ABNORMAL LOW (ref >=60)
GFR, Est Non African American: 49 mL/min/{1.73_m2} — ABNORMAL LOW (ref >=60)
Globulin: 3.4 g/dL (calc) (ref 1.9–3.7)
Glucose, Bld: 145 mg/dL — ABNORMAL HIGH (ref 65–139)
Potassium: 4 mmol/L (ref 3.5–5.3)
Sodium: 137 mmol/L (ref 135–146)
Total Bilirubin: 0.4 mg/dL (ref 0.2–1.2)
Total Protein: 7.5 g/dL (ref 6.1–8.1)

## 2019-02-02 NOTE — Progress Notes (Signed)
CBC WNL.  Creatinine elevated-1.15 and GFR is 49.  Please advise patient to avoid NSAIDs.  Dr. Estanislado Pandy recommends discontinuing MTX.  Glucose is elevated-145.

## 2019-02-14 ENCOUNTER — Ambulatory Visit
Admission: RE | Admit: 2019-02-14 | Discharge: 2019-02-14 | Disposition: A | Discharge status: Home / Self Care | Payer: Federal, State, Local not specified - PPO | Source: Ambulatory Visit | Attending: Family Medicine | Admitting: Family Medicine

## 2019-02-14 ENCOUNTER — Other Ambulatory Visit: Payer: Self-pay

## 2019-02-14 DIAGNOSIS — Z1231 Encounter for screening mammogram for malignant neoplasm of breast: Secondary | ICD-10-CM

## 2019-02-15 ENCOUNTER — Encounter: Payer: Self-pay | Admitting: Rheumatology

## 2019-02-16 MED ORDER — COSENTYX SENSOREADY PEN 150 MG/ML ~~LOC~~ SOAJ: 150.0000 mg | SUBCUTANEOUS | 0 refills | Status: DC

## 2019-02-24 ENCOUNTER — Ambulatory Visit: Payer: Federal, State, Local not specified - PPO | Admitting: Physician Assistant

## 2019-03-25 ENCOUNTER — Ambulatory Visit: Payer: Federal, State, Local not specified - PPO | Attending: Internal Medicine

## 2019-03-25 ENCOUNTER — Other Ambulatory Visit: Payer: Self-pay

## 2019-03-25 ENCOUNTER — Encounter: Payer: Self-pay | Admitting: Rheumatology

## 2019-03-25 DIAGNOSIS — Z20822 Contact with and (suspected) exposure to covid-19: Secondary | ICD-10-CM

## 2019-03-26 LAB — NOVEL CORONAVIRUS, NAA: SARS-CoV-2, NAA: NOT DETECTED

## 2019-04-18 ENCOUNTER — Encounter: Payer: Self-pay | Admitting: Rheumatology

## 2019-04-27 NOTE — Progress Notes (Signed)
Office Visit Note  Patient: Morgan Jensen             Date of Birth: Mar 10, 1951           MRN: [id number]             PCP: Maurice Small, MD Referring: Maurice Small, MD Visit Date: 05/03/2019 Occupation: @GUAROCC @  Subjective:  Other (right knee pain )   History of Present Illness: Morgan Jensen is a 69 y.o. female with history of psoriatic arthritis and osteoarthritis.  She states she has been doing well on Cosentyx.  She twisted her right knee in October which still causes some discomfort.  She denies any other joint discomfort or swelling.  She denies any psoriasis rash.  Activities of Daily Living:  Patient reports morning stiffness for 0 minutes.   Patient Denies nocturnal pain.  Difficulty dressing/grooming: Denies Difficulty climbing stairs: Reports Difficulty getting out of chair: Reports Difficulty using hands for taps, buttons, cutlery, and/or writing: Denies  Review of Systems  Constitutional: Negative for fatigue, night sweats, weight gain and weight loss.  HENT: Negative for mouth sores, trouble swallowing, trouble swallowing, mouth dryness and nose dryness.   Eyes: Negative for pain, redness, itching, visual disturbance and dryness.  Respiratory: Negative for cough, shortness of breath, wheezing and difficulty breathing.   Cardiovascular: Negative for chest pain, palpitations, hypertension, irregular heartbeat and swelling in legs/feet.  Gastrointestinal: Negative for abdominal pain, blood in stool, constipation and diarrhea.  Endocrine: Negative for increased urination.  Genitourinary: Negative for difficulty urinating, painful urination and vaginal dryness.  Musculoskeletal: Negative for arthralgias, joint pain, joint swelling, myalgias, muscle weakness, morning stiffness, muscle tenderness and myalgias.  Skin: Negative for color change, rash, hair loss, skin tightness, ulcers and sensitivity to sunlight.  Allergic/Immunologic: Negative for susceptible to  infections.  Neurological: Negative for dizziness, numbness, headaches, memory loss, night sweats and weakness.  Hematological: Negative for bruising/bleeding tendency and swollen glands.  Psychiatric/Behavioral: Negative for depressed mood, confusion and sleep disturbance. The patient is not nervous/anxious.     PMFS History:  Patient Active Problem List   Diagnosis Date Noted  . Osteoarthritis of knee 01/18/2016  . Hypertension 01/18/2016  . Anxiety 01/18/2016  . Elevated cholesterol 01/18/2016  . CKD (chronic kidney disease) 01/18/2016  . Psoriatic arthritis ([place]) 10/27/2015  . Other psoriasis 10/27/2015    Past Medical History:  Diagnosis Date  . Anxiety 01/18/2016  . Arthritis    psoriatic arthritis   . CKD (chronic kidney disease) 01/18/2016  . Elevated cholesterol 01/18/2016  . Hypertension 01/18/2016  . Osteoarthritis of knee 01/18/2016  . Psoriasis     Family History  Problem Relation Age of Onset  . Aneurysm Mother   . Cancer Father        Lung Cancer  . Cancer Brother        Esopheageal Cancer   Past Surgical History:  Procedure Laterality Date  . BREAST EXCISIONAL BIOPSY Right 1986  . BREAST SURGERY Right 1985   Benign Tumor removed   . LEG SURGERY Left 2004   Fibula and Tibia Rod placement   . TUBAL LIGATION  1993   Social History   Social History Narrative  . Not on file   Immunization History  Administered Date(s) Administered  . Influenza, High Dose Seasonal PF 01/04/2018, 11/18/2018  . Zoster Recombinat (Shingrix) 06/24/2017, 08/26/2017     Objective: Vital Signs: BP 128/77 (BP Location: Left Arm, Patient Position: Sitting, Cuff Size: Normal)  Pulse 69   Resp 12   Ht 5\' 3"  (1.6 m)   Wt 168 lb 12.8 oz (76.6 kg)   BMI 29.90 kg/m    Physical Exam Vitals and nursing note reviewed.  Constitutional:      Appearance: She is well-developed.  HENT:     Head: Normocephalic and atraumatic.  Eyes:     Conjunctiva/sclera: Conjunctivae  normal.  Cardiovascular:     Rate and Rhythm: Normal rate and regular rhythm.     Heart sounds: Normal heart sounds.  Pulmonary:     Effort: Pulmonary effort is normal.     Breath sounds: Normal breath sounds.  Abdominal:     General: Bowel sounds are normal.     Palpations: Abdomen is soft.  Musculoskeletal:     Cervical back: Normal range of motion.  Lymphadenopathy:     Cervical: No cervical adenopathy.  Skin:    General: Skin is warm and dry.     Capillary Refill: Capillary refill takes less than 2 seconds.  Neurological:     Mental Status: She is alert and oriented to person, place, and time.  Psychiatric:        Behavior: Behavior normal.      Musculoskeletal Exam: C-spine thoracic and lumbar spine were in good range of motion.  She had no SI joint tenderness.  Shoulder joints, elbow joints, wrist joints, MCPs and PIPs DIPs with good range of motion with no synovitis.  Hip joints, knee joints, ankles, MTPs and PIPs with good range of motion.  She has some discomfort range of motion of her right knee joint without any warmth swelling or effusion.  CDAI Exam: CDAI Score: -- Patient Global: --; Provider Global: -- Swollen: --; Tender: -- Joint Exam 05/03/2019   No joint exam has been documented for this visit   There is currently no information documented on the homunculus. Go to the Rheumatology activity and complete the homunculus joint exam.  Investigation: No additional findings.  Imaging: No results found.  Recent Labs: Lab Results  Component Value Date   WBC 6.1 02/01/2019   HGB 12.9 02/01/2019   PLT 264 02/01/2019   NA 137 02/01/2019   K 4.0 02/01/2019   CL 103 02/01/2019   CO2 27 02/01/2019   GLUCOSE 145 (H) 02/01/2019   BUN 14 02/01/2019   CREATININE 1.15 (H) 02/01/2019   BILITOT 0.4 02/01/2019   ALKPHOS 72 10/12/2018   AST 18 02/01/2019   ALT 17 02/01/2019   PROT 7.5 02/01/2019   ALBUMIN 3.6 10/12/2018   CALCIUM 9.1 02/01/2019   GFRAA 56 (L)  02/01/2019   QFTBGOLD Negative 07/22/2016   QFTBGOLDPLUS Negative 06/08/2018    Speciality Comments: No specialty comments available.  Procedures:  No procedures performed Allergies: Patient has no known allergies.   Assessment / Plan:     Visit Diagnoses: Psoriatic arthritis ([place]) -patient has been doing well on Cosentyx.  She had no warmth swelling or effusion today.  She also denies any joint stiffness.  Prior therapy includes: Stelara (improvement in psoriasis but not joints), Enbrel (improvement in joints but not psoriasis) . Methotrexate.  Other psoriasis -her psoriasis is well controlled.  Halobetasol 0.05% which she applies topically as needed.  High risk medication use - Cosentyx-started on 01/05/19.  D/c Remicade-last infusion 11/23/18 due to low grade fevers. She was originally started on Remicade in 2014. - Plan: CBC with Differential/Platelet, COMPLETE METABOLIC PANEL WITH GFR, QuantiFERON-TB Gold Plus.  She will get labs every 3  months to monitor for drug toxicity.  Sacroiliitis ([facility])-she had no tenderness over SI joints today.  Primary osteoarthritis of both knees-she had an injury to her right knee joint in October.  She still have some discomfort in her right knee.  No warmth swelling or effusion was noted.  I offered cortisone injection but she would like to hold off at this time.  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  History of hypercholesterolemia  Vitamin D deficiency-she is on vitamin D supplement.  Patient states that she get DEXA through her PCP.  History of hypertension-her blood pressure is well controlled.  History of anxiety  History of chronic kidney disease-her GFR is in 30s which is a stable.  Screening for tuberculosis - Plan: QuantiFERON-TB Gold Plus  Orders: Orders Placed This Encounter  Procedures  . CBC with Differential/Platelet  . COMPLETE METABOLIC PANEL WITH GFR  . QuantiFERON-TB Gold Plus   No orders of the defined  types were placed in this encounter.   .  Follow-Up Instructions: Return in about 5 months (around 09/30/2019) for Psoriatic arthritis.   Bo Merino, MD  Note - This record has been created using Editor, commissioning.  Chart creation errors have been sought, but may not always  have been located. Such creation errors do not reflect on  the standard of medical care.

## 2019-05-03 ENCOUNTER — Other Ambulatory Visit: Payer: Self-pay

## 2019-05-03 ENCOUNTER — Ambulatory Visit: Payer: Federal, State, Local not specified - PPO | Admitting: Rheumatology

## 2019-05-03 ENCOUNTER — Encounter: Payer: Self-pay | Admitting: Rheumatology

## 2019-05-03 VITALS — BP 128/77 | HR 69 | Resp 12 | Ht 63.0 in | Wt 168.8 lb

## 2019-05-03 DIAGNOSIS — Z8679 Personal history of other diseases of the circulatory system: Secondary | ICD-10-CM

## 2019-05-03 DIAGNOSIS — L408 Other psoriasis: Secondary | ICD-10-CM

## 2019-05-03 DIAGNOSIS — L4050 Arthropathic psoriasis, unspecified: Secondary | ICD-10-CM

## 2019-05-03 DIAGNOSIS — Z111 Encounter for screening for respiratory tuberculosis: Secondary | ICD-10-CM

## 2019-05-03 DIAGNOSIS — M461 Sacroiliitis, not elsewhere classified: Secondary | ICD-10-CM | POA: Diagnosis not present

## 2019-05-03 DIAGNOSIS — Z79899 Other long term (current) drug therapy: Secondary | ICD-10-CM

## 2019-05-03 DIAGNOSIS — L920 Granuloma annulare: Secondary | ICD-10-CM

## 2019-05-03 DIAGNOSIS — M170 Bilateral primary osteoarthritis of knee: Secondary | ICD-10-CM

## 2019-05-03 DIAGNOSIS — Z8639 Personal history of other endocrine, nutritional and metabolic disease: Secondary | ICD-10-CM

## 2019-05-03 DIAGNOSIS — Z87448 Personal history of other diseases of urinary system: Secondary | ICD-10-CM

## 2019-05-03 DIAGNOSIS — E559 Vitamin D deficiency, unspecified: Secondary | ICD-10-CM

## 2019-05-03 DIAGNOSIS — Z8659 Personal history of other mental and behavioral disorders: Secondary | ICD-10-CM

## 2019-05-03 NOTE — Patient Instructions (Signed)
Standing Labs We placed an order today for your standing lab work.    Please come back and get your standing labs in May and every 3 months   We have open lab daily Monday through Thursday from 8:30-12:30 PM and 1:30-4:30 PM and Friday from 8:30-12:30 PM and 1:30-4:00 PM at the office of Dr. Perseus Westall.   You may experience shorter wait times on Monday and Friday afternoons. The office is located at [address], [address], [place], [place] [address] No appointment is necessary.   Labs are drawn by [facility].  You may receive a bill from [facility] for your lab work.  If you wish to have your labs drawn at another location, please call the office 24 hours in advance to send orders.  If you have any questions regarding directions or hours of operation,  please call [phone].   Just as a reminder please drink plenty of water prior to coming for your lab work. Thanks!   

## 2019-05-05 LAB — CBC WITH DIFFERENTIAL/PLATELET
Absolute Monocytes: 571 cells/uL (ref 200–950)
Basophils Absolute: 61 cells/uL (ref 0–200)
Basophils Relative: 0.9 %
Eosinophils Absolute: 170 cells/uL (ref 15–500)
Eosinophils Relative: 2.5 %
HCT: 41 % (ref 35.0–45.0)
Hemoglobin: 13.7 g/dL (ref 11.7–15.5)
Lymphs Abs: 2489 cells/uL (ref 850–3900)
MCH: 29.1 pg (ref 27.0–33.0)
MCHC: 33.4 g/dL (ref 32.0–36.0)
MCV: 87.2 fL (ref 80.0–100.0)
MPV: 10.6 fL (ref 7.5–12.5)
Monocytes Relative: 8.4 %
Neutro Abs: 3509 cells/uL (ref 1500–7800)
Neutrophils Relative %: 51.6 %
Platelets: 287 10*3/uL (ref 140–400)
RBC: 4.7 10*6/uL (ref 3.80–5.10)
RDW: 12.1 % (ref 11.0–15.0)
Total Lymphocyte: 36.6 %
WBC: 6.8 10*3/uL (ref 3.8–10.8)

## 2019-05-05 LAB — QUANTIFERON-TB GOLD PLUS
Mitogen-NIL: >10 IU/mL
NIL: 0.04 IU/mL
QuantiFERON-TB Gold Plus: NEGATIVE
TB1-NIL: <0 IU/mL
TB2-NIL: 0 IU/mL

## 2019-05-05 LAB — COMPLETE METABOLIC PANEL WITH GFR
AG Ratio: 1.2 (calc) (ref 1.0–2.5)
ALT: 13 U/L (ref 6–29)
AST: 15 U/L (ref 10–35)
Albumin: 4.2 g/dL (ref 3.6–5.1)
Alkaline phosphatase (APISO): 71 U/L (ref 37–153)
BUN/Creatinine Ratio: 16 (calc) (ref 6–22)
BUN: 17 mg/dL (ref 7–25)
CO2: 26 mmol/L (ref 20–32)
Calcium: 9.2 mg/dL (ref 8.6–10.4)
Chloride: 103 mmol/L (ref 98–110)
Creat: 1.05 mg/dL — ABNORMAL HIGH (ref 0.50–0.99)
GFR, Est African American: 63 mL/min/{1.73_m2} (ref >=60)
GFR, Est Non African American: 54 mL/min/{1.73_m2} — ABNORMAL LOW (ref >=60)
Globulin: 3.6 g/dL (calc) (ref 1.9–3.7)
Glucose, Bld: 101 mg/dL — ABNORMAL HIGH (ref 65–99)
Potassium: 4.6 mmol/L (ref 3.5–5.3)
Sodium: 137 mmol/L (ref 135–146)
Total Bilirubin: 0.4 mg/dL (ref 0.2–1.2)
Total Protein: 7.8 g/dL (ref 6.1–8.1)

## 2019-05-10 ENCOUNTER — Other Ambulatory Visit: Payer: Self-pay | Admitting: Rheumatology

## 2019-05-10 NOTE — Telephone Encounter (Signed)
Last Visit: 05/03/19 Next Visit: 10/04/19 Labs: 05/03/19 CBC WNL. Creatinine is borderline elevated but improving. GFR is mildly low but improving. Rest of CMP WNL  TB Gold: 05/03/19 Neg   Okay to refill per Dr. Estanislado Pandy

## 2019-05-16 ENCOUNTER — Other Ambulatory Visit: Payer: Self-pay | Admitting: Family Medicine

## 2019-05-16 DIAGNOSIS — E2839 Other primary ovarian failure: Secondary | ICD-10-CM

## 2019-06-28 ENCOUNTER — Telehealth: Payer: Self-pay

## 2019-06-28 NOTE — Telephone Encounter (Signed)
Patient had dropped off Handicap placard form to be completed, form has been signed and completed. Will mail to the patient as requested. Will send copy to scan center.   Advised patient form has been completed and placed in the mail. Patient verbalized understanding.

## 2019-07-07 ENCOUNTER — Ambulatory Visit: Payer: Federal, State, Local not specified - PPO

## 2019-07-07 ENCOUNTER — Ambulatory Visit (INDEPENDENT_AMBULATORY_CARE_PROVIDER_SITE_OTHER): Payer: Federal, State, Local not specified - PPO

## 2019-07-07 ENCOUNTER — Other Ambulatory Visit: Payer: Self-pay

## 2019-07-07 ENCOUNTER — Ambulatory Visit
Admission: EM | Admit: 2019-07-07 | Discharge: 2019-07-07 | Disposition: A | Discharge status: Home / Self Care | Payer: Federal, State, Local not specified - PPO | Attending: Emergency Medicine | Admitting: Emergency Medicine

## 2019-07-07 DIAGNOSIS — M25561 Pain in right knee: Secondary | ICD-10-CM | POA: Diagnosis not present

## 2019-07-07 DIAGNOSIS — S8991XA Unspecified injury of right lower leg, initial encounter: Secondary | ICD-10-CM | POA: Diagnosis not present

## 2019-07-07 DIAGNOSIS — M25562 Pain in left knee: Secondary | ICD-10-CM

## 2019-07-07 MED ORDER — PREDNISONE 10 MG (21) PO TBPK: ORAL_TABLET | ORAL | 0 refills | Status: DC

## 2019-07-07 NOTE — ED Triage Notes (Signed)
Heard something pop in right knee when she was climbing steps last evening.  Since then has not been able to put weight on right knee.  Pain is mainly lateral knee  Patient has a history of knee problems with this knee.  Patient injured knee originally back in November 2020

## 2019-07-07 NOTE — ED Provider Notes (Addendum)
[facility]    CSN: [id number] Arrival date & time: 07/07/19  1504      History   Chief Complaint Chief Complaint  Patient presents with  . Knee Pain    HPI Morgan Jensen is a 69 y.o. female.   Who presented to the urgent care with a complaint of right knee pain that started last night.  She reports she was going up the steps and she heard a pop in her right knee.  She localized the pain to the right knee.  She describes the pain as constant and achy, rated at 5 on a scale of 1-10.  She has tried OTC medications without relief.  Her symptoms are made worse with ROM.  She denies similar symptoms in the past.  Denies chills, fever, nausea, vomiting, diarrhea   The history is provided by the patient. No language interpreter was used.  Knee Pain   Past Medical History:  Diagnosis Date  . Anxiety 01/18/2016  . Arthritis    psoriatic arthritis   . CKD (chronic kidney disease) 01/18/2016  . Elevated cholesterol 01/18/2016  . Hypertension 01/18/2016  . Osteoarthritis of knee 01/18/2016  . Psoriasis     Patient Active Problem List   Diagnosis Date Noted  . Osteoarthritis of knee 01/18/2016  . Hypertension 01/18/2016  . Anxiety 01/18/2016  . Elevated cholesterol 01/18/2016  . CKD (chronic kidney disease) 01/18/2016  . Psoriatic arthritis ([place]) 10/27/2015  . Other psoriasis 10/27/2015    Past Surgical History:  Procedure Laterality Date  . BREAST EXCISIONAL BIOPSY Right 1986  . BREAST SURGERY Right 1985   Benign Tumor removed   . LEG SURGERY Left 2004   Fibula and Tibia Rod placement   . TUBAL LIGATION  1993    OB History   No obstetric history on file.      Home Medications    Prior to Admission medications   Medication Sig Start Date End Date Taking? Authorizing Provider  acetaminophen (TYLENOL) 325 MG tablet Take 650 mg by mouth as needed.     [provider]  ALPRAZolam Duanne Moron) 0.25 MG tablet Take 0.25 mg by mouth at bedtime as  needed for anxiety.    [provider]  atorvastatin (LIPITOR) 20 MG tablet Take 20 mg by mouth daily.    [provider]  azelastine (ASTELIN) 0.1 % nasal spray as needed. 01/12/18   [provider]  buPROPion (WELLBUTRIN XL) 150 MG 24 hr tablet Take 150 mg by mouth daily.    [provider]  cetirizine (ZYRTEC) 10 MG tablet as needed. 01/12/18   [provider]  COSENTYX SENSOREADY, 300 MG, 150 MG/ML SOAJ INJECT 150 MG INTO THE SKIN EVERY 14 DAYS 05/10/19   Bo Merino, MD  diclofenac sodium (VOLTAREN) 1 % GEL Voltaren Gel 3 grams to 3 large joints upto TID 3 TUBES with 3 refills Patient taking differently: as needed. Voltaren Gel 3 grams to 3 large joints upto TID 3 TUBES with 3 refills 01/21/16   Panwala, Naitik, PA-C  diphenhydrAMINE (BENADRYL) 25 MG tablet Take 25 mg by mouth once. One hour prior to infusion    [provider]  famotidine (PEPCID) 40 MG tablet TK 1 T PO QD 09/05/18   [provider]  folic acid (FOLVITE) 1 MG tablet Take 1 tablet (1 mg total) by mouth daily. 12/21/17   Bo Merino, MD  lisinopril (PRINIVIL,ZESTRIL) 20 MG tablet TK 1 T PO D 12/28/16   [provider]  Multiple Vitamins-Minerals (CENTRUM SILVER PO) Take 1 tablet by mouth daily.    [provider]  PARoxetine (PAXIL) 20 MG tablet Take 20 mg by mouth daily.    [provider]  predniSONE (STERAPRED UNI-PAK 21 TAB) 10 MG (21) TBPK tablet Take 6 tabs by mouth daily  for 2 days, then 5 tabs for 2 days, then 4 tabs for 2 days, then 3 tabs for 2 days, 2 tabs for 2 days, then 1 tab by mouth daily for 2 days 07/07/19   Emerson Monte, FNP  traZODone (DESYREL) 50 MG tablet Take 50 mg by mouth at bedtime.    [provider]    Family History Family History  Problem Relation Age of Onset  . Aneurysm Mother   . Cancer Father        Lung Cancer  . Cancer Brother        Esopheageal Cancer    Social  History Social History   Tobacco Use  . Smoking status: Former Smoker    Packs/day: 1.00    Years: 10.00    Pack years: 10.00    Types: Cigarettes    Quit date: 03/10/1986    Years since quitting: 33.3  . Smokeless tobacco: Never Used  Substance Use Topics  . Alcohol use: No  . Drug use: No     Allergies   Patient has no known allergies.   Review of Systems Review of Systems  Constitutional: Negative.   Respiratory: Negative.   Cardiovascular: Negative.   Musculoskeletal: Positive for arthralgias and joint swelling.  All other systems reviewed and are negative.    Physical Exam Triage Vital Signs ED Triage Vitals [07/07/19 1502]  Enc Vitals Group     BP (!) 174/82     Pulse Rate 70     Resp 20     Temp 99.2 F (37.3 C)     Temp Source Oral     SpO2 96 %     Weight      Height      Head Circumference      Peak Flow      Pain Score      Pain Loc      Pain Edu?      Excl. in [place]?    No data found.  Updated Vital Signs BP (!) 174/82 (BP Location: Left Arm)   Pulse 70   Temp 99.2 F (37.3 C) (Oral)   Resp 20   SpO2 96%   Visual Acuity Right Eye Distance:   Left Eye Distance:   Bilateral Distance:    Right Eye Near:   Left Eye Near:    Bilateral Near:     Physical Exam Vitals and nursing note reviewed.  Constitutional:      General: She is not in acute distress.    Appearance: Normal appearance. She is normal weight. She is not ill-appearing, toxic-appearing or diaphoretic.  Cardiovascular:     Rate and Rhythm: Normal rate and regular rhythm.     Pulses: Normal pulses.     Heart sounds: Normal heart sounds. No murmur. No friction rub. No gallop.   Pulmonary:     Effort: Pulmonary effort is normal. No respiratory distress.     Breath sounds: Normal breath sounds. No stridor. No wheezing, rhonchi or rales.  Chest:     Chest wall: No tenderness.  Musculoskeletal:        General: Swelling and tenderness present. Normal range  of motion.      Right knee: Swelling present. Tenderness present.     Left knee: Normal.     Right lower leg: No edema.     Left lower leg: No edema.     Comments: Patient is unable to bear weight and ambulate with pain.  No surface trauma or obvious effusion.  No overlying erythema or warmth.  The right knee is with obvious deformity when compared to the left knee.  Patient is unable to deep knee bend, unable  to transfer weight.  No quadricep tenderness.  Negative anterior drawer test with questionable McMurray test  Neurological:     Mental Status: She is alert.      UC Treatments / Results  Labs (all labs ordered are listed, but only abnormal results are displayed) Labs Reviewed - No data to display  EKG   Radiology DG Knee Complete 4 Views Right  Result Date: 07/07/2019 CLINICAL DATA:  Right knee injury yesterday with popping sensation laterally, initial encounter EXAM: RIGHT KNEE - COMPLETE 4+ VIEW COMPARISON:  12/29/2018 FINDINGS: Tricompartmental degenerative changes are noted. No acute fracture or dislocation is seen. Minimal joint effusion is noted. Calcifications are noted along the posterolateral aspect of the knee joint and are stable from the prior exam. No other focal abnormality is noted. IMPRESSION: Degenerative change without acute abnormality. Electronically Signed   By: Inez Catalina M.D.   On: 07/07/2019 15:41    Procedures Procedures (including critical care time)  Medications Ordered in UC Medications - No data to display  Initial Impression / Assessment and Plan / UC Course  I have reviewed the triage vital signs and the nursing notes.  Pertinent labs & imaging results that were available during my care of the patient were reviewed by me and considered in my medical decision making (see chart for details).  Clinical Course as of Jul 06 1601  Thu Jul 07, 2019  1551 DG Knee Complete 4 Views Right [KA]    Clinical Course User Index [KA] Emerson Monte, FNP   Patient  stable for discharge.   X-ray is negative for bony abnormality including fracture or dislocation.  I have reviewed the x-ray myself and the radiologist interpretation.  I am in agreement with the radiologist interpretation.   Was advised to follow-up with primary care to follow-up Ortho.  There is a concern of ligament tear.  Recommend further evaluation with MRI.  Continue to take OTC meloxicam as needed for pain.  Prednisone was prescribed.  Crutches and a splint were applied in office. Final Clinical Impressions(s) / UC Diagnoses   Final diagnoses:  Acute pain of left knee     Discharge Instructions     Your right knee x-ray was negative for acute fracture or dislocation Take prednisone and meloxicam as needed for pain Follow-up with Ortho for further evaluation Return or go to ED for worsening of symptoms    ED Prescriptions    Medication Sig Dispense Auth. Provider   predniSONE (STERAPRED UNI-PAK 21 TAB) 10 MG (21) TBPK tablet Take 6 tabs by mouth daily  for 2 days, then 5 tabs for 2 days, then 4 tabs for 2 days, then 3 tabs for 2 days, 2 tabs for 2 days, then 1 tab by mouth daily for 2 days 42 tablet Abriana Saltos, Darrelyn Hillock, FNP     PDMP not reviewed this encounter.   Emerson Monte, FNP 07/07/19 1603    Emerson Monte, FNP 07/07/19 1606

## 2019-07-07 NOTE — Discharge Instructions (Signed)
Your right knee x-ray was negative for acute fracture or dislocation Take prednisone and meloxicam as needed for pain Follow-up with Ortho for further evaluation Return or go to ED for worsening of symptoms

## 2019-07-29 ENCOUNTER — Ambulatory Visit
Admission: RE | Admit: 2019-07-29 | Discharge: 2019-07-29 | Disposition: A | Discharge status: Home / Self Care | Payer: Federal, State, Local not specified - PPO | Source: Ambulatory Visit | Attending: Family Medicine | Admitting: Family Medicine

## 2019-07-29 ENCOUNTER — Other Ambulatory Visit: Payer: Self-pay

## 2019-07-29 DIAGNOSIS — E2839 Other primary ovarian failure: Secondary | ICD-10-CM

## 2019-08-12 ENCOUNTER — Encounter: Payer: Self-pay | Admitting: Rheumatology

## 2019-08-15 ENCOUNTER — Other Ambulatory Visit: Payer: Self-pay

## 2019-08-15 DIAGNOSIS — Z79899 Other long term (current) drug therapy: Secondary | ICD-10-CM

## 2019-08-15 LAB — CBC WITH DIFFERENTIAL/PLATELET
Absolute Monocytes: 440 cells/uL (ref 200–950)
Basophils Absolute: 42 cells/uL (ref 0–200)
Basophils Relative: 0.8 %
Eosinophils Absolute: 159 cells/uL (ref 15–500)
Eosinophils Relative: 3 %
HCT: 37.5 % (ref 35.0–45.0)
Hemoglobin: 12.8 g/dL (ref 11.7–15.5)
Lymphs Abs: 1696 cells/uL (ref 850–3900)
MCH: 30 pg (ref 27.0–33.0)
MCHC: 34.1 g/dL (ref 32.0–36.0)
MCV: 87.8 fL (ref 80.0–100.0)
MPV: 11.1 fL (ref 7.5–12.5)
Monocytes Relative: 8.3 %
Neutro Abs: 2963 cells/uL (ref 1500–7800)
Neutrophils Relative %: 55.9 %
Platelets: 244 10*3/uL (ref 140–400)
RBC: 4.27 10*6/uL (ref 3.80–5.10)
RDW: 12.4 % (ref 11.0–15.0)
Total Lymphocyte: 32 %
WBC: 5.3 10*3/uL (ref 3.8–10.8)

## 2019-08-15 LAB — COMPLETE METABOLIC PANEL WITH GFR
AG Ratio: 1.4 (calc) (ref 1.0–2.5)
ALT: 15 U/L (ref 6–29)
AST: 18 U/L (ref 10–35)
Albumin: 4.1 g/dL (ref 3.6–5.1)
Alkaline phosphatase (APISO): 61 U/L (ref 37–153)
BUN/Creatinine Ratio: 13 (calc) (ref 6–22)
BUN: 14 mg/dL (ref 7–25)
CO2: 26 mmol/L (ref 20–32)
Calcium: 9.2 mg/dL (ref 8.6–10.4)
Chloride: 103 mmol/L (ref 98–110)
Creat: 1.06 mg/dL — ABNORMAL HIGH (ref 0.60–0.93)
GFR, Est African American: 62 mL/min/{1.73_m2} (ref >=60)
GFR, Est Non African American: 53 mL/min/{1.73_m2} — ABNORMAL LOW (ref >=60)
Globulin: 3 g/dL (calc) (ref 1.9–3.7)
Glucose, Bld: 144 mg/dL — ABNORMAL HIGH (ref 65–99)
Potassium: 4.5 mmol/L (ref 3.5–5.3)
Sodium: 136 mmol/L (ref 135–146)
Total Bilirubin: 0.4 mg/dL (ref 0.2–1.2)
Total Protein: 7.1 g/dL (ref 6.1–8.1)

## 2019-08-16 ENCOUNTER — Other Ambulatory Visit: Payer: Self-pay | Admitting: *Deleted

## 2019-08-16 MED ORDER — COSENTYX SENSOREADY (300 MG) 150 MG/ML ~~LOC~~ SOAJ: SUBCUTANEOUS | 0 refills | Status: DC

## 2019-08-16 NOTE — Telephone Encounter (Signed)
ok 

## 2019-08-16 NOTE — Telephone Encounter (Signed)
Refill request received via fax  Last Visit: 05/03/19 Next Visit: 10/04/19 Labs:  08/15/2019 Glucose 144, Creat 1.06, GFR 53 TB Gold: 05/03/19 Neg   Okay to refill Cosentyx?

## 2019-08-17 ENCOUNTER — Telehealth: Payer: Self-pay | Admitting: Pharmacy Technician

## 2019-08-17 NOTE — Telephone Encounter (Signed)
Submitted a Prior Authorization request to [employer] for [employer] via fax. Will update once we receive a response.  Fax# [phone] Phone# [phone]

## 2019-08-18 NOTE — Telephone Encounter (Signed)
Received notification from [facility] regarding a prior authorization for [place]. Authorization has been APPROVED from 07/19/19 to 02/13/21.   Phone # [phone]

## 2019-09-21 NOTE — Progress Notes (Signed)
Office Visit Note  Patient: Morgan Jensen             Date of Birth: 04-26-1950           MRN: [id number]             PCP: Maurice Small, MD Referring: Maurice Small, MD Visit Date: 10/04/2019 Occupation: @GUAROCC @  Subjective:  Medication management.   History of Present Illness: Morgan Jensen is a 69 y.o. female with history of psoriatic arthritis and psoriasis.  She has been taking Cosentyx on a regular basis.  She has not experienced any side effects.  She has not had any joint pain or joint swelling.  She denies any flares of psoriasis.  She has been experiencing occasional discomfort in her knee joints when she goes up and down the stairs.  Activities of Daily Living:  Patient reports morning stiffness for 0 minutes.   Patient Denies nocturnal pain.  Difficulty dressing/grooming: Denies Difficulty climbing stairs: Reports Difficulty getting out of chair: Reports Difficulty using hands for taps, buttons, cutlery, and/or writing: Denies  Review of Systems  Constitutional: Negative for fatigue, night sweats, weight gain and weight loss.  HENT: Negative for mouth sores, trouble swallowing, trouble swallowing, mouth dryness and nose dryness.   Eyes: Negative for pain, redness, itching, visual disturbance and dryness.  Respiratory: Negative for cough, shortness of breath and difficulty breathing.   Cardiovascular: Negative for chest pain, palpitations, hypertension, irregular heartbeat and swelling in legs/feet.  Gastrointestinal: Negative for blood in stool, constipation and diarrhea.  Endocrine: Negative for increased urination.  Genitourinary: Negative for difficulty urinating and vaginal dryness.  Musculoskeletal: Negative for arthralgias, joint pain, joint swelling, myalgias, muscle weakness, morning stiffness, muscle tenderness and myalgias.  Skin: Negative for color change, rash, hair loss, redness, skin tightness, ulcers and sensitivity to sunlight.  Allergic/Immunologic:  Negative for susceptible to infections.  Neurological: Negative for dizziness, numbness, headaches, memory loss, night sweats and weakness.  Hematological: Negative for bruising/bleeding tendency and swollen glands.  Psychiatric/Behavioral: Negative for depressed mood, confusion and sleep disturbance. The patient is not nervous/anxious.     PMFS History:  Patient Active Problem List   Diagnosis Date Noted  . Osteopenia of multiple sites 10/04/2019  . 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 Osteoarthritis of knee 01/18/2016  . Hypertension 01/18/2016  . Anxiety 01/18/2016  . Elevated cholesterol 01/18/2016  . CKD (chronic kidney disease) 01/18/2016  . Psoriatic arthritis ([place]) 10/27/2015  . Other psoriasis 10/27/2015    Past Medical History:  Diagnosis Date  . Anxiety 01/18/2016  . Arthritis    psoriatic arthritis   . CKD (chronic kidney disease) 01/18/2016  . Elevated cholesterol 01/18/2016  . Hypertension 01/18/2016  . Osteoarthritis of knee 01/18/2016  . Osteopenia    per patient, PCP ordered DEXA   . Psoriasis     Family History  Problem Relation Age of Onset  . Aneurysm Mother   . Cancer Father        Lung Cancer  . Cancer Brother        Esopheageal Cancer   Past Surgical History:  Procedure Laterality Date  . BREAST EXCISIONAL BIOPSY Right 1986  . BREAST SURGERY Right 1985   Benign Tumor removed   . LEG SURGERY Left 2004   Fibula and Tibia Rod placement   . TUBAL LIGATION  1993   Social History   Social History Narrative  . Not on file   Immunization History  Administered Date(s) Administered  . Influenza,  High Dose Seasonal PF 01/04/2018, 11/18/2018  . Zoster Recombinat (Shingrix) 06/24/2017, 08/26/2017     Objective: Vital Signs: BP 124/73 (BP Location: Left Arm, Patient Position: Sitting, Cuff Size: Normal)   Pulse 79   Resp 14   Ht 5\' 2"  (1.575 m)   Wt 170 lb 9.6 oz (77.4 kg)   BMI 31.20 kg/m    Physical Exam Vitals and nursing note reviewed.    Constitutional:      Appearance: She is well-developed.  HENT:     Head: Normocephalic and atraumatic.  Eyes:     Conjunctiva/sclera: Conjunctivae normal.  Cardiovascular:     Rate and Rhythm: Normal rate and regular rhythm.     Heart sounds: Normal heart sounds.  Pulmonary:     Effort: Pulmonary effort is normal.     Breath sounds: Normal breath sounds.  Abdominal:     General: Bowel sounds are normal.     Palpations: Abdomen is soft.  Musculoskeletal:     Cervical back: Normal range of motion.  Lymphadenopathy:     Cervical: No cervical adenopathy.  Skin:    General: Skin is warm and dry.     Capillary Refill: Capillary refill takes less than 2 seconds.  Neurological:     Mental Status: She is alert and oriented to person, place, and time.  Psychiatric:        Behavior: Behavior normal.      Musculoskeletal Exam: C-spine was in good range of motion.  She has mild thoracic kyphosis.  She had mild tenderness over right SI joint.  Shoulder joints, elbow joints, wrist joints with good range of motion.  She has no PIP or DIP synovitis on examination.  Hip joints, knee joints, ankles, MTPs and PIPs with good range of motion.  She has some DIP MP PIP thickening consistent with osteoarthritis. CDAI Exam: CDAI Score: -- Patient Global: --; Provider Global: -- Swollen: --; Tender: -- Joint Exam 10/04/2019   No joint exam has been documented for this visit   There is currently no information documented on the homunculus. Go to the Rheumatology activity and complete the homunculus joint exam.  Investigation: No additional findings.  Imaging: XR Foot 2 Views Left  Result Date: 10/04/2019 First MTP, PIP and DIP narrowing was noted.  No other MTP joint narrowing was noted.  No intertarsal or tibiotalar joint space narrowing was noted.  Mild dorsal spurring was noted. Impression: These findings are consistent with osteoarthritis of the foot.  XR Foot 2 Views Right  Result Date:  10/04/2019 First MTP, PIP and DIP narrowing was noted.  No other MTP joint narrowing was noted.  No intertarsal or tibiotalar joint space narrowing was noted.  Mild dorsal spurring was noted. Impression: These findings are consistent with osteoarthritis of the foot.  XR Hand 2 View Left  Result Date: 10/04/2019 PIP and DIP narrowing was noted.  CMC narrowing was noted.  No MCP, intercarpal radiocarpal joint space narrowing was noted.  No erosive changes were noted. Impression: These findings are consistent with osteoarthritis.  XR Hand 2 View Right  Result Date: 10/04/2019 PIP and DIP narrowing was noted.  CMC narrowing was noted.  No MCP, intercarpal radiocarpal joint space narrowing was noted.  No erosive changes were noted. Impression: These findings are consistent with osteoarthritis.   Recent Labs: Lab Results  Component Value Date   WBC 5.3 08/15/2019   HGB 12.8 08/15/2019   PLT 244 08/15/2019   NA 136 08/15/2019   K  4.5 08/15/2019   CL 103 08/15/2019   CO2 26 08/15/2019   GLUCOSE 144 (H) 08/15/2019   BUN 14 08/15/2019   CREATININE 1.06 (H) 08/15/2019   BILITOT 0.4 08/15/2019   ALKPHOS 72 10/12/2018   AST 18 08/15/2019   ALT 15 08/15/2019   PROT 7.1 08/15/2019   ALBUMIN 3.6 10/12/2018   CALCIUM 9.2 08/15/2019   GFRAA 62 08/15/2019   QFTBGOLD Negative 07/22/2016   QFTBGOLDPLUS NEGATIVE 05/03/2019    Speciality Comments: No specialty comments available.  Procedures:  No procedures performed Allergies: Patient has no known allergies.   Assessment / Plan:     Visit Diagnoses: Psoriatic arthritis ([place]) - Prior therapy includes: Stelara (improvement in psoriasis but not joints), Enbrel (improvement in joints but not psoriasis) . Methotrexate.  Patient is doing very well on Cosentyx.  She had no synovitis or psoriasis today.- Plan: XR Hand 2 View Right, XR Hand 2 View Left, XR Foot 2 Views Right, XR Foot 2 Views Left x-rays obtained today were consistent with osteoarthritis.   No damage from psoriatic arthritis was noted.  Other psoriasis-she had no active psoriasis lesions.  High risk medication use - Cosentyx-started on 01/05/19.  D/c Remicade-last infusion 11/23/18 due to low grade fevers.  Her labs have been stable.  We will continue to monitor labs every 3 months.  Sacroiliitis ([facility])-she has mild chronic SI joint tenderness.  Primary osteoarthritis of both knees-she continues to have some discomfort in her knee joints especially when climbing stairs.  Muscle strength exercises were discussed.  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  History of hypercholesterolemia  History of hypertension  Vitamin D deficiency  History of chronic kidney disease  History of anxiety  Osteopenia of multiple sites - The BMD measured at Femur Neck Left is 0.809 g/cm2 with a T-score of-1.6. 07/29/19.  Use of calcium, vitamin D and resistive exercises were discussed.    Orders: Orders Placed This Encounter  Procedures  . XR Hand 2 View Right  . XR Hand 2 View Left  . XR Foot 2 Views Right  . XR Foot 2 Views Left   No orders of the defined types were placed in this encounter.    Follow-Up Instructions: Return in about 5 months (around 03/05/2020) for Psoriatic arthritis.   Bo Merino, MD  Note - This record has been created using Editor, commissioning.  Chart creation errors have been sought, but may not always  have been located. Such creation errors do not reflect on  the standard of medical care.

## 2019-10-04 ENCOUNTER — Ambulatory Visit: Payer: Self-pay

## 2019-10-04 ENCOUNTER — Encounter: Payer: Self-pay | Admitting: Rheumatology

## 2019-10-04 ENCOUNTER — Other Ambulatory Visit: Payer: Self-pay

## 2019-10-04 ENCOUNTER — Ambulatory Visit: Payer: Federal, State, Local not specified - PPO | Admitting: Rheumatology

## 2019-10-04 VITALS — BP 124/73 | HR 79 | Resp 14 | Ht 62.0 in | Wt 170.6 lb

## 2019-10-04 DIAGNOSIS — M461 Sacroiliitis, not elsewhere classified: Secondary | ICD-10-CM | POA: Diagnosis not present

## 2019-10-04 DIAGNOSIS — Z8639 Personal history of other endocrine, nutritional and metabolic disease: Secondary | ICD-10-CM

## 2019-10-04 DIAGNOSIS — Z8679 Personal history of other diseases of the circulatory system: Secondary | ICD-10-CM

## 2019-10-04 DIAGNOSIS — Z79899 Other long term (current) drug therapy: Secondary | ICD-10-CM | POA: Diagnosis not present

## 2019-10-04 DIAGNOSIS — L920 Granuloma annulare: Secondary | ICD-10-CM | POA: Insufficient documentation

## 2019-10-04 DIAGNOSIS — M170 Bilateral primary osteoarthritis of knee: Secondary | ICD-10-CM

## 2019-10-04 DIAGNOSIS — L4050 Arthropathic psoriasis, unspecified: Secondary | ICD-10-CM | POA: Diagnosis not present

## 2019-10-04 DIAGNOSIS — Z8659 Personal history of other mental and behavioral disorders: Secondary | ICD-10-CM

## 2019-10-04 DIAGNOSIS — Z87448 Personal history of other diseases of urinary system: Secondary | ICD-10-CM

## 2019-10-04 DIAGNOSIS — E559 Vitamin D deficiency, unspecified: Secondary | ICD-10-CM

## 2019-10-04 DIAGNOSIS — M8589 Other specified disorders of bone density and structure, multiple sites: Secondary | ICD-10-CM

## 2019-10-04 DIAGNOSIS — L408 Other psoriasis: Secondary | ICD-10-CM | POA: Diagnosis not present

## 2019-10-04 NOTE — Patient Instructions (Signed)
Standing Labs We placed an order today for your standing lab work.   Please have your standing labs drawn in September and every 3 months  If possible, please have your labs drawn 2 weeks prior to your appointment so that the provider can discuss your results at your appointment.  We have open lab daily Monday through Thursday from 8:30-12:30 PM and 1:30-4:30 PM and Friday from 8:30-12:30 PM and 1:30-4:00 PM at the office of Dr. Bo Merino, [facility].   Please be advised, patients with office appointments requiring lab work will take precedents over walk-in lab work.  If possible, please come for your lab work on Monday and Friday afternoons, as you may experience shorter wait times. The office is located at [address] [place], [place], [place] [address] No appointment is necessary.   Labs are drawn by [facility]. Please bring your co-pay at the time of your lab draw.  You may receive a bill from [facility] for your lab work.  If you wish to have your labs drawn at another location, please call the office 24 hours in advance to send orders.  If you have any questions regarding directions or hours of operation,  please call [phone].   As a reminder, please drink plenty of water prior to coming for your lab work. Thanks!  Vaccines You are taking a medication(s) that can suppress your immune system.  The following immunizations are recommended: . Flu annually . Covid-19  o Please continue to wear your mask, practice social distancing and hand hygiene if you are on any medications that suppress your immune system o If you are on methotrexate, Cellcept (mycophenolate), Rinvoq, Morrie Sheldon, and Olumiant - Hold medication for 1 week after each vaccine dose o If you are on Orencia Subcutaneous injections - Hold medication both one week prior to and one week after the first vaccine dose (only) o If you are on Orencia IV infusions - Time vaccine administration so that the  first vaccination will occur four weeks after infusion . Pneumonia (Pneumovax 23 and Prevnar 13 spaced at least 1 year apart) . Shingrix (after age 70)  Please check with your PCP to make sure you are up to date.   If you get COVID-19 infection please contact your doctors office so that you can be referred to a COVID-19 antibody infusion clinic

## 2019-11-01 ENCOUNTER — Other Ambulatory Visit: Payer: Self-pay | Admitting: *Deleted

## 2019-11-01 MED ORDER — COSENTYX SENSOREADY (300 MG) 150 MG/ML ~~LOC~~ SOAJ: SUBCUTANEOUS | 0 refills | Status: DC

## 2019-11-01 NOTE — Telephone Encounter (Signed)
Refill request received via fax  Last Visit: 10/04/2019 Next Visit: 03/14/2020 Labs: 08/15/2019 Creatinine is mildly elevated but stable-1.06. GFR is 53.  Glucose is elevated-144. Rest of CMP WNL. CBC WNL. TB Gold: 05/03/2019 Neg   Current Dose per office note 10/04/2019: dose not discussed.   MG:EEATVVLRT arthritis   Okay to refill Cosentyx?

## 2019-12-08 ENCOUNTER — Encounter: Payer: Self-pay | Admitting: Rheumatology

## 2019-12-09 ENCOUNTER — Other Ambulatory Visit: Payer: Self-pay

## 2019-12-09 DIAGNOSIS — Z79899 Other long term (current) drug therapy: Secondary | ICD-10-CM

## 2019-12-10 LAB — CBC WITH DIFFERENTIAL/PLATELET
Absolute Monocytes: 626 cells/uL (ref 200–950)
Basophils Absolute: 61 cells/uL (ref 0–200)
Basophils Relative: 0.9 %
Eosinophils Absolute: 211 cells/uL (ref 15–500)
Eosinophils Relative: 3.1 %
HCT: 38.7 % (ref 35.0–45.0)
Hemoglobin: 13 g/dL (ref 11.7–15.5)
Lymphs Abs: 2135 cells/uL (ref 850–3900)
MCH: 29.6 pg (ref 27.0–33.0)
MCHC: 33.6 g/dL (ref 32.0–36.0)
MCV: 88.2 fL (ref 80.0–100.0)
MPV: 11.3 fL (ref 7.5–12.5)
Monocytes Relative: 9.2 %
Neutro Abs: 3767 cells/uL (ref 1500–7800)
Neutrophils Relative %: 55.4 %
Platelets: 250 10*3/uL (ref 140–400)
RBC: 4.39 10*6/uL (ref 3.80–5.10)
RDW: 12.2 % (ref 11.0–15.0)
Total Lymphocyte: 31.4 %
WBC: 6.8 10*3/uL (ref 3.8–10.8)

## 2019-12-10 LAB — COMPLETE METABOLIC PANEL WITH GFR
AG Ratio: 1.4 (calc) (ref 1.0–2.5)
ALT: 14 U/L (ref 6–29)
AST: 16 U/L (ref 10–35)
Albumin: 4.2 g/dL (ref 3.6–5.1)
Alkaline phosphatase (APISO): 60 U/L (ref 37–153)
BUN/Creatinine Ratio: 16 (calc) (ref 6–22)
BUN: 17 mg/dL (ref 7–25)
CO2: 25 mmol/L (ref 20–32)
Calcium: 9.6 mg/dL (ref 8.6–10.4)
Chloride: 101 mmol/L (ref 98–110)
Creat: 1.07 mg/dL — ABNORMAL HIGH (ref 0.60–0.93)
GFR, Est African American: 61 mL/min/{1.73_m2} (ref >=60)
GFR, Est Non African American: 53 mL/min/{1.73_m2} — ABNORMAL LOW (ref >=60)
Globulin: 3.1 g/dL (calc) (ref 1.9–3.7)
Glucose, Bld: 121 mg/dL — ABNORMAL HIGH (ref 65–99)
Potassium: 4.5 mmol/L (ref 3.5–5.3)
Sodium: 136 mmol/L (ref 135–146)
Total Bilirubin: 0.4 mg/dL (ref 0.2–1.2)
Total Protein: 7.3 g/dL (ref 6.1–8.1)

## 2019-12-10 NOTE — Progress Notes (Signed)
Glucose is mildly elevated. GFR is low and stable. Please forward labs to her PCP. CBC is normal.

## 2020-02-29 NOTE — Progress Notes (Signed)
Office Visit Note  Patient: Morgan Jensen             Date of Birth: 1950/07/21           MRN: [id number]             PCP: Maurice Small, MD Referring: Maurice Small, MD Visit Date: 03/14/2020 Occupation: @GUAROCC @  Subjective:  Right knee pain.   History of Present Illness: Morgan Jensen is a 69 y.o. female history of psoriatic arthritis, psoriasis, osteoporosis and osteopenia.  She states she has been tolerating Cosentyx well and has been taking her injections every 2 weeks.  She denies any psoriasis.  She states she continues to have some discomfort in her right knee joint.  She was seen by Dr. Delilah Shan at [facility] and is a scheduled to have MRI of her right knee joint.  She also noticed some puffiness in her left knee joint which resolved.  Activities of Daily Living:  Patient reports morning stiffness for 0 minutes.   Patient Reports nocturnal pain.  Difficulty dressing/grooming: Denies Difficulty climbing stairs: Reports Difficulty getting out of chair: Denies Difficulty using hands for taps, buttons, cutlery, and/or writing: Denies  Review of Systems  Constitutional: Negative for fatigue.  HENT: Negative for mouth sores, mouth dryness and nose dryness.   Eyes: Positive for dryness. Negative for pain and itching.  Respiratory: Negative for shortness of breath and difficulty breathing.   Cardiovascular: Negative for chest pain and palpitations.  Gastrointestinal: Negative for blood in stool, constipation and diarrhea.  Endocrine: Negative for increased urination.  Genitourinary: Negative for difficulty urinating.  Musculoskeletal: Positive for myalgias, muscle tenderness and myalgias. Negative for arthralgias, joint pain, joint swelling and morning stiffness.  Skin: Negative for color change, rash and redness.  Allergic/Immunologic: Negative for susceptible to infections.  Neurological: Negative for dizziness, numbness, headaches, memory loss and weakness.  Hematological:  Negative for bruising/bleeding tendency.  Psychiatric/Behavioral: Negative for confusion.    PMFS History:  Patient Active Problem List   Diagnosis Date Noted  . Osteopenia of multiple sites 10/04/2019  . 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 Osteoarthritis of knee 01/18/2016  . Hypertension 01/18/2016  . Anxiety 01/18/2016  . Elevated cholesterol 01/18/2016  . CKD (chronic kidney disease) 01/18/2016  . Psoriatic arthritis ([place]) 10/27/2015  . Other psoriasis 10/27/2015    Past Medical History:  Diagnosis Date  . Anxiety 01/18/2016  . Arthritis    psoriatic arthritis   . CKD (chronic kidney disease) 01/18/2016  . Elevated cholesterol 01/18/2016  . Hypertension 01/18/2016  . Osteoarthritis of knee 01/18/2016  . Osteopenia    per patient, PCP ordered DEXA   . Psoriasis     Family History  Problem Relation Age of Onset  . Aneurysm Mother   . Cancer Father        Lung Cancer  . Cancer Brother        Esopheageal Cancer   Past Surgical History:  Procedure Laterality Date  . BREAST EXCISIONAL BIOPSY Right 1986  . BREAST SURGERY Right 1985   Benign Tumor removed   . LEG SURGERY Left 2004   Fibula and Tibia Rod placement   . TUBAL LIGATION  1993   Social History   Social History Narrative  . Not on file   Immunization History  Administered Date(s) Administered  . Influenza, High Dose Seasonal PF 01/04/2018, 11/18/2018  . PFIZER SARS-COV-2 Vaccination 04/05/2019, 04/26/2019, 12/13/2019  . Zoster Recombinat (Shingrix) 06/24/2017, 08/26/2017     Objective: Vital  Signs: BP 116/71 (BP Location: Left Arm, Patient Position: Sitting, Cuff Size: Normal)   Pulse 69   Resp 15   Ht 5\' 2"  (1.575 m)   Wt 172 lb 9.6 oz (78.3 kg)   BMI 31.57 kg/m    Physical Exam Vitals and nursing note reviewed.  Constitutional:      Appearance: She is well-developed and well-nourished.  HENT:     Head: Normocephalic and atraumatic.  Eyes:     Extraocular Movements: EOM normal.      Conjunctiva/sclera: Conjunctivae normal.  Cardiovascular:     Rate and Rhythm: Normal rate and regular rhythm.     Pulses: Intact distal pulses.     Heart sounds: Normal heart sounds.  Pulmonary:     Effort: Pulmonary effort is normal.     Breath sounds: Normal breath sounds.  Abdominal:     General: Bowel sounds are normal.     Palpations: Abdomen is soft.  Musculoskeletal:     Cervical back: Normal range of motion.  Lymphadenopathy:     Cervical: No cervical adenopathy.  Skin:    General: Skin is warm and dry.     Capillary Refill: Capillary refill takes less than 2 seconds.  Neurological:     Mental Status: She is alert and oriented to person, place, and time.  Psychiatric:        Mood and Affect: Mood and affect normal.        Behavior: Behavior normal.      Musculoskeletal Exam: C-spine was in good range of motion.  Shoulder joints, elbow joints, wrist joints, MCPs PIPs and DIPs with good range of motion with no synovitis.  Hip joints, knee joints, ankles, MTPs PIPs and DIPs with good range of motion with no synovitis.  CDAI Exam: CDAI Score: -- Patient Global: --; Provider Global: -- Swollen: --; Tender: -- Joint Exam 03/14/2020   No joint exam has been documented for this visit   There is currently no information documented on the homunculus. Go to the Rheumatology activity and complete the homunculus joint exam.  Investigation: No additional findings.  Imaging: No results found.  Recent Labs: Lab Results  Component Value Date   WBC 6.8 12/09/2019   HGB 13.0 12/09/2019   PLT 250 12/09/2019   NA 136 12/09/2019   K 4.5 12/09/2019   CL 101 12/09/2019   CO2 25 12/09/2019   GLUCOSE 121 (H) 12/09/2019   BUN 17 12/09/2019   CREATININE 1.07 (H) 12/09/2019   BILITOT 0.4 12/09/2019   ALKPHOS 72 10/12/2018   AST 16 12/09/2019   ALT 14 12/09/2019   PROT 7.3 12/09/2019   ALBUMIN 3.6 10/12/2018   CALCIUM 9.6 12/09/2019   GFRAA 61 12/09/2019   QFTBGOLD  Negative 07/22/2016   QFTBGOLDPLUS NEGATIVE 05/03/2019    Speciality Comments: No specialty comments available.  Procedures:  No procedures performed Allergies: Patient has no known allergies.   Assessment / Plan:     Visit Diagnoses: Psoriatic arthritis ([place]) - Prior therapy includes: Stelara (improvement in psoriasis but not joints), Enbrel (improvement in joints but not psoriasis) . Methotrexate-elevated creatinine.  She is clinically doing very well on Cosentyx 150 mg subcu every 2 weeks.  She denies any joint pain or joint swelling.  She continues to have some discomfort in her right knee joint for which she has been seeing Dr. Delilah Shan at [facility].  Other psoriasis-she has no active psoriasis lesions.  High risk medication use - Cosentyx 150 mg sq q 2  weeks-started on 01/05/19.  D/c Remicade-last infusion 11/23/18 due to low grade fevers. - Plan: CBC with Differential/Platelet, COMPLETE METABOLIC PANEL WITH GFR, QuantiFERON-TB Gold Plus today.  Labs which include CBC and CMP with GFR would be every 3 months to monitor for drug toxicity.  She is fully vaccinated against COVID-19.  She also received a booster.  Use of mask, social distancing and hand hygiene was discussed.  Sacroiliitis (HCC)-she has off-and-on discomfort.  Today she had no tenderness.  Primary osteoarthritis of both knees-she continues to have discomfort in her right knee joint.  She will be getting MRI through Dr. Scarlette Ar office.  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  History of hypercholesterolemia  History of hypertension-blood pressure is stable.  Vitamin D deficiency -she has been taking vitamin D.  Plan: VITAMIN D 25 Hydroxy (Vit-D Deficiency, Fractures)  History of anxiety  History of chronic kidney disease-GFR is a stable.  Osteopenia of multiple sites - The BMD measured at Femur Neck Left is 0.809 g/cm2 with a T-score of-1.6. 07/29/19.  DEXA results were discussed.  Use of calcium,  vitamin D and resistive exercises were emphasized.  BMI 31.57-weight loss diet and exercise was discussed.  A handout was given on the heart healthy diet.  Orders: Orders Placed This Encounter  Procedures  . CBC with Differential/Platelet  . COMPLETE METABOLIC PANEL WITH GFR  . QuantiFERON-TB Gold Plus  . VITAMIN D 25 Hydroxy (Vit-D Deficiency, Fractures)   No orders of the defined types were placed in this encounter.    Follow-Up Instructions: Return in about 5 months (around 08/12/2020) for Psoriatic arthritis, Osteoarthritis.   Bo Merino, MD  Note - This record has been created using Editor, commissioning.  Chart creation errors have been sought, but may not always  have been located. Such creation errors do not reflect on  the standard of medical care.

## 2020-03-14 ENCOUNTER — Encounter: Payer: Self-pay | Admitting: Rheumatology

## 2020-03-14 ENCOUNTER — Ambulatory Visit: Payer: Federal, State, Local not specified - PPO | Admitting: Rheumatology

## 2020-03-14 ENCOUNTER — Other Ambulatory Visit: Payer: Self-pay

## 2020-03-14 VITALS — BP 116/71 | HR 69 | Resp 15 | Ht 62.0 in | Wt 172.6 lb

## 2020-03-14 DIAGNOSIS — Z8639 Personal history of other endocrine, nutritional and metabolic disease: Secondary | ICD-10-CM

## 2020-03-14 DIAGNOSIS — Z8679 Personal history of other diseases of the circulatory system: Secondary | ICD-10-CM

## 2020-03-14 DIAGNOSIS — M461 Sacroiliitis, not elsewhere classified: Secondary | ICD-10-CM

## 2020-03-14 DIAGNOSIS — M8589 Other specified disorders of bone density and structure, multiple sites: Secondary | ICD-10-CM

## 2020-03-14 DIAGNOSIS — L4050 Arthropathic psoriasis, unspecified: Secondary | ICD-10-CM

## 2020-03-14 DIAGNOSIS — Z8659 Personal history of other mental and behavioral disorders: Secondary | ICD-10-CM

## 2020-03-14 DIAGNOSIS — Z87448 Personal history of other diseases of urinary system: Secondary | ICD-10-CM

## 2020-03-14 DIAGNOSIS — Z79899 Other long term (current) drug therapy: Secondary | ICD-10-CM

## 2020-03-14 DIAGNOSIS — L408 Other psoriasis: Secondary | ICD-10-CM

## 2020-03-14 DIAGNOSIS — E559 Vitamin D deficiency, unspecified: Secondary | ICD-10-CM

## 2020-03-14 DIAGNOSIS — L920 Granuloma annulare: Secondary | ICD-10-CM

## 2020-03-14 DIAGNOSIS — M170 Bilateral primary osteoarthritis of knee: Secondary | ICD-10-CM

## 2020-03-14 NOTE — Patient Instructions (Addendum)
Standing Labs We placed an order today for your standing lab work.   Please have your standing labs drawn in April and every 3 months  If possible, please have your labs drawn 2 weeks prior to your appointment so that the provider can discuss your results at your appointment.  We have open lab daily Monday through Thursday from 8:30-12:30 PM and 1:30-4:30 PM and Friday from 8:30-12:30 PM and 1:30-4:00 PM at the office of Dr. Develle Sievers, [facility].   Please be advised, patients with office appointments requiring lab work will take precedents over walk-in lab work.  If possible, please come for your lab work on Monday and Friday afternoons, as you may experience shorter wait times. The office is located at [address], [address], [place], [place] [address] No appointment is necessary.   Labs are drawn by [facility]. Please bring your co-pay at the time of your lab draw.  You may receive a bill from [facility] for your lab work.  If you wish to have your labs drawn at another location, please call the office 24 hours in advance to send orders.  If you have any questions regarding directions or hours of operation,  please call [phone].   As a reminder, please drink plenty of water prior to coming for your lab work. Thanks!   Heart Disease Prevention   Your inflammatory disease increases your risk of heart disease which includes heart attack, stroke, atrial fibrillation (irregular heartbeats), high blood pressure, heart failure and atherosclerosis (plaque in the arteries).  It is important to reduce your risk by:   . Keep blood pressure, cholesterol, and blood sugar at healthy levels   . Smoking Cessation   . Maintain a healthy weight  o BMI 20-25   . Eat a healthy diet  o Plenty of fresh fruit, vegetables, and whole grains  o Limit saturated fats, foods high in sodium, and added sugars  o DASH and Mediterranean diet   . Increase physical activity  o Recommend  moderate physically activity for 150 minutes per week/ 30 minutes a day for five days a week These can be broken up into three separate ten-minute sessions during the day.   . Reduce Stress  . Meditation, slow breathing exercises, yoga, coloring books  . Dental visits twice a year   

## 2020-03-15 ENCOUNTER — Encounter: Payer: Self-pay | Admitting: Rheumatology

## 2020-03-15 LAB — VITAMIN D 25 HYDROXY (VIT D DEFICIENCY, FRACTURES): Vit D, 25-Hydroxy: 32 ng/mL (ref 30–100)

## 2020-03-15 MED ORDER — COSENTYX SENSOREADY (300 MG) 150 MG/ML ~~LOC~~ SOAJ: SUBCUTANEOUS | 0 refills | Status: DC

## 2020-03-15 NOTE — Progress Notes (Signed)
CBC and vitamin D are normal.  GFR is low and stable.  Most likely due to the use of ACE inhibitor.  Please forward labs to her PCP.

## 2020-03-15 NOTE — Telephone Encounter (Signed)
Last Visit: 1/5/022 Next Visit: 08/15/2019 Labs: 03/14/2020, CBC and vitamin D are normal. GFR is low and stable. Most likely due to the use of ACE inhibitor.  TB Gold:  Active, 03/14/2020, 05/03/2019 negative  Current Dose per office note 03/14/2020: Cosentyx 150 mg subcu every 2 weeks  DX:  Psoriatic arthritis   Okay to refill Cosentyx?

## 2020-03-16 LAB — COMPLETE METABOLIC PANEL WITH GFR
AG Ratio: 1.3 (calc) (ref 1.0–2.5)
ALT: 15 U/L (ref 6–29)
AST: 18 U/L (ref 10–35)
Albumin: 4.3 g/dL (ref 3.6–5.1)
Alkaline phosphatase (APISO): 65 U/L (ref 37–153)
BUN/Creatinine Ratio: 15 (calc) (ref 6–22)
BUN: 17 mg/dL (ref 7–25)
CO2: 27 mmol/L (ref 20–32)
Calcium: 9.3 mg/dL (ref 8.6–10.4)
Chloride: 104 mmol/L (ref 98–110)
Creat: 1.11 mg/dL — ABNORMAL HIGH (ref 0.60–0.93)
GFR, Est African American: 58 mL/min/{1.73_m2} — ABNORMAL LOW (ref >=60)
GFR, Est Non African American: 50 mL/min/{1.73_m2} — ABNORMAL LOW (ref >=60)
Globulin: 3.2 g/dL (calc) (ref 1.9–3.7)
Glucose, Bld: 84 mg/dL (ref 65–99)
Potassium: 4.6 mmol/L (ref 3.5–5.3)
Sodium: 138 mmol/L (ref 135–146)
Total Bilirubin: 0.4 mg/dL (ref 0.2–1.2)
Total Protein: 7.5 g/dL (ref 6.1–8.1)

## 2020-03-16 LAB — CBC WITH DIFFERENTIAL/PLATELET
Absolute Monocytes: 640 cells/uL (ref 200–950)
Basophils Absolute: 51 cells/uL (ref 0–200)
Basophils Relative: 0.8 %
Eosinophils Absolute: 192 cells/uL (ref 15–500)
Eosinophils Relative: 3 %
HCT: 39 % (ref 35.0–45.0)
Hemoglobin: 13.4 g/dL (ref 11.7–15.5)
Lymphs Abs: 2285 cells/uL (ref 850–3900)
MCH: 30.2 pg (ref 27.0–33.0)
MCHC: 34.4 g/dL (ref 32.0–36.0)
MCV: 87.8 fL (ref 80.0–100.0)
MPV: 11.1 fL (ref 7.5–12.5)
Monocytes Relative: 10 %
Neutro Abs: 3232 cells/uL (ref 1500–7800)
Neutrophils Relative %: 50.5 %
Platelets: 269 10*3/uL (ref 140–400)
RBC: 4.44 10*6/uL (ref 3.80–5.10)
RDW: 12.2 % (ref 11.0–15.0)
Total Lymphocyte: 35.7 %
WBC: 6.4 10*3/uL (ref 3.8–10.8)

## 2020-03-16 LAB — QUANTIFERON-TB GOLD PLUS
Mitogen-NIL: >10 IU/mL
NIL: 0.02 IU/mL
QuantiFERON-TB Gold Plus: NEGATIVE
TB1-NIL: 0.01 IU/mL
TB2-NIL: 0.01 IU/mL

## 2020-03-18 NOTE — Progress Notes (Signed)
TB Gold is negative and Vit D is normal.

## 2020-03-21 ENCOUNTER — Telehealth: Payer: Self-pay | Admitting: Pharmacy Technician

## 2020-03-21 NOTE — Telephone Encounter (Signed)
Submitted a Non-formulary Prior Authorization request to [employer] for [employer] via Fax. Will update once we receive a response.   Phone#[phone]

## 2020-03-23 ENCOUNTER — Encounter: Payer: Self-pay | Admitting: Rheumatology

## 2020-03-27 NOTE — Telephone Encounter (Signed)
Received fax from [place] stating PA is not needed at this time for Cosentyx.

## 2020-03-30 ENCOUNTER — Other Ambulatory Visit: Payer: Self-pay | Admitting: Family Medicine

## 2020-03-30 DIAGNOSIS — Z1231 Encounter for screening mammogram for malignant neoplasm of breast: Secondary | ICD-10-CM

## 2020-05-14 ENCOUNTER — Ambulatory Visit
Admission: RE | Admit: 2020-05-14 | Discharge: 2020-05-14 | Disposition: A | Discharge status: Home / Self Care | Payer: Federal, State, Local not specified - PPO | Source: Ambulatory Visit | Attending: Family Medicine | Admitting: Family Medicine

## 2020-05-14 ENCOUNTER — Other Ambulatory Visit: Payer: Self-pay

## 2020-05-14 DIAGNOSIS — Z1231 Encounter for screening mammogram for malignant neoplasm of breast: Secondary | ICD-10-CM

## 2020-05-24 ENCOUNTER — Other Ambulatory Visit: Payer: Self-pay | Admitting: Rheumatology

## 2020-05-24 NOTE — Telephone Encounter (Signed)
Next Visit: 08/14/2020  Last Visit: 03/14/2020  Last Fill: 03/15/2020  XO:VANVBTYOM arthritis   Current Dose per office note 03/14/2020,  Cosentyx 150 mg sq q 2 weeks Labs: 03/14/2020, CBC and vitamin D are normal. GFR is low and stable. Most likely due to the use of ACE inhibitor. Please forward labs to her PCP. TB Gold is negative and Vit D is normal.  TB Gold: 03/14/2020, negative  Okay to refill Cosentyx?

## 2020-06-28 ENCOUNTER — Other Ambulatory Visit: Payer: Self-pay | Admitting: *Deleted

## 2020-06-28 DIAGNOSIS — Z79899 Other long term (current) drug therapy: Secondary | ICD-10-CM

## 2020-06-29 LAB — CBC WITH DIFFERENTIAL/PLATELET
Absolute Monocytes: 537 cells/uL (ref 200–950)
Basophils Absolute: 48 cells/uL (ref 0–200)
Basophils Relative: 0.7 %
Eosinophils Absolute: 170 cells/uL (ref 15–500)
Eosinophils Relative: 2.5 %
HCT: 39 % (ref 35.0–45.0)
Hemoglobin: 13.2 g/dL (ref 11.7–15.5)
Lymphs Abs: 2169 cells/uL (ref 850–3900)
MCH: 30 pg (ref 27.0–33.0)
MCHC: 33.8 g/dL (ref 32.0–36.0)
MCV: 88.6 fL (ref 80.0–100.0)
MPV: 11.3 fL (ref 7.5–12.5)
Monocytes Relative: 7.9 %
Neutro Abs: 3876 cells/uL (ref 1500–7800)
Neutrophils Relative %: 57 %
Platelets: 249 10*3/uL (ref 140–400)
RBC: 4.4 10*6/uL (ref 3.80–5.10)
RDW: 12.4 % (ref 11.0–15.0)
Total Lymphocyte: 31.9 %
WBC: 6.8 10*3/uL (ref 3.8–10.8)

## 2020-06-29 LAB — COMPLETE METABOLIC PANEL WITH GFR
AG Ratio: 1.4 (calc) (ref 1.0–2.5)
ALT: 15 U/L (ref 6–29)
AST: 16 U/L (ref 10–35)
Albumin: 4.1 g/dL (ref 3.6–5.1)
Alkaline phosphatase (APISO): 60 U/L (ref 37–153)
BUN/Creatinine Ratio: 17 (calc) (ref 6–22)
BUN: 19 mg/dL (ref 7–25)
CO2: 27 mmol/L (ref 20–32)
Calcium: 9.3 mg/dL (ref 8.6–10.4)
Chloride: 104 mmol/L (ref 98–110)
Creat: 1.09 mg/dL — ABNORMAL HIGH (ref 0.60–0.93)
GFR, Est African American: 59 mL/min/{1.73_m2} — ABNORMAL LOW (ref >=60)
GFR, Est Non African American: 51 mL/min/{1.73_m2} — ABNORMAL LOW (ref >=60)
Globulin: 2.9 g/dL (calc) (ref 1.9–3.7)
Glucose, Bld: 140 mg/dL — ABNORMAL HIGH (ref 65–99)
Potassium: 4.6 mmol/L (ref 3.5–5.3)
Sodium: 138 mmol/L (ref 135–146)
Total Bilirubin: 0.4 mg/dL (ref 0.2–1.2)
Total Protein: 7 g/dL (ref 6.1–8.1)

## 2020-07-31 NOTE — Progress Notes (Signed)
Office Visit Note  Patient: Morgan Jensen             Date of Birth: [birth year]           MRN: [id number]             PCP: Maurice Small, MD Referring: Maurice Small, MD Visit Date: 08/14/2020 Occupation: @GUAROCC @  Subjective:  Other (Discomfort in bilateral knees)   History of Present Illness: Morgan Jensen is a 71 y.o. female with history of psoriatic arthritis, psoriasis and osteoarthritis.  She states the Cosentyx has been working well for her.  She has not seen any joint inflammation.  She denies any SI joint pain.  She states recently she has been having pain and discomfort in her bilateral knee joints.  She has difficulty with prolonged standing and walking.  She also has difficulty getting up from the chair.  She states she has seen Dr. Delilah Shan in the past for right knee joint pain and had MRI of her knee joint.  She had cortisone injection approximately 1 year ago which gave her some relief temporarily.  Activities of Daily Living:  Patient reports morning stiffness for 0 minutes.   Patient Denies nocturnal pain.  Difficulty dressing/grooming: Denies Difficulty climbing stairs: Reports Difficulty getting out of chair: Reports Difficulty using hands for taps, buttons, cutlery, and/or writing: Denies  Review of Systems  Constitutional: Negative for fatigue.  HENT: Negative for mouth sores, mouth dryness and nose dryness.   Eyes: Negative for pain, itching and dryness.  Respiratory: Negative for shortness of breath and difficulty breathing.   Cardiovascular: Negative for chest pain and palpitations.  Gastrointestinal: Positive for constipation. Negative for blood in stool and diarrhea.  Endocrine: Negative for increased urination.  Genitourinary: Negative for difficulty urinating.  Musculoskeletal: Positive for arthralgias and joint pain. Negative for joint swelling, myalgias, morning stiffness, muscle tenderness and myalgias.  Skin: Negative for color change, rash and  redness.  Allergic/Immunologic: Negative for susceptible to infections.  Neurological: Negative for dizziness, numbness, headaches, memory loss and weakness.  Hematological: Negative for bruising/bleeding tendency.  Psychiatric/Behavioral: Negative for confusion.    PMFS History:  Patient Active Problem List   Diagnosis Date Noted  . Osteopenia of multiple sites 10/04/2019  . 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 Osteoarthritis of knee 01/18/2016  . Hypertension 01/18/2016  . Anxiety 01/18/2016  . Elevated cholesterol 01/18/2016  . CKD (chronic kidney disease) 01/18/2016  . Psoriatic arthritis ([place]) 10/27/2015  . Other psoriasis 10/27/2015    Past Medical History:  Diagnosis Date  . Anxiety 01/18/2016  . Arthritis    psoriatic arthritis   . CKD (chronic kidney disease) 01/18/2016  . Elevated cholesterol 01/18/2016  . Hypertension 01/18/2016  . Osteoarthritis of knee 01/18/2016  . Osteopenia    per patient, PCP ordered DEXA   . Psoriasis     Family History  Problem Relation Age of Onset  . Aneurysm Mother   . Cancer Father        Lung Cancer  . Cancer Brother        Esopheageal Cancer   Past Surgical History:  Procedure Laterality Date  . BREAST EXCISIONAL BIOPSY Right 1986  . BREAST SURGERY Right 1985   Benign Tumor removed   . LEG SURGERY Left 2004   Fibula and Tibia Rod placement   . TUBAL LIGATION  1993   Social History   Social History Narrative  . Not on file   Immunization History  Administered Date(s) Administered  .  Influenza, High Dose Seasonal PF 01/04/2018, 11/18/2018  . PFIZER([place])SARS-COV-2 Vaccination 04/05/2019, 04/26/2019, 12/13/2019  . Zoster Recombinat (Shingrix) 06/24/2017, 08/26/2017     Objective: Vital Signs: BP (!) 147/83 (BP Location: Left Arm, Patient Position: Sitting, Cuff Size: Normal)   Pulse 64   Resp 15   Ht 5\' 2"  (1.575 m)   Wt 176 lb (79.8 kg)   BMI 32.19 kg/m    Physical Exam Vitals and nursing note reviewed.   Constitutional:      Appearance: She is well-developed.  HENT:     Head: Normocephalic and atraumatic.  Eyes:     Conjunctiva/sclera: Conjunctivae normal.  Cardiovascular:     Rate and Rhythm: Normal rate and regular rhythm.     Heart sounds: Normal heart sounds.  Pulmonary:     Effort: Pulmonary effort is normal.     Breath sounds: Normal breath sounds.  Abdominal:     General: Bowel sounds are normal.     Palpations: Abdomen is soft.  Musculoskeletal:     Cervical back: Normal range of motion.  Lymphadenopathy:     Cervical: No cervical adenopathy.  Skin:    General: Skin is warm and dry.     Capillary Refill: Capillary refill takes less than 2 seconds.  Neurological:     Mental Status: She is alert and oriented to person, place, and time.  Psychiatric:        Behavior: Behavior normal.      Musculoskeletal Exam: C-spine was in good range of motion.  Shoulder joints, elbow joints, wrist joints, MCPs PIPs and DIPs with good range of motion.  She has CMC, PIP and DIP thickening with no synovitis.  Hip joints, knee joints, ankles, MTPs and PIPs in good range of motion with no synovitis.  CDAI Exam: CDAI Score: -- Patient Global: --; Provider Global: -- Swollen: --; Tender: -- Joint Exam 08/14/2020   No joint exam has been documented for this visit   There is currently no information documented on the homunculus. Go to the Rheumatology activity and complete the homunculus joint exam.  Investigation: No additional findings.  Imaging: No results found.  Recent Labs: Lab Results  Component Value Date   WBC 6.8 06/28/2020   HGB 13.2 06/28/2020   PLT 249 06/28/2020   NA 138 06/28/2020   K 4.6 06/28/2020   CL 104 06/28/2020   CO2 27 06/28/2020   GLUCOSE 140 (H) 06/28/2020   BUN 19 06/28/2020   CREATININE 1.09 (H) 06/28/2020   BILITOT 0.4 06/28/2020   ALKPHOS 72 10/12/2018   AST 16 06/28/2020   ALT 15 06/28/2020   PROT 7.0 06/28/2020   ALBUMIN 3.6 10/12/2018    CALCIUM 9.3 06/28/2020   GFRAA 59 (L) 06/28/2020   QFTBGOLD Negative 07/22/2016   QFTBGOLDPLUS NEGATIVE 03/14/2020    Speciality Comments: Inadequate response to Stelara, and Enbrel, methotrexate caused elevated creatinine  Procedures:  No procedures performed Allergies: Patient has no known allergies.   Assessment / Plan:     Visit Diagnoses: Psoriatic arthritis ([place]) -she is doing well on Cosentyx.  She has been tolerating the medication well.  She had no synovitis on my examination today.  Prior therapy includes: Stelara (improvement in psoriasis but not joints), Enbrel (improvement in joints but not psoriasis) . Methotrexate-elevated creatinine.   Other psoriasis-she has no active psoriasis lesions.  High risk medication use - Cosentyx 150 mg sq q 2 weeks-started on 01/05/19.  D/c Remicade-last infusion 11/23/18 due to low grade fevers.  Labs from June 28, 2020 were stable except low GFR.  TB gold was negative on March 14, 2020.  She is fully vaccinated against COVID-19.  A fourth dose (booster) was also discussed.  Guidelines regarding the vaccination was placed in the AVS.  She is advised to stop Cosentyx in case she develops an infection.  She may resume Cosentyx once the infection resolves.  Sacroiliitis (HCC)-she does not have any SI joint discomfort.  Chronic pain of both knees -she has been having increasing pain and discomfort in her bilateral knee joints.  No warmth swelling or effusion was noted. Weight loss diet and exercise was emphasized.  She has gained some weight and will continue to work on it.  I discussed the option of physical supplement injections.  Plan: XR KNEE 3 VIEW RIGHT, XR KNEE 3 VIEW LEFT.  X-rays obtained today showed bilateral moderate osteoarthritis and moderate chondromalacia patella.  She had an adequate response to cortisone injection in the past.  We discussed possible use of Visco supplement injections.  She was in agreement.  We will apply for Visco  supplement injections.  This patient is diagnosed with osteoarthritis of the knee(s).    Radiographs show evidence of joint space narrowing, osteophytes, subchondral sclerosis and/or subchondral cysts.  This patient has knee pain which interferes with functional and activities of daily living.    This patient has experienced inadequate response, adverse effects and/or intolerance with conservative treatments such as acetaminophen, NSAIDS, topical creams, physical therapy or regular exercise, knee bracing and/or weight loss.   This patient has experienced inadequate response or has a contraindication to intra articular steroid injections for at least 3 months.   This patient is not scheduled to have a total knee replacement within 6 months of starting treatment with viscosupplementation.  Primary osteoarthritis of both knees-she has osteoarthritis involving both knees.  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  History of hypercholesterolemia-increased risk of heart disease with psoriatic arthritis was discussed.  Dietary modifications and exercise was emphasized.  History of hypertension-blood pressure is elevated today.  She has been advised to monitor blood pressure closely.  Vitamin D deficiency-last vitamin D level on March 14, 2020 was normal.  History of chronic kidney disease-her GFR is in the 21s.  History of anxiety  Osteopenia of multiple sites - The BMD measured at Femur Neck Left is 0.809 g/cm2 with a T-score of-1.6. 07/29/19.  Orders: Orders Placed This Encounter  Procedures  . XR KNEE 3 VIEW RIGHT  . XR KNEE 3 VIEW LEFT   No orders of the defined types were placed in this encounter.    Follow-Up Instructions: Return in about 5 months (around 01/14/2021) for Psoriatic arthritis.   Bo Merino, MD  Note - This record has been created using Editor, commissioning.  Chart creation errors have been sought, but may not always  have been located. Such  creation errors do not reflect on  the standard of medical care.

## 2020-08-14 ENCOUNTER — Other Ambulatory Visit: Payer: Self-pay

## 2020-08-14 ENCOUNTER — Ambulatory Visit: Payer: Self-pay

## 2020-08-14 ENCOUNTER — Encounter: Payer: Self-pay | Admitting: Rheumatology

## 2020-08-14 ENCOUNTER — Telehealth: Payer: Self-pay | Admitting: *Deleted

## 2020-08-14 ENCOUNTER — Ambulatory Visit: Payer: Federal, State, Local not specified - PPO | Admitting: Rheumatology

## 2020-08-14 VITALS — BP 147/83 | HR 64 | Resp 15 | Ht 62.0 in | Wt 176.0 lb

## 2020-08-14 DIAGNOSIS — L4050 Arthropathic psoriasis, unspecified: Secondary | ICD-10-CM

## 2020-08-14 DIAGNOSIS — E559 Vitamin D deficiency, unspecified: Secondary | ICD-10-CM

## 2020-08-14 DIAGNOSIS — M461 Sacroiliitis, not elsewhere classified: Secondary | ICD-10-CM | POA: Diagnosis not present

## 2020-08-14 DIAGNOSIS — L408 Other psoriasis: Secondary | ICD-10-CM | POA: Diagnosis not present

## 2020-08-14 DIAGNOSIS — Z8659 Personal history of other mental and behavioral disorders: Secondary | ICD-10-CM

## 2020-08-14 DIAGNOSIS — M25562 Pain in left knee: Secondary | ICD-10-CM

## 2020-08-14 DIAGNOSIS — Z87448 Personal history of other diseases of urinary system: Secondary | ICD-10-CM

## 2020-08-14 DIAGNOSIS — Z79899 Other long term (current) drug therapy: Secondary | ICD-10-CM | POA: Diagnosis not present

## 2020-08-14 DIAGNOSIS — Z8639 Personal history of other endocrine, nutritional and metabolic disease: Secondary | ICD-10-CM

## 2020-08-14 DIAGNOSIS — M25561 Pain in right knee: Secondary | ICD-10-CM

## 2020-08-14 DIAGNOSIS — Z8679 Personal history of other diseases of the circulatory system: Secondary | ICD-10-CM

## 2020-08-14 DIAGNOSIS — M8589 Other specified disorders of bone density and structure, multiple sites: Secondary | ICD-10-CM

## 2020-08-14 DIAGNOSIS — L920 Granuloma annulare: Secondary | ICD-10-CM

## 2020-08-14 DIAGNOSIS — G8929 Other chronic pain: Secondary | ICD-10-CM | POA: Diagnosis not present

## 2020-08-14 DIAGNOSIS — M170 Bilateral primary osteoarthritis of knee: Secondary | ICD-10-CM

## 2020-08-14 NOTE — Telephone Encounter (Signed)
Please apply for Bilateral knee visco. Thank you.

## 2020-08-14 NOTE — Patient Instructions (Addendum)
Standing Labs We placed an order today for your standing lab work.   Please have your standing labs drawn in July and every 3 months  If possible, please have your labs drawn 2 weeks prior to your appointment so that the provider can discuss your results at your appointment.  Please note that you may see your imaging and lab results in [place] before we have reviewed them. We may be awaiting multiple results to interpret others before contacting you. Please allow our office up to 72 hours to thoroughly review all of the results before contacting the office for clarification of your results.  We have open lab daily: Monday through Thursday from 1:30-4:30 PM and Friday from 1:30-4:00 PM at the office of Dr. Bo Merino, [facility].   Please be advised, all patients with office appointments requiring lab work will take precedent over walk-in lab work.  If possible, please come for your lab work on Monday and Friday afternoons, as you may experience shorter wait times. The office is located at [address] [place], [place], [place] [address] No appointment is necessary.   Labs are drawn by [facility]. Please bring your co-pay at the time of your lab draw.  You may receive a bill from [facility] for your lab work.  If you wish to have your labs drawn at another location, please call the office 24 hours in advance to send orders.  If you have any questions regarding directions or hours of operation,  please call [phone].   As a reminder, please drink plenty of water prior to coming for your lab work. Thanks!   Vaccines You are taking a medication(s) that can suppress your immune system.  The following immunizations are recommended: . Flu annually . Covid-19  . Td/Tdap (tetanus, diphtheria, pertussis) every 10 years . Pneumonia (Prevnar 15 then Pneumovax 23 at least 1 year apart.  Alternatively, can take Prevnar 20 without needing additional dose) . Shingrix (after age  62): 2 doses from 4 weeks to 6 months apart  Please check with your PCP to make sure you are up to date.   If you test POSITIVE for COVID19 and have MILD to MODERATE symptoms: o First, call your PCP if you would like to receive COVID19 treatment AND o Hold your medications during the infection and for at least 1 week after your symptoms have resolved: - Injectable medication (Benlysta, Cimzia, Cosentyx, Enbrel, Humira, Orencia, Remicade, Simponi, Stelara, Taltz, Tremfya) - Methotrexate - Leflunomide (Arava) - Mycophenolate (Cellcept) - Morrie Sheldon, Olumiant, or Rinvoq o If you take Actemra or Kevzara, you DO NOT need to hold these for COVID19 infection.  If you test POSITIVE for COVID19 and have NO symptoms: o First, call your PCP if you would like to receive COVID19 treatment AND o Hold your medications for at least 10 days after the day that you tested positive - Injectable medication (Benlysta, Cimzia, Cosentyx, Enbrel, Humira, Orencia, Remicade, Simponi, Stelara, Taltz, Tremfya) - Methotrexate - Leflunomide (Arava) - Mycophenolate (Cellcept) - Morrie Sheldon, Olumiant, or Rinvoq o If you take Actemra or Kevzara, you DO NOT need to hold these for COVID19 infection.  If you have signs or symptoms of an infection or start antibiotics: . First, call your PCP for workup of your infection. . Hold your medication through the infection, until you complete your antibiotics, and until symptoms resolve if you take the following: o Injectable medication (Actemra, Benlysta, Cimzia, Cosentyx, Enbrel, Humira, Kevzara, Orencia, Remicade, Simponi, Stelara, Taltz, Tremfya) o Methotrexate  o Leflunomide (Arava) o Mycophenolate (Cellcept) o Morrie Sheldon, Olumiant, or Rinvoq  Heart Disease Prevention   Your inflammatory disease increases your risk of heart disease which includes heart attack, stroke, atrial fibrillation (irregular heartbeats), high blood pressure, heart failure and atherosclerosis (plaque in the  arteries).  It is important to reduce your risk by:   . Keep blood pressure, cholesterol, and blood sugar at healthy levels   . Smoking Cessation   . Maintain a healthy weight  o BMI 20-25   . Eat a healthy diet  o Plenty of fresh fruit, vegetables, and whole grains  o Limit saturated fats, foods high in sodium, and added sugars  o DASH and Mediterranean diet   . Increase physical activity  o Recommend moderate physically activity for 150 minutes per week/ 30 minutes a day for five days a week These can be broken up into three separate ten-minute sessions during the day.   . Reduce Stress  . Meditation, slow breathing exercises, yoga, coloring books  . Dental visits twice a year     Journal for Nurse Practitioners, 15(4), [phone]. Retrieved December 14, 2017 from http://clinicalkey.com/nursing">  Knee Exercises Ask your health care provider which exercises are safe for you. Do exercises exactly as told by your health care provider and adjust them as directed. It is normal to feel mild stretching, pulling, tightness, or discomfort as you do these exercises. Stop right away if you feel sudden pain or your pain gets worse. Do not begin these exercises until told by your health care provider. Stretching and range-of-motion exercises These exercises warm up your muscles and joints and improve the movement and flexibility of your knee. These exercises also help to relieve pain and swelling. Knee extension, prone 1. Lie on your abdomen (prone position) on a bed. 2. Place your left / right knee just beyond the edge of the surface so your knee is not on the bed. You can put a towel under your left / right thigh just above your kneecap for comfort. 3. Relax your leg muscles and allow gravity to straighten your knee (extension). You should feel a stretch behind your left / right knee. 4. Hold this position for __________ seconds. 5. Scoot up so your knee is supported between repetitions. Repeat  __________ times. Complete this exercise __________ times a day. Knee flexion, active 1. Lie on your back with both legs straight. If this causes back discomfort, bend your left / right knee so your foot is flat on the floor. 2. Slowly slide your left / right heel back toward your buttocks. Stop when you feel a gentle stretch in the front of your knee or thigh (flexion). 3. Hold this position for __________ seconds. 4. Slowly slide your left / right heel back to the starting position. Repeat __________ times. Complete this exercise __________ times a day.   Quadriceps stretch, prone 1. Lie on your abdomen on a firm surface, such as a bed or padded floor. 2. Bend your left / right knee and hold your ankle. If you cannot reach your ankle or pant leg, loop a belt around your foot and grab the belt instead. 3. Gently pull your heel toward your buttocks. Your knee should not slide out to the side. You should feel a stretch in the front of your thigh and knee (quadriceps). 4. Hold this position for __________ seconds. Repeat __________ times. Complete this exercise __________ times a day.   Hamstring, supine 1. Lie on your back (supine position).  2. Loop a belt or towel over the [place] of your left / right foot. The [place] of your foot is on the walking surface, right under your toes. 3. Straighten your left / right knee and slowly pull on the belt to raise your leg until you feel a gentle stretch behind your knee (hamstring). ? Do not let your knee bend while you do this. ? Keep your other leg flat on the floor. 4. Hold this position for __________ seconds. Repeat __________ times. Complete this exercise __________ times a day. Strengthening exercises These exercises build strength and endurance in your knee. Endurance is the ability to use your muscles for a long time, even after they get tired. Quadriceps, isometric This exercise stretches the muscles in front of your thigh (quadriceps) without  moving your knee joint (isometric). 1. Lie on your back with your left / right leg extended and your other knee bent. Put a rolled towel or small pillow under your knee if told by your health care provider. 2. Slowly tense the muscles in the front of your left / right thigh. You should see your kneecap slide up toward your hip or see increased dimpling just above the knee. This motion will push the back of the knee toward the floor. 3. For __________ seconds, hold the muscle as tight as you can without increasing your pain. 4. Relax the muscles slowly and completely. Repeat __________ times. Complete this exercise __________ times a day.   Straight leg raises This exercise stretches the muscles in front of your thigh (quadriceps) and the muscles that move your hips (hip flexors). 1. Lie on your back with your left / right leg extended and your other knee bent. 2. Tense the muscles in the front of your left / right thigh. You should see your kneecap slide up or see increased dimpling just above the knee. Your thigh may even shake a bit. 3. Keep these muscles tight as you raise your leg 4-6 inches (10-15 cm) off the floor. Do not let your knee bend. 4. Hold this position for __________ seconds. 5. Keep these muscles tense as you lower your leg. 6. Relax your muscles slowly and completely after each repetition. Repeat __________ times. Complete this exercise __________ times a day. Hamstring, isometric 1. Lie on your back on a firm surface. 2. Bend your left / right knee about __________ degrees. 3. Dig your left / right heel into the surface as if you are trying to pull it toward your buttocks. Tighten the muscles in the back of your thighs (hamstring) to "dig" as hard as you can without increasing any pain. 4. Hold this position for __________ seconds. 5. Release the tension gradually and allow your muscles to relax completely for __________ seconds after each repetition. Repeat __________ times.  Complete this exercise __________ times a day. Hamstring curls If told by your health care provider, do this exercise while wearing ankle weights. Begin with __________ lb weights. Then increase the weight by 1 lb (0.5 kg) increments. Do not wear ankle weights that are more than __________ lb. 1. Lie on your abdomen with your legs straight. 2. Bend your left / right knee as far as you can without feeling pain. Keep your hips flat against the floor. 3. Hold this position for __________ seconds. 4. Slowly lower your leg to the starting position. Repeat __________ times. Complete this exercise __________ times a day.   Squats This exercise strengthens the muscles in front of your thigh  and knee (quadriceps). 1. Stand in front of a table, with your feet and knees pointing straight ahead. You may rest your hands on the table for balance but not for support. 2. Slowly bend your knees and lower your hips like you are going to sit in a chair. ? Keep your weight over your heels, not over your toes. ? Keep your lower legs upright so they are parallel with the table legs. ? Do not let your hips go lower than your knees. ? Do not bend lower than told by your health care provider. ? If your knee pain increases, do not bend as low. 3. Hold the squat position for __________ seconds. 4. Slowly push with your legs to return to standing. Do not use your hands to pull yourself to standing. Repeat __________ times. Complete this exercise __________ times a day. Wall slides This exercise strengthens the muscles in front of your thigh and knee (quadriceps). 1. Lean your back against a smooth wall or door, and walk your feet out 18-24 inches (46-61 cm) from it. 2. Place your feet hip-width apart. 3. Slowly slide down the wall or door until your knees bend __________ degrees. Keep your knees over your heels, not over your toes. Keep your knees in line with your hips. 4. Hold this position for __________  seconds. Repeat __________ times. Complete this exercise __________ times a day.   Straight leg raises This exercise strengthens the muscles that rotate the leg at the hip and move it away from your body (hip abductors). 1. Lie on your side with your left / right leg in the top position. Lie so your head, shoulder, knee, and hip line up. You may bend your bottom knee to help you keep your balance. 2. Roll your hips slightly forward so your hips are stacked directly over each other and your left / right knee is facing forward. 3. Leading with your heel, lift your top leg 4-6 inches (10-15 cm). You should feel the muscles in your outer hip lifting. ? Do not let your foot drift forward. ? Do not let your knee roll toward the ceiling. 4. Hold this position for __________ seconds. 5. Slowly return your leg to the starting position. 6. Let your muscles relax completely after each repetition. Repeat __________ times. Complete this exercise __________ times a day.   Straight leg raises This exercise stretches the muscles that move your hips away from the front of the pelvis (hip extensors). 1. Lie on your abdomen on a firm surface. You can put a pillow under your hips if that is more comfortable. 2. Tense the muscles in your buttocks and lift your left / right leg about 4-6 inches (10-15 cm). Keep your knee straight as you lift your leg. 3. Hold this position for __________ seconds. 4. Slowly lower your leg to the starting position. 5. Let your leg relax completely after each repetition. Repeat __________ times. Complete this exercise __________ times a day. This information is not intended to replace advice given to you by your health care provider. Make sure you discuss any questions you have with your health care provider. Document Revised: 12/15/2017 Document Reviewed: 12/15/2017 Elsevier Patient Education  2021 [employer].

## 2020-08-16 NOTE — Telephone Encounter (Signed)
Submitted for VOB. 08/16/2020

## 2020-08-20 NOTE — Telephone Encounter (Signed)
Please call to schedule for Visco knee injections.   Authorized for Synvisc series, Bilateral knees. Buy and Bill.  Deductible does not apply. No PA required.  Insurance to cover 70 % of allowable cost, with a $150.00 copay.

## 2020-08-24 NOTE — Telephone Encounter (Signed)
I LMOM for patient to call for benefit information, and to schedule Visco knee injections.

## 2020-10-11 ENCOUNTER — Telehealth: Payer: Self-pay

## 2020-10-11 DIAGNOSIS — Z79899 Other long term (current) drug therapy: Secondary | ICD-10-CM

## 2020-10-11 DIAGNOSIS — Z9225 Personal history of immunosupression therapy: Secondary | ICD-10-CM

## 2020-10-11 DIAGNOSIS — Z111 Encounter for screening for respiratory tuberculosis: Secondary | ICD-10-CM

## 2020-10-11 NOTE — Telephone Encounter (Signed)
Lab Orders released.  

## 2020-10-11 NOTE — Telephone Encounter (Signed)
Patient called requesting her labwork orders be sent to [facility] in [place].  Patient has appointment tomorrow, 10/12/20 at 2:00 pm.

## 2020-10-13 LAB — CBC WITH DIFFERENTIAL/PLATELET
Absolute Monocytes: 524 cells/uL (ref 200–950)
Basophils Absolute: 48 cells/uL (ref 0–200)
Basophils Relative: 0.7 %
Eosinophils Absolute: 190 cells/uL (ref 15–500)
Eosinophils Relative: 2.8 %
HCT: 38.9 % (ref 35.0–45.0)
Hemoglobin: 12.8 g/dL (ref 11.7–15.5)
Lymphs Abs: 2020 cells/uL (ref 850–3900)
MCH: 28.9 pg (ref 27.0–33.0)
MCHC: 32.9 g/dL (ref 32.0–36.0)
MCV: 87.8 fL (ref 80.0–100.0)
MPV: 11.1 fL (ref 7.5–12.5)
Monocytes Relative: 7.7 %
Neutro Abs: 4019 cells/uL (ref 1500–7800)
Neutrophils Relative %: 59.1 %
Platelets: 234 10*3/uL (ref 140–400)
RBC: 4.43 10*6/uL (ref 3.80–5.10)
RDW: 12.4 % (ref 11.0–15.0)
Total Lymphocyte: 29.7 %
WBC: 6.8 10*3/uL (ref 3.8–10.8)

## 2020-10-13 LAB — COMPLETE METABOLIC PANEL WITH GFR
AG Ratio: 1.3 (calc) (ref 1.0–2.5)
ALT: 14 U/L (ref 6–29)
AST: 20 U/L (ref 10–35)
Albumin: 4 g/dL (ref 3.6–5.1)
Alkaline phosphatase (APISO): 67 U/L (ref 37–153)
BUN/Creatinine Ratio: 12 (calc) (ref 6–22)
BUN: 12 mg/dL (ref 7–25)
CO2: 25 mmol/L (ref 20–32)
Calcium: 8.9 mg/dL (ref 8.6–10.4)
Chloride: 106 mmol/L (ref 98–110)
Creat: 1.04 mg/dL — ABNORMAL HIGH (ref 0.60–1.00)
Globulin: 3 g/dL (calc) (ref 1.9–3.7)
Glucose, Bld: 162 mg/dL — ABNORMAL HIGH (ref 65–139)
Potassium: 4.2 mmol/L (ref 3.5–5.3)
Sodium: 140 mmol/L (ref 135–146)
Total Bilirubin: 0.4 mg/dL (ref 0.2–1.2)
Total Protein: 7 g/dL (ref 6.1–8.1)
eGFR: 57 mL/min/{1.73_m2} — ABNORMAL LOW (ref >=60)

## 2020-10-13 NOTE — Progress Notes (Signed)
Glucose is elevated, creatinine is mildly elevated and stable.  CBC is normal.

## 2020-11-07 ENCOUNTER — Other Ambulatory Visit: Payer: Self-pay | Admitting: Physician Assistant

## 2020-11-07 NOTE — Telephone Encounter (Signed)
Next Visit: 01/14/2021  Last Visit: 08/14/2020  Last Fill: 05/24/2020  [id number] arthritis   Current Dose per office note 08/14/2020: Cosentyx 150 mg sq q 2 weeks-  Labs: 10/12/2020 Glucose is elevated, creatinine is mildly elevated and stable.  CBC is normal.  TB Gold:  03/14/2020 Neg   Okay to refill Cosentyx?

## 2020-12-31 ENCOUNTER — Telehealth: Payer: Self-pay | Admitting: Pharmacist

## 2020-12-31 NOTE — Telephone Encounter (Signed)
Submitted a Prior Authorization renewal request to [employer] for [employer] via fax. Will update once we receive a response.  Fax: [phone] Phone: [phone]  Knox Saliva, PharmD, MPH, BCPS Clinical Pharmacist (Rheumatology and Pulmonology)

## 2020-12-31 NOTE — Progress Notes (Signed)
Office Visit Note  Patient: Morgan Jensen             Date of Birth: 28-Sep-1950           MRN: [id number]             PCP: Maurice Small, MD (Inactive) Referring: Maurice Small, MD Visit Date: 01/14/2021 Occupation: @GUAROCC @  Subjective:  Pain on the lateral aspect of the left knee  History of Present Illness: Morgan Jensen is a 70 y.o. female with history of psoriatic arthritis and osteoarthritis. She is on cosentyx 150 mg sq injections every 14 days.  She has not missed any doses of Cosentyx recently.  She denies any recent psoriatic arthritis flares.  She denies any joint swelling currently.  She denies any SI joint discomfort.  She is not having some discomfort on the lateral aspect of her left knee.  She has not been evaluated by Dr. Stann Mainland recently.  She denies any inflammation or mechanical symptoms in the left knee at this time.  She states her discomfort is most severe when flexing her left knee.  She uses Voltaren gel topically as needed for pain relief.  She has occasional discomfort in the right Achilles tendon but denies any tenderness or inflammation at this time.  She denies any plantar fasciitis.  She denies any active psoriasis. She denies any recent infections.  She has received the annual influenza vaccination as well as 1 COVID-19 vaccine booster.   Activities of Daily Living:  Patient reports morning stiffness for 0 minutes.   Patient Denies nocturnal pain.  Difficulty dressing/grooming: Denies Difficulty climbing stairs: Denies Difficulty getting out of chair: Denies Difficulty using hands for taps, buttons, cutlery, and/or writing: Denies  Review of Systems  Constitutional:  Negative for fatigue.  HENT:  Negative for mouth sores, mouth dryness and nose dryness.   Eyes:  Positive for dryness. Negative for pain and itching.  Respiratory:  Negative for shortness of breath and difficulty breathing.   Cardiovascular:  Negative for chest pain and palpitations.   Gastrointestinal:  Negative for blood in stool, constipation and diarrhea.  Endocrine: Negative for increased urination.  Genitourinary:  Negative for difficulty urinating.  Musculoskeletal:  Positive for muscle tenderness. Negative for joint pain, joint pain, joint swelling, myalgias, morning stiffness and myalgias.  Skin:  Negative for color change, rash and redness.  Allergic/Immunologic: Negative for susceptible to infections.  Neurological:  Negative for dizziness, numbness, headaches, memory loss and weakness.  Hematological:  Negative for bruising/bleeding tendency.  Psychiatric/Behavioral:  Negative for confusion.    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   Osteoarthritis of knee 01/18/2016   Hypertension 01/18/2016   Anxiety 01/18/2016   Elevated cholesterol 01/18/2016   CKD (chronic kidney disease) 01/18/2016   Psoriatic arthritis ([place]) 10/27/2015   Other psoriasis 10/27/2015    Past Medical History:  Diagnosis Date   Anxiety 01/18/2016   Arthritis    psoriatic arthritis    CKD (chronic kidney disease) 01/18/2016   Elevated cholesterol 01/18/2016   Hypertension 01/18/2016   Osteoarthritis of knee 01/18/2016   Osteopenia    per patient, PCP ordered DEXA    Psoriasis     Family History  Problem Relation Age of Onset   Aneurysm Mother    Cancer Father        Lung Cancer   Cancer Brother  Esopheageal Cancer   Past Surgical History:  Procedure Laterality Date   BREAST EXCISIONAL BIOPSY Right 1986   BREAST SURGERY Right 1985   Benign Tumor removed    LEG SURGERY Left 2004   Fibula and Tibia Rod placement    TUBAL LIGATION  1993   Social History   Social History Narrative   Not on file   Immunization History  Administered Date(s) Administered   Influenza, High Dose Seasonal PF 01/04/2018, 11/18/2018   PFIZER(Purple Top)SARS-COV-2 Vaccination  04/05/2019, 04/26/2019, 12/13/2019   Zoster Recombinat (Shingrix) 06/24/2017, 08/26/2017     Objective: Vital Signs: BP 132/83 (BP Location: Left Arm, Patient Position: Sitting, Cuff Size: Normal)   Pulse 80   Ht 5\' 2"  (1.575 m)   Wt 172 lb 9.6 oz (78.3 kg)   BMI 31.57 kg/m    Physical Exam Vitals and nursing note reviewed.  Constitutional:      Appearance: She is well-developed.  HENT:     Head: Normocephalic and atraumatic.  Eyes:     Conjunctiva/sclera: Conjunctivae normal.  Pulmonary:     Effort: Pulmonary effort is normal.     Breath sounds: Normal breath sounds.  Abdominal:     Palpations: Abdomen is soft.  Musculoskeletal:     Cervical back: Normal range of motion.  Skin:    General: Skin is warm and dry.     Capillary Refill: Capillary refill takes less than 2 seconds.  Neurological:     Mental Status: She is alert and oriented to person, place, and time.  Psychiatric:        Behavior: Behavior normal.     Musculoskeletal Exam: C-spine, thoracic spine, and lumbar spine good ROM.  Shoulder joints, elbow joints, wrist joints, MCPs, PIPs, and DIPs good ROM with no synovitis.  Complete fist formation bilaterally. Hip joints, knee joints, and ankle joints have good ROM.  No tenderness over trochanteric bursa.  Tenderness over the distal insertion site of the left IT band.  No warmth or effusion of knee joints.  No tenderness or swelling of ankle joints.  No evidence of achilles tendonitis or plantar fasciitis.  No tenderness over MTP joints.   CDAI Exam: CDAI Score: -- Patient Global: --; Provider Global: -- Swollen: --; Tender: -- Joint Exam 01/14/2021   No joint exam has been documented for this visit   There is currently no information documented on the homunculus. Go to the Rheumatology activity and complete the homunculus joint exam.  Investigation: No additional findings.  Imaging: No results found.  Recent Labs: Lab Results  Component Value Date    WBC 6.8 10/12/2020   HGB 12.8 10/12/2020   PLT 234 10/12/2020   NA 140 10/12/2020   K 4.2 10/12/2020   CL 106 10/12/2020   CO2 25 10/12/2020   GLUCOSE 162 (H) 10/12/2020   BUN 12 10/12/2020   CREATININE 1.04 (H) 10/12/2020   BILITOT 0.4 10/12/2020   ALKPHOS 72 10/12/2018   AST 20 10/12/2020   ALT 14 10/12/2020   PROT 7.0 10/12/2020   ALBUMIN 3.6 10/12/2018   CALCIUM 8.9 10/12/2020   GFRAA 59 (L) 06/28/2020   QFTBGOLD Negative 07/22/2016   QFTBGOLDPLUS NEGATIVE 03/14/2020    Speciality Comments: Inadequate response to Stelara, and Enbrel, methotrexate caused elevated creatinine  Procedures:  No procedures performed Allergies: Patient has no known allergies.    Assessment / Plan:     Visit Diagnoses: Psoriatic arthritis ([place]): She has no synovitis or dactylitis on examination today.  She  has not had any signs or symptoms of a psoriatic arthritis flare recently.  She is clinically doing well on Cosentyx 150 mg sq injections every 14 days.  She has not missed any doses of Cosentyx recently and continues to tolerate without any side effects or injection site reactions.  She has no evidence of Achilles tendinitis or plantar fasciitis.  She has no SI joint tenderness or stiffness at this time.  She has not been experiencing any morning stiffness or nocturnal pain.  She has no active psoriasis currently.  She will remain on the current treatment regimen.  She was advised to notify us if she develops increased joint pain or joint swelling.  She will follow-up in the office in 5 months.  Other psoriasis: She has no active psoriasis at this time.   High risk medication use - Cosentyx 150 mg sq q 2 weeks-started on 01/05/19.  D/c Remicade-last infusion 11/23/18 due to low grade fevers. TB gold negative on 03/14/20.  CBC and CMP updated on 10/12/20.  She is due to update lab work today.  Orders for CBC and CMP were released. Her next lab work will be due in February and every 3 months.  Standing  orders for CBC and CMP are in place. - Plan: CBC with Differential/Platelet, COMPLETE METABOLIC PANEL WITH GFR, QuantiFERON-TB Gold Plus She has not had any recent infections.  Discussed the importance of holding Cosentyx if she develops signs or symptoms of an infection and to resume once the infection is completely cleared.  Screening for tuberculosis - Future order for TB gold placed today. Plan: QuantiFERON-TB Gold Plus  Sacroiliitis [facility]): She is not experiencing any SI joint discomfort or stiffness at this time.  No nocturnal pain.  Primary osteoarthritis of both knees - X-rays obtained today showed bilateral moderate osteoarthritis and moderate chondromalacia patella.  She has good range of motion of both knee joints with no discomfort.  She has some tenderness on the lateral aspect of the left knee over the IT band.  She is not experiencing any mechanical symptoms at this time.  No warmth or effusion was noted.  She plans on using Voltaren gel topically as needed and was given a handout of IT band exercises to perform.  She was advised to notify us or follow-up with Dr. Stann Mainland if her discomfort persists or worsens.  Iliotibial band syndrome of left side-distal-She presents today with tenderness over the distal insertion site of the left IT band.  She has full flexion extension of the left knee with minimal discomfort.  She has not had any injury or fall prior to the onset of symptoms.  She is not experiencing mechanical symptoms.  No warmth or effusion of the knee joint was noted on examination today.  She is given a handout of IT band exercises to perform.  She was also encouraged to use Voltaren gel topically as needed for pain relief.  She was advised to notify us or follow-up with Dr. Stann Mainland for further evaluation if her discomfort persists or worsens.  Osteopenia of multiple sites: DEXA updated on 07/29/19: The BMD measured at Femur Neck Left is 0.809 g/cm2 with a T-score of -1.6.  No recent  falls or fractures. She is taking a calcium and vitamin D supplement daily. Due to update DEXA in May 2023.   Other medical conditions are listed as follows:   Granuloma annulare  History of hypercholesterolemia  History of hypertension: BP was 132/83 today in the office.  Vitamin D deficiency: She is taking a calcium and vitamin D supplement daily.   History of chronic kidney disease: CMP with GFR ordered today.   History of anxiety    Orders: Orders Placed This Encounter  Procedures   CBC with Differential/Platelet   COMPLETE METABOLIC PANEL WITH GFR   QuantiFERON-TB Gold Plus   No orders of the defined types were placed in this encounter.    Follow-Up Instructions: Return in about 5 months (around 06/14/2021) for Psoriatic arthritis, Osteoarthritis.   Ofilia Neas, PA-C  Note - This record has been created using Dragon software.  Chart creation errors have been sought, but may not always  have been located. Such creation errors do not reflect on  the standard of medical care.

## 2021-01-01 NOTE — Telephone Encounter (Signed)
Received notification from [facility] regarding a prior authorization for [place]. Authorization has been APPROVED from 12/01/20 to 06/29/2022.   Phone # [phone]  Knox Saliva, PharmD, MPH, BCPS Clinical Pharmacist (Rheumatology and Pulmonology)

## 2021-01-14 ENCOUNTER — Other Ambulatory Visit: Payer: Self-pay

## 2021-01-14 ENCOUNTER — Encounter: Payer: Self-pay | Admitting: Physician Assistant

## 2021-01-14 ENCOUNTER — Ambulatory Visit: Payer: Federal, State, Local not specified - PPO | Admitting: Physician Assistant

## 2021-01-14 VITALS — BP 132/83 | HR 80 | Ht 62.0 in | Wt 172.6 lb

## 2021-01-14 DIAGNOSIS — Z87448 Personal history of other diseases of urinary system: Secondary | ICD-10-CM

## 2021-01-14 DIAGNOSIS — L4050 Arthropathic psoriasis, unspecified: Secondary | ICD-10-CM | POA: Diagnosis not present

## 2021-01-14 DIAGNOSIS — M7632 Iliotibial band syndrome, left leg: Secondary | ICD-10-CM

## 2021-01-14 DIAGNOSIS — Z79899 Other long term (current) drug therapy: Secondary | ICD-10-CM

## 2021-01-14 DIAGNOSIS — Z8639 Personal history of other endocrine, nutritional and metabolic disease: Secondary | ICD-10-CM

## 2021-01-14 DIAGNOSIS — M170 Bilateral primary osteoarthritis of knee: Secondary | ICD-10-CM

## 2021-01-14 DIAGNOSIS — E559 Vitamin D deficiency, unspecified: Secondary | ICD-10-CM

## 2021-01-14 DIAGNOSIS — Z8659 Personal history of other mental and behavioral disorders: Secondary | ICD-10-CM

## 2021-01-14 DIAGNOSIS — Z8679 Personal history of other diseases of the circulatory system: Secondary | ICD-10-CM

## 2021-01-14 DIAGNOSIS — M461 Sacroiliitis, not elsewhere classified: Secondary | ICD-10-CM

## 2021-01-14 DIAGNOSIS — L408 Other psoriasis: Secondary | ICD-10-CM | POA: Diagnosis not present

## 2021-01-14 DIAGNOSIS — Z111 Encounter for screening for respiratory tuberculosis: Secondary | ICD-10-CM

## 2021-01-14 DIAGNOSIS — L920 Granuloma annulare: Secondary | ICD-10-CM

## 2021-01-14 DIAGNOSIS — M8589 Other specified disorders of bone density and structure, multiple sites: Secondary | ICD-10-CM

## 2021-01-14 NOTE — Patient Instructions (Signed)
Iliotibial Band Syndrome Rehab Ask your health care provider which exercises are safe for you. Do exercises exactly as told by your health care provider and adjust them as directed. It is normal to feel mild stretching, pulling, tightness, or discomfort as you do these exercises. Stop right away if you feel sudden pain or your pain gets significantly worse. Do not begin these exercises until told by your health care provider. Stretching and range-of-motion exercises These exercises warm up your muscles and joints and improve the movement andflexibility of your hip and pelvis. Quadriceps stretch, prone  Lie on your abdomen (prone position) on a firm surface, such as a bed or padded floor. Bend your left / right knee and reach back to hold your ankle or pant leg. If you cannot reach your ankle or pant leg, loop a belt around your foot and grab the belt instead. Gently pull your heel toward your buttocks. Your knee should not slide out to the side. You should feel a stretch in the front of your thigh and knee (quadriceps). Hold this position for __________ seconds. Repeat __________ times. Complete this exercise __________ times a day. Iliotibial band stretch An iliotibial band is a strong band of muscle tissue that runs from the outer side of your hip to the outer side of your thigh and knee. Lie on your side with your left / right leg in the top position. Bend both of your knees and grab your left / right ankle. Stretch out your bottom arm to help you balance. Slowly bring your top knee back so your thigh goes behind your trunk. Slowly lower your top leg toward the floor until you feel a gentle stretch on the outside of your left / right hip and thigh. If you do not feel a stretch and your knee will not fall farther, place the heel of your other foot on top of your knee and pull your knee down toward the floor with your foot. Hold this position for __________ seconds. Repeat __________ times.  Complete this exercise __________ times a day. Strengthening exercises These exercises build strength and endurance in your hip and pelvis. Enduranceis the ability to use your muscles for a long time, even after they get tired. Straight leg raises, side-lying This exercise strengthens the muscles that rotate the leg at the hip and move it away from your body (hip abductors). Lie on your side with your left / right leg in the top position. Lie so your head, shoulder, hip, and knee line up. You may bend your bottom knee to help you balance. Roll your hips slightly forward so your hips are stacked directly over each other and your left / right knee is facing forward. Tense the muscles in your outer thigh and lift your top leg 4-6 inches (10-15 cm). Hold this position for __________ seconds. Slowly lower your leg to return to the starting position. Let your muscles relax completely before doing another repetition. Repeat __________ times. Complete this exercise __________ times a day. Leg raises, prone This exercise strengthens the muscles that move the hips backward (hip extensors). Lie on your abdomen (prone position) on your bed or a firm surface. You can put a pillow under your hips if that is more comfortable for your lower back. Bend your left / right knee so your foot is straight up in the air. Squeeze your buttocks muscles and lift your left / right thigh off the bed. Do not let your back arch. Tense your thigh   muscle as hard as you can without increasing any knee pain. Hold this position for __________ seconds. Slowly lower your leg to return to the starting position and allow it to relax completely. Repeat __________ times. Complete this exercise __________ times a day. Hip hike Stand sideways on a bottom step. Stand on your left / right leg with your other foot unsupported next to the step. You can hold on to a railing or wall for balance if needed. Keep your knees straight and your  torso square. Then lift your left / right hip up toward the ceiling. Slowly let your left / right hip lower toward the floor, past the starting position. Your foot should get closer to the floor. Do not lean or bend your knees. Repeat __________ times. Complete this exercise __________ times a day. This information is not intended to replace advice given to you by your health care provider. Make sure you discuss any questions you have with your healthcare provider. Document Revised: 05/04/2019 Document Reviewed: 05/04/2019 Elsevier Patient Education  2022 [employer]  

## 2021-01-15 LAB — COMPLETE METABOLIC PANEL WITH GFR
AG Ratio: 1.3 (calc) (ref 1.0–2.5)
ALT: 18 U/L (ref 6–29)
AST: 22 U/L (ref 10–35)
Albumin: 4.5 g/dL (ref 3.6–5.1)
Alkaline phosphatase (APISO): 69 U/L (ref 37–153)
BUN/Creatinine Ratio: 19 (calc) (ref 6–22)
BUN: 20 mg/dL (ref 7–25)
CO2: 27 mmol/L (ref 20–32)
Calcium: 9.9 mg/dL (ref 8.6–10.4)
Chloride: 102 mmol/L (ref 98–110)
Creat: 1.08 mg/dL — ABNORMAL HIGH (ref 0.60–1.00)
Globulin: 3.4 g/dL (calc) (ref 1.9–3.7)
Glucose, Bld: 107 mg/dL — ABNORMAL HIGH (ref 65–99)
Potassium: 4.6 mmol/L (ref 3.5–5.3)
Sodium: 138 mmol/L (ref 135–146)
Total Bilirubin: 0.4 mg/dL (ref 0.2–1.2)
Total Protein: 7.9 g/dL (ref 6.1–8.1)
eGFR: 55 mL/min/{1.73_m2} — ABNORMAL LOW (ref >=60)

## 2021-01-15 LAB — CBC WITH DIFFERENTIAL/PLATELET
Absolute Monocytes: 696 cells/uL (ref 200–950)
Basophils Absolute: 59 cells/uL (ref 0–200)
Basophils Relative: 0.8 %
Eosinophils Absolute: 259 cells/uL (ref 15–500)
Eosinophils Relative: 3.5 %
HCT: 42.8 % (ref 35.0–45.0)
Hemoglobin: 14.6 g/dL (ref 11.7–15.5)
Lymphs Abs: 2368 cells/uL (ref 850–3900)
MCH: 29.9 pg (ref 27.0–33.0)
MCHC: 34.1 g/dL (ref 32.0–36.0)
MCV: 87.7 fL (ref 80.0–100.0)
MPV: 10.7 fL (ref 7.5–12.5)
Monocytes Relative: 9.4 %
Neutro Abs: 4018 cells/uL (ref 1500–7800)
Neutrophils Relative %: 54.3 %
Platelets: 277 10*3/uL (ref 140–400)
RBC: 4.88 10*6/uL (ref 3.80–5.10)
RDW: 12.3 % (ref 11.0–15.0)
Total Lymphocyte: 32 %
WBC: 7.4 10*3/uL (ref 3.8–10.8)

## 2021-01-15 NOTE — Progress Notes (Signed)
Glucose is 107.  Creatinine is slightly elevated-1.08 and GFR is slightly low-55.  Please advise the patient to avoid NSAID use.   CBC WNL.

## 2021-02-28 ENCOUNTER — Telehealth: Payer: Self-pay | Admitting: Rheumatology

## 2021-02-28 NOTE — Telephone Encounter (Signed)
Patient called the office stating she was told to let Dr. Estanislado Pandy know the results of her Covid test. Patient stated that she tested positive this morning.

## 2021-02-28 NOTE — Telephone Encounter (Signed)
Reviewed the following with patient    If you test POSITIVE for COVID19 and have MILD to MODERATE symptoms: First, call your PCP if you would like to receive COVID19 treatment AND Hold your medications during the infection and for at least 1 week after your symptoms have resolved: Injectable medication (Benlysta, Cimzia, Cosentyx, Enbrel, Humira, Orencia, Remicade, Simponi, Stelara, Taltz, Tremfya) Methotrexate Leflunomide (Arava) Azathioprine Mycophenolate (Cellcept) Roma Kayser, or Rinvoq Otezla If you take Actemra or Kevzara, you DO NOT need to hold these for COVID19 infection. She will hold her Cosentyx for at least one week after symptoms resolve.

## 2021-04-09 ENCOUNTER — Encounter: Payer: Self-pay | Admitting: Rheumatology

## 2021-04-09 NOTE — Telephone Encounter (Signed)
Please call the patient to schedule a sooner office visit for further evaluation.

## 2021-04-12 NOTE — Progress Notes (Signed)
Office Visit Note  Patient: Morgan Jensen             Date of Birth: 08/28/1950           MRN: [id number]             PCP: Maurice Small, MD Referring: Maurice Small, MD Visit Date: 04/15/2021 Occupation: @GUAROCC @  Subjective:  Lower back pain   History of Present Illness: HEIRESS Jensen is a 71 y.o. female with history of psoriatic arthritis. She is on cosentyx 150 mg sq injections every 2 weeks.  Patient reports that she held Cosentyx at the end of December when she was diagnosed with COVID-19.  She did not notice any increased joint pain or joint swelling while off of Cosentyx.  She denies any signs or symptoms of a psoriatic arthritis flare.  She has no active psoriasis at this time.  She denies any Achilles denies or plantar fasciitis.  She states that on 04/04/2021 she was performing housework including sweeping and mopping.  She states that she started to have increased lower back pain after performing these household activities.  She states that her pain persisted despite trying to rest as well as using a heating pad.  Her discomfort has been midline and has been worse when she tries standing up.  She states the pain has gradually started to improve the last few days.  She has not experienced any muscle spasms at this time.  She has no symptoms of radiculopathy.  She has not had any lower extremity muscle weakness.  She denies any SI joint discomfort at this time.  She denies any other joint pain or joint swelling at this time.     Activities of Daily Living:  Patient reports morning stiffness for 0 minute.   Patient Denies nocturnal pain.  Difficulty dressing/grooming: Denies Difficulty climbing stairs: Reports Difficulty getting out of chair: Reports Difficulty using hands for taps, buttons, cutlery, and/or writing: Denies  Review of Systems  Constitutional:  Negative for fatigue.  HENT:  Negative for mouth sores, mouth dryness and nose dryness.   Eyes:  Positive for dryness.  Negative for pain and itching.  Respiratory:  Negative for shortness of breath and difficulty breathing.   Cardiovascular:  Negative for chest pain and palpitations.  Gastrointestinal:  Negative for blood in stool, constipation and diarrhea.  Endocrine: Negative for increased urination.  Genitourinary:  Negative for difficulty urinating.  Musculoskeletal:  Positive for myalgias, muscle tenderness and myalgias. Negative for joint pain, joint pain, joint swelling and morning stiffness.  Skin:  Negative for color change, rash and redness.  Allergic/Immunologic: Negative for susceptible to infections.  Neurological:  Negative for dizziness, numbness, headaches, memory loss and weakness.  Hematological:  Negative for bruising/bleeding tendency.  Psychiatric/Behavioral:  Negative for confusion.    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   Osteoarthritis of knee 01/18/2016   Hypertension 01/18/2016   Anxiety 01/18/2016   Elevated cholesterol 01/18/2016   CKD (chronic kidney disease) 01/18/2016   Psoriatic arthritis ([place]) 10/27/2015   Other psoriasis 10/27/2015    Past Medical History:  Diagnosis Date   Anxiety 01/18/2016   Arthritis    psoriatic arthritis    CKD (chronic kidney disease) 01/18/2016   Elevated cholesterol 01/18/2016   Hypertension 01/18/2016   Osteoarthritis of knee 01/18/2016   Osteopenia    per patient, PCP ordered DEXA  Psoriasis     Family History  Problem Relation Age of Onset   Aneurysm Mother    Cancer Father        Lung Cancer   Cancer Brother        Esopheageal Cancer   Past Surgical History:  Procedure Laterality Date   BREAST EXCISIONAL BIOPSY Right 1986   BREAST SURGERY Right 1985   Benign Tumor removed    LEG SURGERY Left 2004   Fibula and Tibia Rod placement    TUBAL LIGATION  1993   Social History   Social History Narrative   Not on  file   Immunization History  Administered Date(s) Administered   Influenza, High Dose Seasonal PF 01/04/2018, 11/18/2018   PFIZER(Purple Top)SARS-COV-2 Vaccination 04/05/2019, 04/26/2019, 12/13/2019   Zoster Recombinat (Shingrix) 06/24/2017, 08/26/2017     Objective: Vital Signs: BP 114/72 (BP Location: Left Arm, Patient Position: Sitting, Cuff Size: Large)    Pulse 74    Ht 5\' 2"  (1.575 m)    Wt 174 lb 12.8 oz (79.3 kg)    BMI 31.97 kg/m    Physical Exam Vitals and nursing note reviewed.  Constitutional:      Appearance: She is well-developed.  HENT:     Head: Normocephalic and atraumatic.  Eyes:     Conjunctiva/sclera: Conjunctivae normal.  Pulmonary:     Effort: Pulmonary effort is normal.  Abdominal:     Palpations: Abdomen is soft.  Musculoskeletal:     Cervical back: Normal range of motion.  Skin:    General: Skin is warm and dry.     Capillary Refill: Capillary refill takes less than 2 seconds.  Neurological:     Mental Status: She is alert and oriented to person, place, and time.  Psychiatric:        Behavior: Behavior normal.     Musculoskeletal Exam: C-spine has good range of motion with no discomfort.  Thoracic spine has good range of motion with no midline spinal tenderness.  Some midline spinal tenderness in the lumbar region.  Minimal SI joint tenderness to palpation.  No discomfort with extension or lateral rotation of the lumbar spine.  She has some discomfort with flexion of the lumbar spine.  Shoulder joints, elbow joints, wrist joints, MCPs, PIPs, DIPs have good range of motion with no synovitis.  She was able to make a complete fist bilaterally.  Hip joints have good range of motion with no groin pain.  No tenderness over trochanteric bursa bilaterally.  Knee joints have good range of motion with no warmth or effusion.  Ankle joints have good range of motion with no tenderness or joint swelling.  Some tenderness along the Achilles of the right foot.  No  tenderness along the plantar fascia.  No tenderness over MTP joints.  Some PIP and DIP thickening consistent with osteoarthritis of both feet noted.  CDAI Exam: CDAI Score: -- Patient Global: --; Provider Global: -- Swollen: --; Tender: -- Joint Exam 04/15/2021   No joint exam has been documented for this visit   There is currently no information documented on the homunculus. Go to the Rheumatology activity and complete the homunculus joint exam.  Investigation: No additional findings.  Imaging: No results found.  Recent Labs: Lab Results  Component Value Date   WBC 7.4 01/14/2021   HGB 14.6 01/14/2021   PLT 277 01/14/2021   NA 138 01/14/2021   K 4.6 01/14/2021   CL 102 01/14/2021   CO2 27 01/14/2021  GLUCOSE 107 (H) 01/14/2021   BUN 20 01/14/2021   CREATININE 1.08 (H) 01/14/2021   BILITOT 0.4 01/14/2021   ALKPHOS 72 10/12/2018   AST 22 01/14/2021   ALT 18 01/14/2021   PROT 7.9 01/14/2021   ALBUMIN 3.6 10/12/2018   CALCIUM 9.9 01/14/2021   GFRAA 59 (L) 06/28/2020   QFTBGOLD Negative 07/22/2016   QFTBGOLDPLUS NEGATIVE 03/14/2020    Speciality Comments: Inadequate response to Stelara, and Enbrel, methotrexate caused elevated creatinine  Procedures:  No procedures performed Allergies: Patient has no known allergies.     Assessment / Plan:     Visit Diagnoses: Psoriatic arthritis ([place]): She has no synovitis or dactylitis on examination.  She has not had any signs or symptoms of a psoriatic arthritis flare.  She is clinically doing well on Cosentyx 150 mg subcutaneous injections every 2 weeks.  She has no active psoriasis at this time.  No evidence of Achilles tendinitis or plantar fasciitis.  Minimal SI joint tenderness upon palpation.  She presents today with increased midline spinal tenderness in the lumbar region after mopping and sweeping on 04/04/2021.  Her discomfort has gradually been improving.  X-rays of the lumbar spine were updated today.  Referral to physical  therapy was offered but she declined at this time.  She plans on performing home exercises.  She is not experiencing any other joint pain or inflammation at this time.  She will remain on Cosentyx as prescribed.  She was advised to notify us if she develops any signs or symptoms of a flare.  Other psoriasis: She has no active psoriasis at this time.  High risk medication use - Cosentyx 150 mg sq q 2 weeks-started on 01/05/19.  D/c Remicade-last infusion 11/23/18 due to low grade fevers. CBC and CMP drawn on 01/14/21.  She is due to update lab work today.  Orders released.  Her next lab work will be due in May and every 3 months.  Standing orders for CBC and CMP remain in place.  TB gold negative on 03/14/20.  Order for TB gold released today.  - Plan: CBC with Differential/Platelet, COMPLETE METABOLIC PANEL WITH GFR, QuantiFERON-TB Gold Plus Patient was diagnosed with COVID-19 at the end of December 2022.  She held Cosentyx until her symptoms completely resolved.  Discussed the importance of always holding Cosentyx if she develop signs or symptoms of infection and to resume once the infection has completely cleared.  Screening for tuberculosis -Order for TB Gold released today. Plan: QuantiFERON-TB Gold Plus  Sacroiliitis ([place]): She has no SI joint tenderness upon palpation.  Primary osteoarthritis of both knees - X-rays obtained showed bilateral moderate osteoarthritis and moderate chondromalacia patella.  She has good range of motion of both knee joints on examination today with no discomfort.  No warmth or effusion of knee joints noted.  Osteopenia of multiple sites - DEXA updated on 07/29/19: The BMD measured at Femur Neck Left is 0.809 g/cm2 with a T-score of -1.6.  DEXA ordered by Dr. Justin Mend.  Due to update DEXA in May 2023.  No recent falls or fractures.  She is taking a calcium and vitamin D supplement daily.  Iliotibial band syndrome of left side: Resolved.  No tenderness upon palpation today.    Acute midline low back pain without sciatica -She presents today with increased discomfort in her lower back which started on 04/04/2021.  The patient was performing household activities including mopping and sweeping which brought on her symptoms.  She did not have any  injury or fall prior to the onset of symptoms.  She describes the pain as an aching sensation which has been midline.  She has not had any symptoms of radiculopathy or lower extremity muscle weakness.  She has not been experiencing any muscle spasms.  Her discomfort has gradually been improving over the past several days.  She has tried using heat for symptomatic relief.  She has full range of motion of the lumbar spine with some discomfort with flexion.  No discomfort with extension.  Some midline spinal tenderness in the lumbar region.  Minimal SI joint tenderness noted.  No lower extremity muscle weakness was noted.  X-rays of the lumbar spine were updated today.  Different treatment options were discussed.  Physical therapy was offered but she would like to hold off at this time.  She plans on performing home exercises.  A handout of core strengthening and back exercises were provided to the patient.  A Salonpas sample was also provided to the patient today to apply as needed for pain relief.  She was advised to notify us if her symptoms persist or worsen.  Plan: XR Lumbar Spine 2-3 Views  Other medical conditions are listed as follows:   Granuloma annulare  History of hypercholesterolemia  History of chronic kidney disease: CMP with GFR was updated today.  History of hypertension: Blood pressure was 114/72 today in the office.  Vitamin D deficiency: She is taking a calcium and vitamin D supplement on a daily basis.  History of anxiety: She is taking Paxil as prescribed.    Orders: Orders Placed This Encounter  Procedures   XR Lumbar Spine 2-3 Views   CBC with Differential/Platelet   COMPLETE METABOLIC PANEL WITH GFR    QuantiFERON-TB Gold Plus   No orders of the defined types were placed in this encounter.    Follow-Up Instructions: Return in about 5 months (around 09/12/2021) for Psoriatic arthritis.   Ofilia Neas, PA-C  Note - This record has been created using Dragon software.  Chart creation errors have been sought, but may not always  have been located. Such creation errors do not reflect on  the standard of medical care.

## 2021-04-15 ENCOUNTER — Encounter: Payer: Self-pay | Admitting: Physician Assistant

## 2021-04-15 ENCOUNTER — Ambulatory Visit: Payer: Federal, State, Local not specified - PPO | Admitting: Physician Assistant

## 2021-04-15 ENCOUNTER — Other Ambulatory Visit: Payer: Self-pay | Admitting: Family Medicine

## 2021-04-15 ENCOUNTER — Ambulatory Visit: Payer: Self-pay

## 2021-04-15 ENCOUNTER — Other Ambulatory Visit: Payer: Self-pay

## 2021-04-15 VITALS — BP 114/72 | HR 74 | Ht 62.0 in | Wt 174.8 lb

## 2021-04-15 DIAGNOSIS — Z87448 Personal history of other diseases of urinary system: Secondary | ICD-10-CM

## 2021-04-15 DIAGNOSIS — M461 Sacroiliitis, not elsewhere classified: Secondary | ICD-10-CM

## 2021-04-15 DIAGNOSIS — L408 Other psoriasis: Secondary | ICD-10-CM

## 2021-04-15 DIAGNOSIS — Z79899 Other long term (current) drug therapy: Secondary | ICD-10-CM

## 2021-04-15 DIAGNOSIS — Z1231 Encounter for screening mammogram for malignant neoplasm of breast: Secondary | ICD-10-CM

## 2021-04-15 DIAGNOSIS — L4050 Arthropathic psoriasis, unspecified: Secondary | ICD-10-CM | POA: Diagnosis not present

## 2021-04-15 DIAGNOSIS — Z111 Encounter for screening for respiratory tuberculosis: Secondary | ICD-10-CM

## 2021-04-15 DIAGNOSIS — M545 Low back pain, unspecified: Secondary | ICD-10-CM | POA: Diagnosis not present

## 2021-04-15 DIAGNOSIS — Z8679 Personal history of other diseases of the circulatory system: Secondary | ICD-10-CM

## 2021-04-15 DIAGNOSIS — M8589 Other specified disorders of bone density and structure, multiple sites: Secondary | ICD-10-CM

## 2021-04-15 DIAGNOSIS — E559 Vitamin D deficiency, unspecified: Secondary | ICD-10-CM

## 2021-04-15 DIAGNOSIS — L920 Granuloma annulare: Secondary | ICD-10-CM

## 2021-04-15 DIAGNOSIS — M7632 Iliotibial band syndrome, left leg: Secondary | ICD-10-CM

## 2021-04-15 DIAGNOSIS — M170 Bilateral primary osteoarthritis of knee: Secondary | ICD-10-CM

## 2021-04-15 DIAGNOSIS — Z8659 Personal history of other mental and behavioral disorders: Secondary | ICD-10-CM

## 2021-04-15 DIAGNOSIS — Z8639 Personal history of other endocrine, nutritional and metabolic disease: Secondary | ICD-10-CM

## 2021-04-15 NOTE — Patient Instructions (Addendum)
Standing Labs We placed an order today for your standing lab work.   Please have your standing labs drawn in May and every 3 months   If possible, please have your labs drawn 2 weeks prior to your appointment so that the provider can discuss your results at your appointment.  Please note that you may see your imaging and lab results in [facility] before we have reviewed them. We may be awaiting multiple results to interpret others before contacting you. Please allow our office up to 72 hours to thoroughly review all of the results before contacting the office for clarification of your results.  We have open lab daily: Monday through Thursday from 1:30-4:30 PM and Friday from 1:30-4:00 PM at the office of Dr. Bo Merino, [facility].   Please be advised, all patients with office appointments requiring lab work will take precedent over walk-in lab work.  If possible, please come for your lab work on Monday and Friday afternoons, as you may experience shorter wait times. The office is located at [address], [place], [place], [place] [address] No appointment is necessary.   Labs are drawn by [facility]. Please bring your co-pay at the time of your lab draw.  You may receive a bill from [facility] for your lab work.  Please note if you are on Hydroxychloroquine and and an order has been placed for a Hydroxychloroquine level, you will need to have it drawn 4 hours or more after your last dose.  If you wish to have your labs drawn at another location, please call the office 24 hours in advance to send orders.  If you have any questions regarding directions or hours of operation,  please call [phone].   As a reminder, please drink plenty of water prior to coming for your lab work. Thanks!    Core Strength Exercises Ask your health care provider which exercises are safe for you. Do exercises exactly as told by your health care provider and adjust them as directed. It is normal  to feel mild stretching, pulling, tightness, or discomfort as you do these exercises. Stop right away if you feel sudden pain or your pain gets worse. Do not begin these exercises until told by your health care provider. Benefits of core strength exercises Core exercises help to build strength in the muscles between your ribs and your hips (abdominal muscles). These muscles help to support your body and keep your spine stable. It is important to maintain strength in your core to prevent injury and pain. Some activities, such as yoga and Pilates, can help to strengthen core muscles. You can also strengthen core muscles with exercises at home. It is important to talk to your health care provider before you start a new exercise routine. Core strength exercises can: Reduce back pain. Help to rebuild strength after a back or spine injury. Help to prevent injury during physical activity, especially injuries to the back, hips, and knees. How to do core strength exercises Repeat these exercises 10-15 times, or until you are tired. Stop if you feel any pain while doing these exercises. Contact your health care provider if your pain continues or gets worse while doing or after doing core strength exercises. For strength exercises that are done on the floor, use a padded yoga mat or an exercise mat. Bridging  Lie on your back on a firm surface with your knees bent and your feet flat on the floor. Raise your hips so that your knees, hips,  and shoulders together form a straight line. Do not excessively arch your back. Keep your abdominal muscles tight. Hold this position for 3-5 seconds. Slowly lower your hips to the starting position. Let your muscles relax completely between repetitions. Single-leg bridge  Lie on your back on a firm surface with your knees bent and your feet flat on the floor. Raise your hips so that your knees, hips, and shoulders together form a straight line. Do not excessively arch  your back. Keep your abdominal muscles tight. Lift one foot off the floor while maintaining alignment in your knees, hips, and shoulders. Then, completely straighten the lifted leg. Hold this position for 3-5 seconds. Put the straight leg back down in the bent position. Slowly lower your hips to the starting position. Repeat these steps using your other leg. Side bridge  Lie on your side with your knees bent. Prop yourself up on the elbow that is near the floor. Using your abdominal muscles and the elbow you are propped up on, raise your body off the floor. Raise your hip so that your shoulder, hip, and foot together form a straight line. Hold this position for 10 seconds. Keep your head and neck raised and away from your shoulder (in their normal, neutral position). Keep your abdominal muscles tight. Slowly lower your hip to the starting position. Repeat and try to hold this position longer, working your way up to 30 seconds. Abdominal crunch  Lie on your back on a firm surface. Bend your knees and keep your feet flat on the floor. Cross your arms over your chest. Without bending your neck, tip your chin slightly toward your chest. Tighten your abdominal muscles as you lift your chest just high enough to lift your shoulder blades off the floor. Do not hold your breath. You can do this with short lifts or long lifts. Slowly return to the starting position. Bird dog  Get on your hands and knees, with your legs shoulder-width apart and your arms under your shoulders. Keep your back straight. Tighten your abdominal muscles. Raise one of your legs off the floor and straighten it. Try to keep it parallel to the floor. Slowly lower your leg to the starting position. Raise one of your arms off the floor and straighten it. Try to keep it parallel to the floor. Slowly lower your arm to the starting position. Repeat with the other arm and leg. If possible, try raising a leg and an arm at the same  time, on opposite sides of the body. For example, raise your left hand and your right leg. Rosilyn Mings on your belly. Prop up your body onto your forearms and your feet, keeping your legs straight. Your body should make a straight line between your shoulders and feet. Hold this position for 10 seconds while keeping your abdominal muscles tight. Lower your body to the starting position. Repeat and try to hold this position longer, working your way up to 30 seconds. Cross-core strengthening  Stand with your feet shoulder-width apart. Hold a [place] out in front of you. Keep your arms straight. Tighten your abdominal muscles and slowly rotate at your waist from side to side. Keep your feet flat. Once you are comfortable, try repeating this exercise with a heavier [place]o. Top core strengthening  Stand about 18 inches (46 cm) out from a wall, with your back to the wall. Keep your feet flat and shoulder-width apart. Tighten your abdominal muscles. Bend your hips and knees. Slowly reach between  your legs to touch the wall behind you. Slowly stand back up. Raise your arms over your head and reach behind you. Return to the starting position. General tips Do not do any exercises that cause pain. If you have pain while exercising, talk to your health care provider. Always stretch before and after doing these exercises. This can help prevent injury. Maintain a healthy weight. Ask your health care provider what weight is healthy for you. Contact a health care provider if: You have back pain that gets worse or does not go away. You feel pain while doing core strength exercises. Get help right away if: You have severe pain that does not get better with medicine. Summary Core exercises help to build strength in the muscles between your ribs and your waist. Core muscles help to support your body and keep your spine stable. Some activities, such as yoga and Pilates, can help to strengthen core  muscles. Core strength exercises can help back pain and can prevent injury. Stop if you feel any pain while doing core strength exercises. This information is not intended to replace advice given to you by your health care provider. Make sure you discuss any questions you have with your health care provider. Document Revised: 08/21/2020 Document Reviewed: 11/22/2019 Elsevier Patient Education  [place]. Back Exercises These exercises help to make your trunk and back strong. They also help to keep the lower back flexible. Doing these exercises can help to prevent or lessen pain in your lower back. If you have back pain, try to do these exercises 2-3 times each day or as told by your doctor. As you get better, do the exercises once each day. Repeat the exercises more often as told by your doctor. To stop back pain from coming back, do the exercises once each day, or as told by your doctor. Do exercises exactly as told by your doctor. Stop right away if you feel sudden pain or your pain gets worse. Exercises Single knee to chest Do these steps 3-5 times in a row for each leg: Lie on your back on a firm bed or the floor with your legs stretched out. Bring one knee to your chest. Grab your knee or thigh with both hands and hold it in place. Pull on your knee until you feel a gentle stretch in your lower back or butt. Keep doing the stretch for 10-30 seconds. Slowly let go of your leg and straighten it. Pelvic tilt Do these steps 5-10 times in a row: Lie on your back on a firm bed or the floor with your legs stretched out. Bend your knees so they point up to the ceiling. Your feet should be flat on the floor. Tighten your lower belly (abdomen) muscles to press your lower back against the floor. This will make your tailbone point up to the ceiling instead of pointing down to your feet or the floor. Stay in this position for 5-10 seconds while you gently tighten your muscles and breathe  evenly. Cat-cow Do these steps until your lower back bends more easily: Get on your hands and knees on a firm bed or the floor. Keep your hands under your shoulders, and keep your knees under your hips. You may put padding under your knees. Let your head hang down toward your chest. Tighten (contract) the muscles in your belly. Point your tailbone toward the floor so your lower back becomes rounded like the back of a cat. Stay in this position for  5 seconds. Slowly lift your head. Let the muscles of your belly relax. Point your tailbone up toward the ceiling so your back forms a sagging arch like the back of a cow. Stay in this position for 5 seconds.  Press-ups Do these steps 5-10 times in a row: Lie on your belly (face-down) on a firm bed or the floor. Place your hands near your head, about shoulder-width apart. While you keep your back relaxed and keep your hips on the floor, slowly straighten your arms to raise the top half of your body and lift your shoulders. Do not use your back muscles. You may change where you place your hands to make yourself more comfortable. Stay in this position for 5 seconds. Keep your back relaxed. Slowly return to lying flat on the floor.  Bridges Do these steps 10 times in a row: Lie on your back on a firm bed or the floor. Bend your knees so they point up to the ceiling. Your feet should be flat on the floor. Your arms should be flat at your sides, next to your body. Tighten your butt muscles and lift your butt off the floor until your waist is almost as high as your knees. If you do not feel the muscles working in your butt and the back of your thighs, slide your feet 1-2 inches (2.5-5 cm) farther away from your butt. Stay in this position for 3-5 seconds. Slowly lower your butt to the floor, and let your butt muscles relax. If this exercise is too easy, try doing it with your arms crossed over your chest. Belly crunches Do these steps 5-10 times in a  row: Lie on your back on a firm bed or the floor with your legs stretched out. Bend your knees so they point up to the ceiling. Your feet should be flat on the floor. Cross your arms over your chest. Tip your chin a little bit toward your chest, but do not bend your neck. Tighten your belly muscles and slowly raise your chest just enough to lift your shoulder blades a tiny bit off the floor. Avoid raising your body higher than that because it can put too much stress on your lower back. Slowly lower your chest and your head to the floor. Back lifts Do these steps 5-10 times in a row: Lie on your belly (face-down) with your arms at your sides, and rest your forehead on the floor. Tighten the muscles in your legs and your butt. Slowly lift your chest off the floor while you keep your hips on the floor. Keep the back of your head in line with the curve in your back. Look at the floor while you do this. Stay in this position for 3-5 seconds. Slowly lower your chest and your face to the floor. Contact a doctor if: Your back pain gets a lot worse when you do an exercise. Your back pain does not get better within 2 hours after you exercise. If you have any of these problems, stop doing the exercises. Do not do them again unless your doctor says it is okay. Get help right away if: You have sudden, very bad back pain. If this happens, stop doing the exercises. Do not do them again unless your doctor says it is okay. This information is not intended to replace advice given to you by your health care provider. Make sure you discuss any questions you have with your health care provider. Document Revised: 05/09/2020 Document Reviewed: 05/09/2020  Elsevier Patient Education  2022 [employer].

## 2021-04-16 ENCOUNTER — Telehealth: Payer: Self-pay | Admitting: *Deleted

## 2021-04-16 ENCOUNTER — Ambulatory Visit: Payer: Federal, State, Local not specified - PPO | Admitting: Dermatology

## 2021-04-16 DIAGNOSIS — D224 Melanocytic nevi of scalp and neck: Secondary | ICD-10-CM | POA: Diagnosis not present

## 2021-04-16 DIAGNOSIS — D485 Neoplasm of uncertain behavior of skin: Secondary | ICD-10-CM

## 2021-04-16 DIAGNOSIS — L409 Psoriasis, unspecified: Secondary | ICD-10-CM | POA: Diagnosis not present

## 2021-04-16 DIAGNOSIS — M47819 Spondylosis without myelopathy or radiculopathy, site unspecified: Secondary | ICD-10-CM

## 2021-04-16 DIAGNOSIS — M47816 Spondylosis without myelopathy or radiculopathy, lumbar region: Secondary | ICD-10-CM

## 2021-04-16 NOTE — Progress Notes (Signed)
CBC WNL.  Creatinine is slightly elevated but continues to trend up.  GFR is low at 53. Please clarify if she is taking any NSAIDs?   Glucose is 108.   Rest of CMP WNL.   She should avoid the use of NSAIDs.

## 2021-04-16 NOTE — Telephone Encounter (Signed)
-----   Message from Ofilia Neas, PA-C sent at 04/16/2021 12:30 PM EST ----- X-rays of lumbar spine were consistent with mild degenerative changes and facet joint arthropathy. Please notify the patient.   Please offer PT.

## 2021-04-16 NOTE — Patient Instructions (Signed)

## 2021-04-18 LAB — CBC WITH DIFFERENTIAL/PLATELET
Absolute Monocytes: 603 cells/uL (ref 200–950)
Basophils Absolute: 67 cells/uL (ref 0–200)
Basophils Relative: 1 %
Eosinophils Absolute: 188 cells/uL (ref 15–500)
Eosinophils Relative: 2.8 %
HCT: 41.6 % (ref 35.0–45.0)
Hemoglobin: 14.1 g/dL (ref 11.7–15.5)
Lymphs Abs: 2077 cells/uL (ref 850–3900)
MCH: 29.1 pg (ref 27.0–33.0)
MCHC: 33.9 g/dL (ref 32.0–36.0)
MCV: 86 fL (ref 80.0–100.0)
MPV: 11 fL (ref 7.5–12.5)
Monocytes Relative: 9 %
Neutro Abs: 3765 cells/uL (ref 1500–7800)
Neutrophils Relative %: 56.2 %
Platelets: 292 10*3/uL (ref 140–400)
RBC: 4.84 10*6/uL (ref 3.80–5.10)
RDW: 12.3 % (ref 11.0–15.0)
Total Lymphocyte: 31 %
WBC: 6.7 10*3/uL (ref 3.8–10.8)

## 2021-04-18 LAB — QUANTIFERON-TB GOLD PLUS
Mitogen-NIL: >10 IU/mL
NIL: 0.05 IU/mL
QuantiFERON-TB Gold Plus: NEGATIVE
TB1-NIL: 0 IU/mL
TB2-NIL: 0 IU/mL

## 2021-04-18 LAB — COMPLETE METABOLIC PANEL WITH GFR
AG Ratio: 1.3 (calc) (ref 1.0–2.5)
ALT: 17 U/L (ref 6–29)
AST: 18 U/L (ref 10–35)
Albumin: 4.4 g/dL (ref 3.6–5.1)
Alkaline phosphatase (APISO): 77 U/L (ref 37–153)
BUN/Creatinine Ratio: 23 (calc) — ABNORMAL HIGH (ref 6–22)
BUN: 25 mg/dL (ref 7–25)
CO2: 26 mmol/L (ref 20–32)
Calcium: 9.6 mg/dL (ref 8.6–10.4)
Chloride: 101 mmol/L (ref 98–110)
Creat: 1.11 mg/dL — ABNORMAL HIGH (ref 0.60–1.00)
Globulin: 3.4 g/dL (calc) (ref 1.9–3.7)
Glucose, Bld: 108 mg/dL — ABNORMAL HIGH (ref 65–99)
Potassium: 4.6 mmol/L (ref 3.5–5.3)
Sodium: 136 mmol/L (ref 135–146)
Total Bilirubin: 0.5 mg/dL (ref 0.2–1.2)
Total Protein: 7.8 g/dL (ref 6.1–8.1)
eGFR: 53 mL/min/{1.73_m2} — ABNORMAL LOW (ref >=60)

## 2021-04-18 NOTE — Progress Notes (Signed)
TB gold negative

## 2021-05-05 ENCOUNTER — Encounter: Payer: Self-pay | Admitting: Dermatology

## 2021-05-05 NOTE — Progress Notes (Signed)
° °  Follow-Up Visit   Subjective  Morgan Jensen is a 71 y.o. female who presents for the following: Skin Problem (Pt has a spot of concern on the R neck that started off itching but it does not anymore. Pt here for eval ).  Growth on right side of neck, discussed psoriasis Location:  Duration:  Quality:  Associated Signs/Symptoms: Modifying Factors:  Severity:  Timing: Context:   Objective  Well appearing patient in no apparent distress; mood and affect are within normal limits. Right Anterior Neck Pearly 5 mm papule, dermoscopy amorphous       Left Elbow - Posterior, Right Elbow - Posterior Patient is pleased that she has had no significant flare .    All sun exposed areas plus back examined.   Assessment & Plan    Neoplasm of uncertain behavior of skin Right Anterior Neck  Skin / nail biopsy Type of biopsy: tangential   Informed consent: discussed and consent obtained   Timeout: patient name, date of birth, surgical site, and procedure verified   Anesthesia: the lesion was anesthetized in a standard fashion   Anesthetic:  1% lidocaine w/ epinephrine 1-100,000 local infiltration Instrument used: flexible razor blade   Hemostasis achieved with: ferric subsulfate and electrodesiccation   Outcome: patient tolerated procedure well   Post-procedure details: wound care instructions given    Specimen 1 - Surgical pathology Differential Diagnosis: R/O BCC VS SCC  Check Margins: No  Psoriasis Left Elbow - Posterior; Right Elbow - Posterior  No change in current therapy.      I, Lavonna Monarch, MD, have reviewed all documentation for this visit.  The documentation on 05/05/21 for the exam, diagnosis, procedures, and orders are all accurate and complete.

## 2021-05-14 ENCOUNTER — Ambulatory Visit (HOSPITAL_COMMUNITY): Payer: Federal, State, Local not specified - PPO | Attending: Rheumatology | Admitting: Physical Therapy

## 2021-05-14 ENCOUNTER — Other Ambulatory Visit: Payer: Self-pay

## 2021-05-14 DIAGNOSIS — Z789 Other specified health status: Secondary | ICD-10-CM | POA: Diagnosis present

## 2021-05-14 DIAGNOSIS — M47816 Spondylosis without myelopathy or radiculopathy, lumbar region: Secondary | ICD-10-CM | POA: Insufficient documentation

## 2021-05-14 DIAGNOSIS — M47819 Spondylosis without myelopathy or radiculopathy, site unspecified: Secondary | ICD-10-CM | POA: Diagnosis not present

## 2021-05-14 NOTE — Patient Instructions (Signed)
Access Code: [email or web address] ?URL: [email or web address] ?Date: 05/14/2021 ?Prepared by: Adalberto Cole ? ?Exercises ?90/90 SI Joint Self-Correction with Dowel - 1-2 x daily - 7 x weekly - 3 reps - 5s hold ? ?

## 2021-05-14 NOTE — Therapy (Signed)
[facility] ?[address] ?[place], [place], [address] ?Phone: [phone]   Fax:  [phone] ? ?Physical Therapy Evaluation ? ?Patient Details  ?Name: Morgan Jensen ?MRN: [id number] ?Date of Birth: [birth year] ?Referring Provider (PT): Bo Merino, MD ? ? ?Encounter Date: 05/14/2021 ? ? PT End of Session - 05/14/21 1518   ? ? Visit Number 1   ? Number of Visits 1   ? Date for PT Re-Evaluation --   Single time visit  ? Authorization Type BCBS/Federal EMP PPO   ? Authorization Time Period 08/28/1984 - Current   ? Authorization - Visit Number 1   ? Authorization - Number of Visits 50   ? PT Start Time 4034   ? PT Stop Time 1515   ? PT Time Calculation (min) 42 min   ? ?  ?  ? ?  ? ? ?Past Medical History:  ?Diagnosis Date  ? Anxiety 01/18/2016  ? Arthritis   ? psoriatic arthritis   ? CKD (chronic kidney disease) 01/18/2016  ? Elevated cholesterol 01/18/2016  ? Hypertension 01/18/2016  ? Osteoarthritis of knee 01/18/2016  ? Osteopenia   ? per patient, PCP ordered DEXA   ? Psoriasis   ? ? ?Past Surgical History:  ?Procedure Laterality Date  ? BREAST EXCISIONAL BIOPSY Right 1986  ? BREAST SURGERY Right 1985  ? Benign Tumor removed   ? LEG SURGERY Left 2004  ? Fibula and Tibia Rod placement   ? TUBAL LIGATION  1993  ? ? ?There were no vitals filed for this visit. ? ? ? Subjective Assessment - 05/14/21 1442   ? ? Subjective Patient reports that she was diagnosed with arthritis several years ago after she noticed that any household chores which required a bent forward position would cause and increase in pain. It has always resolved following brief periods of relaxation, but starting in January she noticed that her pain stopped going away after periods of rest. She states that the pain levels would last several days afterward, but he found that heating pads to the low back were effective in limiting her pain. She states that her pain level has improved since this worsening in  January to the point that she was considering canceling today's evaluation, but she thought she may benefit in avoiding this from occurring in the future.   ? How long can you sit comfortably? 30 minutes   ? How long can you stand comfortably? 45 minutes   ? How long can you walk comfortably? 30 minutes   ? Diagnostic tests XR of lumbar spine   ? Patient Stated Goals To avoid the start of pain following physical activities at home.   ? Currently in Pain? No/denies   ? ?  ?  ? ?  ? ? ? ? ? [facility] PT Assessment - 05/14/21 0001   ? ?  ? Assessment  ? Medical Diagnosis OA of Lumbar Spine   ? Referring Provider (PT) Bo Merino, MD   ? Onset Date/Surgical Date 03/10/21   ?  ? Precautions  ? Precautions None   ?  ? Restrictions  ? Weight Bearing Restrictions No   ?  ? Balance Screen  ? Has the patient fallen in the past 6 months No   ?  ? Home Environment  ? Living Environment Private residence   ? Living Arrangements Spouse/significant other   ? Available Help at Discharge Family   ? Type of Home House   ?  Home Layout Two level   ? Alternate Level Stairs-Number of Steps 14   ? Alternate Level Stairs-Rails Can reach both   ?  ? Prior Function  ? Level of Independence Independent   ? Vocation Retired   ?  ? Cognition  ? Overall Cognitive Status Within Functional Limits for tasks assessed   ?  ? Observation/Other Assessments  ? Focus on Therapeutic Outcomes (FOTO)  72% funtion(predicted 78% by visit 8)   ?  ? Sensation  ? Light Touch --   Denies sensation changes  ?  ? ROM / Strength  ? AROM / PROM / Strength Strength;AROM   ?  ? AROM  ? AROM Assessment Site Lumbar   ? Lumbar Flexion 100%   able to touch toes  ? Lumbar Extension 100%   ? Lumbar - Right Side Bend 100%   ? Lumbar - Left Side Bend 100%   ? Lumbar - Right Rotation 100%   ? Lumbar - Left Rotation 10% restricted   ?  ? Strength  ? Overall Strength Within functional limits for tasks performed   ?  ? Special Tests  ?  Special Tests Lumbar;Sacrolliac Tests   ?  Lumbar Tests Slump Test;FABER test;Straight Leg Raise   ? Sacroiliac Tests  Pelvic Compression   ?  ? FABER test  ? findings Negative   ?  ? Slump test  ? Findings Negative   ?  ? Straight Leg Raise  ? Findings Negative   ?  ? Pelvic Dictraction  ? Findings Negative   ?  ? Pelvic Compression  ? Findings Negative   ?  ? Sacral Compression  ? Findings Negative   ? ?  ?  ? ?  ? ? ? ? ? ? ? ? ? ? ? ? ? ?Objective measurements completed on examination: See above findings.  ? ? ? ? ? ? ? ? ? ? ? ? ? ? PT Education - 05/14/21 1517   ? ? Education Details HEP and POC   ? Person(s) Educated Patient   ? Methods Explanation;Handout   ? Comprehension Verbalized understanding   ? ?  ?  ? ?  ? ? ? PT Short Term Goals - 05/14/21 1523   ? ?  ? PT SHORT TERM GOAL #1  ? Title One time evaluation. No goals necessary.   ? Status Achieved   ? ?  ?  ? ?  ? ? ? ? ? ? ? ? ? ? ? ? Plan - 05/14/21 1521   ? ? Clinical Impression Statement Patient is a 71 y.o. female presenting to physical therapy with c/o of previous SI joint pain. She presents without pain or any limiting deficits in strength, ROM, endurance, postural impairments, spinal mobility and functional mobility with ADL. Patient will not benefit from skilled physical therapy and was discharged after today's evaluation.   ? Personal Factors and Comorbidities Age   ? Stability/Clinical Decision Making Stable/Uncomplicated   ? Clinical Decision Making Low   ? Rehab Potential --   N/A  ? PT Frequency One time visit   ? PT Next Visit Plan No follow up scheduled due to one time evaluation.   ? PT Home Exercise Plan 90/90 SI Joint Self-Correction with Dowel - 1-2 x daily - 7 x weekly - 3 reps - 5s hold   ? Consulted and Agree with Plan of Care Patient   ? ?  ?  ? ?  ? ? ?  Patient will benefit from skilled therapeutic intervention in order to improve the following deficits and impairments:    ? ?Visit Diagnosis: ?Osteoarthritis of lumbar spine, unspecified spinal osteoarthritis complication  status ? ?Deficit in activities of daily living (ADL) ? ? ? ? ?Problem List ?Patient Active Problem List  ? Diagnosis Date Noted  ? Osteopenia of multiple sites 10/04/2019  ? 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 Osteoarthritis of knee 01/18/2016  ? Hypertension 01/18/2016  ? Anxiety 01/18/2016  ? Elevated cholesterol 01/18/2016  ? CKD (chronic kidney disease) 01/18/2016  ? Psoriatic arthritis ([place]) 10/27/2015  ? Other psoriasis 10/27/2015  ? ? ?Adalberto Cole, PT ?05/14/2021, 3:25 PM ? ? ?[facility] ?[address] ?[place], [place], [address] ?Phone: [phone]   Fax:  [phone] ? ?Name: Morgan Jensen ?MRN: [id number] ?Date of Birth: 24-Sep-1950 ? ? ?

## 2021-05-16 ENCOUNTER — Ambulatory Visit
Admission: RE | Admit: 2021-05-16 | Discharge: 2021-05-16 | Disposition: A | Discharge status: Home / Self Care | Payer: Federal, State, Local not specified - PPO | Source: Ambulatory Visit | Attending: Family Medicine | Admitting: Family Medicine

## 2021-05-16 DIAGNOSIS — Z1231 Encounter for screening mammogram for malignant neoplasm of breast: Secondary | ICD-10-CM

## 2021-06-04 ENCOUNTER — Other Ambulatory Visit: Payer: Self-pay | Admitting: Physician Assistant

## 2021-06-04 NOTE — Progress Notes (Deleted)
? ?Office Visit Note ? ?Patient: Morgan Jensen             ?Date of Birth: 1950/04/01           ?MRN: [id number]             ?PCP: Glenis Smoker, MD ?Referring: Maurice Small, MD ?Visit Date: 06/18/2021 ?Occupation: '@GUAROCC'$ @ ? ?Subjective:  ?No chief complaint on file. ? ? ?History of Present Illness: Morgan Jensen is a 72 y.o. female ***  ? ?Activities of Daily Living:  ?Patient reports morning stiffness for *** {minute/hour:19697}.   ?Patient {ACTIONS;DENIES/REPORTS:21021675::"Denies"} nocturnal pain.  ?Difficulty dressing/grooming: {ACTIONS;DENIES/REPORTS:21021675::"Denies"} ?Difficulty climbing stairs: {ACTIONS;DENIES/REPORTS:21021675::"Denies"} ?Difficulty getting out of chair: {ACTIONS;DENIES/REPORTS:21021675::"Denies"} ?Difficulty using hands for taps, buttons, cutlery, and/or writing: {ACTIONS;DENIES/REPORTS:21021675::"Denies"} ? ?No Rheumatology ROS completed.  ? ?PMFS History:  ?Patient Active Problem List  ? Diagnosis Date Noted  ? Osteopenia of multiple sites 10/04/2019  ? 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 Osteoarthritis of knee 01/18/2016  ? Hypertension 01/18/2016  ? Anxiety 01/18/2016  ? Elevated cholesterol 01/18/2016  ? CKD (chronic kidney disease) 01/18/2016  ? Psoriatic arthritis ([place]) 10/27/2015  ? Other psoriasis 10/27/2015  ?  ?Past Medical History:  ?Diagnosis Date  ? Anxiety 01/18/2016  ? Arthritis   ? psoriatic arthritis   ? CKD (chronic kidney disease) 01/18/2016  ? Elevated cholesterol 01/18/2016  ? Hypertension 01/18/2016  ? Osteoarthritis of knee 01/18/2016  ? Osteopenia   ? per patient, PCP ordered DEXA   ? Psoriasis   ?  ?Family History  ?Problem Relation Age of Onset  ? Aneurysm Mother   ? Cancer Father   ?     Lung Cancer  ? Cancer Brother   ?     Esopheageal Cancer  ? ?Past Surgical History:  ?Procedure Laterality Date  ? BREAST EXCISIONAL BIOPSY Right 1986  ? BREAST SURGERY Right 1985  ? Benign Tumor removed   ? LEG SURGERY Left 2004  ? Fibula and Tibia Rod placement   ?  TUBAL LIGATION  1993  ? ?Social History  ? ?Social History Narrative  ? Not on file  ? ?Immunization History  ?Administered Date(s) Administered  ? Influenza, High Dose Seasonal PF 01/04/2018, 11/18/2018  ? PFIZER(Purple Top)SARS-COV-2 Vaccination 04/05/2019, 04/26/2019, 12/13/2019  ? Zoster Recombinat (Shingrix) 06/24/2017, 08/26/2017  ?  ? ?Objective: ?Vital Signs: There were no vitals taken for this visit.  ? ?Physical Exam  ? ?Musculoskeletal Exam: *** ? ?CDAI Exam: ?CDAI Score: -- ?Patient Global: --; Provider Global: -- ?Swollen: --; Tender: -- ?Joint Exam 06/18/2021  ? ?No joint exam has been documented for this visit  ? ?There is currently no information documented on the homunculus. Go to the Rheumatology activity and complete the homunculus joint exam. ? ?Investigation: ?No additional findings. ? ?Imaging: ?MM 3D SCREEN BREAST BILATERAL ? ?Result Date: 05/16/2021 ?CLINICAL DATA:  Screening. EXAM: DIGITAL SCREENING BILATERAL MAMMOGRAM WITH TOMOSYNTHESIS AND CAD TECHNIQUE: Bilateral screening digital craniocaudal and mediolateral oblique mammograms were obtained. Bilateral screening digital breast tomosynthesis was performed. The images were evaluated with computer-aided detection. COMPARISON:  Previous exam(s). ACR Breast Density Category b: There are scattered areas of fibroglandular density. FINDINGS: There are no findings suspicious for malignancy. IMPRESSION: No mammographic evidence of malignancy. A result letter of this screening mammogram will be mailed directly to the patient. RECOMMENDATION: Screening mammogram in one year. (Code:SM-B-01Y) BI-RADS CATEGORY  1: Negative. Electronically Signed   By: Margarette Canada M.D.   On: 05/16/2021 13:15   ? ?Recent Labs: ?  Lab Results  ?Component Value Date  ? WBC 6.7 04/15/2021  ? HGB 14.1 04/15/2021  ? PLT 292 04/15/2021  ? NA 136 04/15/2021  ? K 4.6 04/15/2021  ? CL 101 04/15/2021  ? CO2 26 04/15/2021  ? GLUCOSE 108 (H) 04/15/2021  ? BUN 25 04/15/2021  ? CREATININE  1.11 (H) 04/15/2021  ? BILITOT 0.5 04/15/2021  ? ALKPHOS 72 10/12/2018  ? AST 18 04/15/2021  ? ALT 17 04/15/2021  ? PROT 7.8 04/15/2021  ? ALBUMIN 3.6 10/12/2018  ? CALCIUM 9.6 04/15/2021  ? GFRAA 59 (L) 06/28/2020  ? QFTBGOLD Negative 07/22/2016  ? QFTBGOLDPLUS NEGATIVE 04/15/2021  ? ? ?Speciality Comments: Inadequate response to Stelara, and Enbrel, methotrexate caused elevated creatinine ? ?Procedures:  ?No procedures performed ?Allergies: Patient has no known allergies.  ? ?Assessment / Plan:     ?Visit Diagnoses: No diagnosis found. ? ?Orders: ?No orders of the defined types were placed in this encounter. ? ?No orders of the defined types were placed in this encounter. ? ? ?Face-to-face time spent with patient was *** minutes. Greater than 50% of time was spent in counseling and coordination of care. ? ?Follow-Up Instructions: No follow-ups on file. ? ? ?Earnestine Mealing, CMA ? ?Note - This record has been created using [employer].  ?Chart creation errors have been sought, but may not always  ?have been located. Such creation errors do not reflect on  ?the standard of medical care.  ?

## 2021-06-04 NOTE — Telephone Encounter (Signed)
Next Visit: 06/18/2021 ? ?Last Visit: 04/15/2021 ? ?Last Fill: 11/07/2021 ? ?GE:FUWTKTCCE arthritis  ? ?Current Dose per office note 04/15/2021: Cosentyx 150 mg sq q 2 weeks ? ?Labs: 04/15/2021 CBC WNL.  Creatinine is slightly elevated but continues to trend up.  GFR is low at 53. Glucose is 108.   Rest of CMP WNL.   ? ?TB Gold: 04/15/2021 Neg   ? ?Okay to refill Cosentyx?  ?

## 2021-06-14 ENCOUNTER — Ambulatory Visit: Payer: Federal, State, Local not specified - PPO | Admitting: Rheumatology

## 2021-06-18 ENCOUNTER — Ambulatory Visit: Payer: Federal, State, Local not specified - PPO | Admitting: Rheumatology

## 2021-06-18 DIAGNOSIS — M461 Sacroiliitis, not elsewhere classified: Secondary | ICD-10-CM

## 2021-06-18 DIAGNOSIS — M7632 Iliotibial band syndrome, left leg: Secondary | ICD-10-CM

## 2021-06-18 DIAGNOSIS — M545 Low back pain, unspecified: Secondary | ICD-10-CM

## 2021-06-18 DIAGNOSIS — L920 Granuloma annulare: Secondary | ICD-10-CM

## 2021-06-18 DIAGNOSIS — Z8679 Personal history of other diseases of the circulatory system: Secondary | ICD-10-CM

## 2021-06-18 DIAGNOSIS — L4050 Arthropathic psoriasis, unspecified: Secondary | ICD-10-CM

## 2021-06-18 DIAGNOSIS — Z8659 Personal history of other mental and behavioral disorders: Secondary | ICD-10-CM

## 2021-06-18 DIAGNOSIS — L408 Other psoriasis: Secondary | ICD-10-CM

## 2021-06-18 DIAGNOSIS — Z79899 Other long term (current) drug therapy: Secondary | ICD-10-CM

## 2021-06-18 DIAGNOSIS — M8589 Other specified disorders of bone density and structure, multiple sites: Secondary | ICD-10-CM

## 2021-06-18 DIAGNOSIS — Z87448 Personal history of other diseases of urinary system: Secondary | ICD-10-CM

## 2021-06-18 DIAGNOSIS — M170 Bilateral primary osteoarthritis of knee: Secondary | ICD-10-CM

## 2021-06-18 DIAGNOSIS — E559 Vitamin D deficiency, unspecified: Secondary | ICD-10-CM

## 2021-06-18 DIAGNOSIS — Z8639 Personal history of other endocrine, nutritional and metabolic disease: Secondary | ICD-10-CM

## 2021-07-18 ENCOUNTER — Other Ambulatory Visit: Payer: Self-pay | Admitting: Family Medicine

## 2021-07-18 DIAGNOSIS — M8580 Other specified disorders of bone density and structure, unspecified site: Secondary | ICD-10-CM

## 2021-08-14 ENCOUNTER — Ambulatory Visit: Payer: Federal, State, Local not specified - PPO | Admitting: Dermatology

## 2021-08-14 DIAGNOSIS — L259 Unspecified contact dermatitis, unspecified cause: Secondary | ICD-10-CM | POA: Diagnosis not present

## 2021-08-14 MED ORDER — CLOBETASOL PROP EMOLLIENT BASE 0.05 % EX CREA: TOPICAL_CREAM | CUTANEOUS | 4 refills | Status: DC

## 2021-08-29 NOTE — Progress Notes (Signed)
Office Visit Note  Patient: Morgan Jensen             Date of Birth: [birth year]           MRN: [id number]             PCP: Glenis Smoker, MD Referring: Maurice Small, MD Visit Date: 09/12/2021 Occupation: '@GUAROCC'$ @  Subjective:  Medication management  History of Present Illness: Morgan Jensen is a 71 y.o. female with history of psoriatic arthritis and osteoarthritis.  She states that she has been taking Cosentyx 150 mg subcu every other week.  She has been tolerating Cosentyx without any side effects.  She has not had any episodes of inflammatory arthritis.  She has not had any psoriasis.  She has occasional discomfort in her right SI joint when she vacuums.  She has occasional discomfort in her knee joints but she has not noticed any swelling.  She denies history of Planter fasciitis, Achilles tendinitis or uveitis.  Activities of Daily Living:  Patient reports morning stiffness for 0 minutes.   Patient Denies nocturnal pain.  Difficulty dressing/grooming: Denies Difficulty climbing stairs: Denies Difficulty getting out of chair: Denies Difficulty using hands for taps, buttons, cutlery, and/or writing: Denies  Review of Systems  Constitutional:  Negative for fatigue.  HENT:  Negative for mouth sores, mouth dryness and nose dryness.   Eyes:  Positive for dryness. Negative for pain and itching.  Respiratory:  Negative for shortness of breath and difficulty breathing.   Cardiovascular:  Negative for chest pain and palpitations.  Gastrointestinal:  Negative for blood in stool, constipation and diarrhea.  Endocrine: Negative for increased urination.  Genitourinary:  Negative for difficulty urinating.  Musculoskeletal:  Negative for joint pain, joint pain, joint swelling, myalgias, morning stiffness, muscle tenderness and myalgias.  Skin:  Negative for color change, rash and redness.  Allergic/Immunologic: Negative for susceptible to infections.  Neurological:  Negative for  dizziness, numbness, headaches, memory loss and weakness.  Hematological:  Negative for bruising/bleeding tendency.  Psychiatric/Behavioral:  Negative for confusion.     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   Osteoarthritis of knee 01/18/2016   Hypertension 01/18/2016   Anxiety 01/18/2016   Elevated cholesterol 01/18/2016   CKD (chronic kidney disease) 01/18/2016   Psoriatic arthritis ([place]) 10/27/2015   Other psoriasis 10/27/2015    Past Medical History:  Diagnosis Date   Anxiety 01/18/2016   Arthritis    psoriatic arthritis    CKD (chronic kidney disease) 01/18/2016   Elevated cholesterol 01/18/2016   Hypertension 01/18/2016   Osteoarthritis of knee 01/18/2016   Osteopenia    per patient, PCP ordered DEXA    Psoriasis     Family History  Problem Relation Age of Onset   Aneurysm Mother    Cancer Father        Lung Cancer   Cancer Brother        Esopheageal Cancer   Past Surgical History:  Procedure Laterality Date   BREAST EXCISIONAL BIOPSY Right 1986   BREAST SURGERY Right 1985   Benign Tumor removed    LEG SURGERY Left 2004   Fibula and Tibia Rod placement    [place]   Social History   Social History Narrative   Not on file   Immunization History  Administered Date(s) Administered   Influenza, High Dose Seasonal PF 01/04/2018, 11/18/2018  PFIZER(Purple Top)SARS-COV-2 Vaccination 04/05/2019, 04/26/2019, 12/13/2019   Zoster Recombinat (Shingrix) 06/24/2017, 08/26/2017     Objective: Vital Signs: BP 116/68 (BP Location: Left Arm, Patient Position: Sitting, Cuff Size: Normal)   Pulse 67   Ht '5\' 2"'$  (1.575 m)   Wt 176 lb 9.6 oz (80.1 kg)   BMI 32.30 kg/m    Physical Exam Vitals and nursing note reviewed.  Constitutional:      Appearance: She is well-developed.  HENT:     Head: Normocephalic and atraumatic.  Eyes:      Conjunctiva/sclera: Conjunctivae normal.  Cardiovascular:     Rate and Rhythm: Normal rate and regular rhythm.     Heart sounds: Normal heart sounds.  Pulmonary:     Effort: Pulmonary effort is normal.     Breath sounds: Normal breath sounds.  Abdominal:     General: Bowel sounds are normal.     Palpations: Abdomen is soft.  Musculoskeletal:     Cervical back: Normal range of motion.  Lymphadenopathy:     Cervical: No cervical adenopathy.  Skin:    General: Skin is warm and dry.     Capillary Refill: Capillary refill takes less than 2 seconds.  Neurological:     Mental Status: She is alert and oriented to person, place, and time.  Psychiatric:        Behavior: Behavior normal.      Musculoskeletal Exam: She had good range of motion of the cervical thoracic and lumbar spine.  She had mild tenderness on palpation over right SI joint.  Shoulder joints, elbow joints, wrist joints with good range of motion.  She had bilateral PIP and DIP thickening with no synovitis.  Hip joints, knee joints, ankles with good range of motion.  She had bilateral bunions and PIP and DIP thickening of her feet.  CDAI Exam: CDAI Score: -- Patient Global: --; Provider Global: -- Swollen: --; Tender: -- Joint Exam 09/12/2021   No joint exam has been documented for this visit   There is currently no information documented on the homunculus. Go to the Rheumatology activity and complete the homunculus joint exam.  Investigation: No additional findings.  Imaging: No results found.  Recent Labs: Lab Results  Component Value Date   WBC 6.7 04/15/2021   HGB 14.1 04/15/2021   PLT 292 04/15/2021   NA 136 04/15/2021   K 4.6 04/15/2021   CL 101 04/15/2021   CO2 26 04/15/2021   GLUCOSE 108 (H) 04/15/2021   BUN 25 04/15/2021   CREATININE 1.11 (H) 04/15/2021   BILITOT 0.5 04/15/2021   ALKPHOS 72 10/12/2018   AST 18 04/15/2021   ALT 17 04/15/2021   PROT 7.8 04/15/2021   ALBUMIN 3.6 10/12/2018    CALCIUM 9.6 04/15/2021   GFRAA 59 (L) 06/28/2020   QFTBGOLD Negative 07/22/2016   QFTBGOLDPLUS NEGATIVE 04/15/2021    Speciality Comments: Inadequate response to Stelara, and Enbrel, methotrexate caused elevated creatinine  Procedures:  No procedures performed Allergies: Patient has no known allergies.   Assessment / Plan:     Visit Diagnoses: Psoriatic arthritis (HCC)-patient denies history of inflammatory arthritis.  She denies history of planter fasciitis, Achilles tendinitis or uveitis.  She has been tolerating Cosentyx 150 mg every other week without any side effects.  She had no interruption in treatment.  No synovitis or tendinitis was noted on the examination today.  Psoriasis-she had no active psoriasis lesions.  High risk medication use - Cosentyx 150 mg sq q 2 weeks-started on  01/05/19.  D/c Remicade-last infusion 11/23/18 due to low grade fevers. - Plan: CBC with Differential/Platelet, COMPLETE METABOLIC PANEL WITH GFR today and then every 3 months.  She was advised to hold Cosyntex if she develops an infection and resume after the infection resolves.  Information about immunization was placed in the AVS.  Sacroiliitis (HCC)-she had mild tenderness over the right SI joint.  She had occasional discomfort in the right SI joint when she is cleaning her house.  Primary osteoarthritis of both knees-she has off-and-on discomfort in her knee joints.  No warmth swelling or effusion was noted.  Osteopenia of multiple sites -  DEXA updated on 07/29/19: The BMD measured at Femur Neck Left is 0.809 g/cm2 with a T-score of -1.6.  DEXA ordered by Dr. Justin Mend.  Vitamin D deficiency-she is on vitamin D supplement.  Arthropathy of facet joint-she had good mobility in her lumbar spine without any discomfort.  History of hypercholesterolemia  History of chronic kidney disease-her GFR has been low and stable.  History of hypertension-blood pressure was normal today.  History of  anxiety  Orders: Orders Placed This Encounter  Procedures   CBC with Differential/Platelet   COMPLETE METABOLIC PANEL WITH GFR   No orders of the defined types were placed in this encounter.   Follow-Up Instructions: Return in about 5 months (around 02/12/2022) for Psoriatic arthritis.   Bo Merino, MD  Note - This record has been created using [employer].  Chart creation errors have been sought, but may not always  have been located. Such creation errors do not reflect on  the standard of medical care.

## 2021-09-02 ENCOUNTER — Encounter: Payer: Self-pay | Admitting: Dermatology

## 2021-09-12 ENCOUNTER — Ambulatory Visit: Payer: Federal, State, Local not specified - PPO | Admitting: Rheumatology

## 2021-09-12 ENCOUNTER — Encounter: Payer: Self-pay | Admitting: Rheumatology

## 2021-09-12 VITALS — BP 116/68 | HR 67 | Ht 62.0 in | Wt 176.6 lb

## 2021-09-12 DIAGNOSIS — Z8639 Personal history of other endocrine, nutritional and metabolic disease: Secondary | ICD-10-CM

## 2021-09-12 DIAGNOSIS — L409 Psoriasis, unspecified: Secondary | ICD-10-CM

## 2021-09-12 DIAGNOSIS — M461 Sacroiliitis, not elsewhere classified: Secondary | ICD-10-CM | POA: Diagnosis not present

## 2021-09-12 DIAGNOSIS — M7632 Iliotibial band syndrome, left leg: Secondary | ICD-10-CM

## 2021-09-12 DIAGNOSIS — Z8659 Personal history of other mental and behavioral disorders: Secondary | ICD-10-CM

## 2021-09-12 DIAGNOSIS — L4050 Arthropathic psoriasis, unspecified: Secondary | ICD-10-CM

## 2021-09-12 DIAGNOSIS — Z87448 Personal history of other diseases of urinary system: Secondary | ICD-10-CM

## 2021-09-12 DIAGNOSIS — Z79899 Other long term (current) drug therapy: Secondary | ICD-10-CM | POA: Diagnosis not present

## 2021-09-12 DIAGNOSIS — M170 Bilateral primary osteoarthritis of knee: Secondary | ICD-10-CM

## 2021-09-12 DIAGNOSIS — M47819 Spondylosis without myelopathy or radiculopathy, site unspecified: Secondary | ICD-10-CM

## 2021-09-12 DIAGNOSIS — Z8679 Personal history of other diseases of the circulatory system: Secondary | ICD-10-CM

## 2021-09-12 DIAGNOSIS — E559 Vitamin D deficiency, unspecified: Secondary | ICD-10-CM

## 2021-09-12 DIAGNOSIS — M8589 Other specified disorders of bone density and structure, multiple sites: Secondary | ICD-10-CM

## 2021-09-12 NOTE — Patient Instructions (Signed)
Standing Labs We placed an order today for your standing lab work.   Please have your standing labs drawn in October and every 3 months  If possible, please have your labs drawn 2 weeks prior to your appointment so that the provider can discuss your results at your appointment.  Please note that you may see your imaging and lab results in MyChart before we have reviewed them. We may be awaiting multiple results to interpret others before contacting you. Please allow our office up to 72 hours to thoroughly review all of the results before contacting the office for clarification of your results.  We have open lab daily: Monday through Thursday from 1:30-4:30 PM and Friday from 1:30-4:00 PM at the office of Dr. Thaddaeus Granja,  Rheumatology.   Please be advised, all patients with office appointments requiring lab work will take precedent over walk-in lab work.  If possible, please come for your lab work on Monday and Friday afternoons, as you may experience shorter wait times. The office is located at [address], [address], [place], [place] [address] No appointment is necessary.   Labs are drawn by [facility]. Please bring your co-pay at the time of your lab draw.  You may receive a bill from [facility] for your lab work.  Please note if you are on Hydroxychloroquine and and an order has been placed for a Hydroxychloroquine level, you will need to have it drawn 4 hours or more after your last dose.  If you wish to have your labs drawn at another location, please call the office 24 hours in advance to send orders.  If you have any questions regarding directions or hours of operation,  please call [phone].   As a reminder, please drink plenty of water prior to coming for your lab work. Thanks!   Vaccines You are taking a medication(s) that can suppress your immune system.  The following immunizations are recommended: Flu annually Covid-19  Td/Tdap (tetanus, diphtheria,  pertussis) every 10 years Pneumonia (Prevnar 15 then Pneumovax 23 at least 1 year apart.  Alternatively, can take Prevnar 20 without needing additional dose) Shingrix: 2 doses from 4 weeks to 6 months apart  Please check with your PCP to make sure you are up to date.   If you have signs or symptoms of an infection or start antibiotics: First, call your PCP for workup of your infection. Hold your medication through the infection, until you complete your antibiotics, and until symptoms resolve if you take the following: Injectable medication (Actemra, Benlysta, Cimzia, Cosentyx, Enbrel, Humira, Kevzara, Orencia, Remicade, Simponi, Stelara, Taltz, Tremfya) Methotrexate Leflunomide (Arava) Mycophenolate (Cellcept) Xeljanz, Olumiant, or Rinvoq  

## 2021-09-13 LAB — COMPLETE METABOLIC PANEL WITH GFR
AG Ratio: 1.4 (calc) (ref 1.0–2.5)
ALT: 18 U/L (ref 6–29)
AST: 19 U/L (ref 10–35)
Albumin: 4.2 g/dL (ref 3.6–5.1)
Alkaline phosphatase (APISO): 66 U/L (ref 37–153)
BUN/Creatinine Ratio: 16 (calc) (ref 6–22)
BUN: 18 mg/dL (ref 7–25)
CO2: 24 mmol/L (ref 20–32)
Calcium: 9.6 mg/dL (ref 8.6–10.4)
Chloride: 103 mmol/L (ref 98–110)
Creat: 1.15 mg/dL — ABNORMAL HIGH (ref 0.60–1.00)
Globulin: 3.1 g/dL (calc) (ref 1.9–3.7)
Glucose, Bld: 139 mg/dL — ABNORMAL HIGH (ref 65–99)
Potassium: 4.5 mmol/L (ref 3.5–5.3)
Sodium: 136 mmol/L (ref 135–146)
Total Bilirubin: 0.4 mg/dL (ref 0.2–1.2)
Total Protein: 7.3 g/dL (ref 6.1–8.1)
eGFR: 51 mL/min/{1.73_m2} — ABNORMAL LOW (ref >=60)

## 2021-09-13 LAB — CBC WITH DIFFERENTIAL/PLATELET
Absolute Monocytes: 663 cells/uL (ref 200–950)
Basophils Absolute: 78 cells/uL (ref 0–200)
Basophils Relative: 1.2 %
Eosinophils Absolute: 215 cells/uL (ref 15–500)
Eosinophils Relative: 3.3 %
HCT: 39.7 % (ref 35.0–45.0)
Hemoglobin: 13.5 g/dL (ref 11.7–15.5)
Lymphs Abs: 2438 cells/uL (ref 850–3900)
MCH: 29.9 pg (ref 27.0–33.0)
MCHC: 34 g/dL (ref 32.0–36.0)
MCV: 87.8 fL (ref 80.0–100.0)
MPV: 11.5 fL (ref 7.5–12.5)
Monocytes Relative: 10.2 %
Neutro Abs: 3107 cells/uL (ref 1500–7800)
Neutrophils Relative %: 47.8 %
Platelets: 258 10*3/uL (ref 140–400)
RBC: 4.52 10*6/uL (ref 3.80–5.10)
RDW: 12.3 % (ref 11.0–15.0)
Total Lymphocyte: 37.5 %
WBC: 6.5 10*3/uL (ref 3.8–10.8)

## 2021-09-13 NOTE — Progress Notes (Signed)
CBC is normal.  Creatinine is elevated.  Glucose is elevated probably not a fasting sample.  Please forward results to her PCP.

## 2021-11-06 ENCOUNTER — Encounter: Payer: Self-pay | Admitting: Dermatology

## 2021-12-26 ENCOUNTER — Other Ambulatory Visit: Payer: Self-pay | Admitting: *Deleted

## 2021-12-26 DIAGNOSIS — Z79899 Other long term (current) drug therapy: Secondary | ICD-10-CM

## 2021-12-26 MED ORDER — COSENTYX SENSOREADY (300 MG) 150 MG/ML ~~LOC~~ SOAJ
150.0000 mg | SUBCUTANEOUS | 0 refills | Status: DC
Start: 2021-12-26 — End: 2022-01-07

## 2021-12-26 NOTE — Telephone Encounter (Signed)
Next Visit: 02/21/2022  Last Visit: 09/12/2021  Last Fill: 06/04/2021  HB:ZJIRCVELF arthritis   Current Dose per office note 09/12/2021: Cosentyx 150 mg sq q 2 weeks  Labs: 09/12/2021 CBC is normal.  Creatinine is elevated.  Glucose is elevated probably not a fasting sample.  TB Gold: 04/15/2021   Okay to refill Cosentyx?

## 2021-12-27 LAB — CBC WITH DIFFERENTIAL/PLATELET
Absolute Monocytes: 637 {cells}/uL (ref 200–950)
Basophils Absolute: 49 {cells}/uL (ref 0–200)
Basophils Relative: 0.7 %
Eosinophils Absolute: 161 {cells}/uL (ref 15–500)
Eosinophils Relative: 2.3 %
HCT: 41.2 % (ref 35.0–45.0)
Hemoglobin: 14 g/dL (ref 11.7–15.5)
Lymphs Abs: 2499 {cells}/uL (ref 850–3900)
MCH: 30 pg (ref 27.0–33.0)
MCHC: 34 g/dL (ref 32.0–36.0)
MCV: 88.4 fL (ref 80.0–100.0)
MPV: 11.3 fL (ref 7.5–12.5)
Monocytes Relative: 9.1 %
Neutro Abs: 3654 {cells}/uL (ref 1500–7800)
Neutrophils Relative %: 52.2 %
Platelets: 275 Thousand/uL (ref 140–400)
RBC: 4.66 Million/uL (ref 3.80–5.10)
RDW: 12.4 % (ref 11.0–15.0)
Total Lymphocyte: 35.7 %
WBC: 7 Thousand/uL (ref 3.8–10.8)

## 2021-12-27 LAB — COMPLETE METABOLIC PANEL WITHOUT GFR
AG Ratio: 1.2 (calc) (ref 1.0–2.5)
ALT: 15 U/L (ref 6–29)
AST: 19 U/L (ref 10–35)
Albumin: 4.2 g/dL (ref 3.6–5.1)
Alkaline phosphatase (APISO): 75 U/L (ref 37–153)
BUN/Creatinine Ratio: 15 (calc) (ref 6–22)
BUN: 17 mg/dL (ref 7–25)
CO2: 28 mmol/L (ref 20–32)
Calcium: 9.7 mg/dL (ref 8.6–10.4)
Chloride: 102 mmol/L (ref 98–110)
Creat: 1.11 mg/dL — ABNORMAL HIGH (ref 0.60–1.00)
Globulin: 3.5 g/dL (ref 1.9–3.7)
Glucose, Bld: 133 mg/dL — ABNORMAL HIGH (ref 65–99)
Potassium: 5 mmol/L (ref 3.5–5.3)
Sodium: 137 mmol/L (ref 135–146)
Total Bilirubin: 0.4 mg/dL (ref 0.2–1.2)
Total Protein: 7.7 g/dL (ref 6.1–8.1)
eGFR: 53 mL/min/1.73m2 — ABNORMAL LOW

## 2021-12-27 NOTE — Progress Notes (Signed)
Glucose is mildly elevated, creatinine is mildly elevated and stable.  CBC is normal.

## 2022-01-02 IMAGING — DX DG KNEE COMPLETE 4+V*R*
4 series · 4 of 4 positions shown · non-contrast
Comparison: 12/29/2018

CLINICAL DATA: Right knee injury yesterday with popping sensation
laterally, initial encounter

EXAM:
RIGHT KNEE - COMPLETE 4+ VIEW

[knee ap]
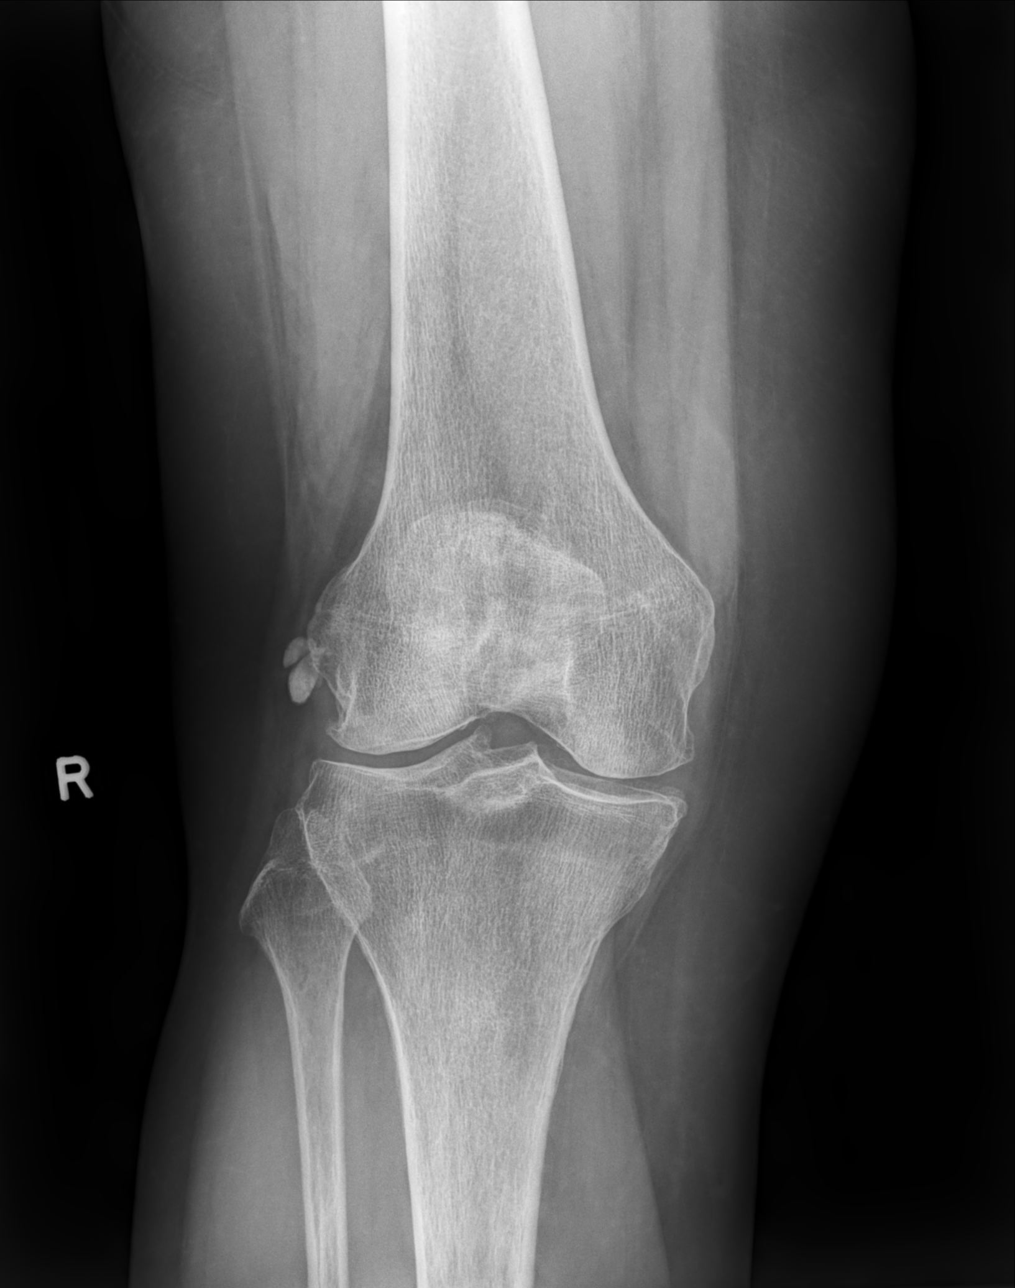

[knee mlo (1 of 2)]
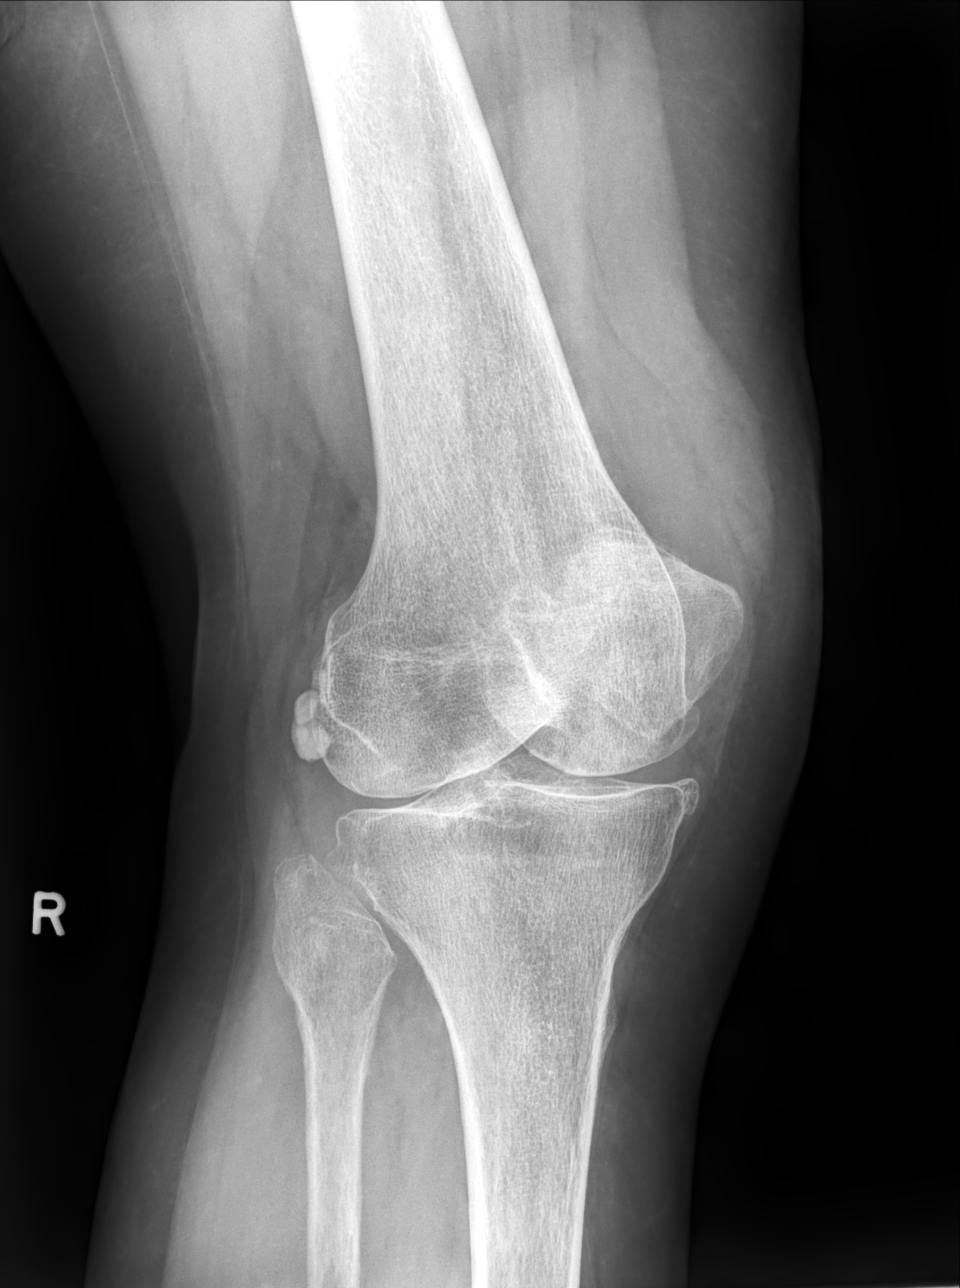

[knee mlo (2 of 2)]
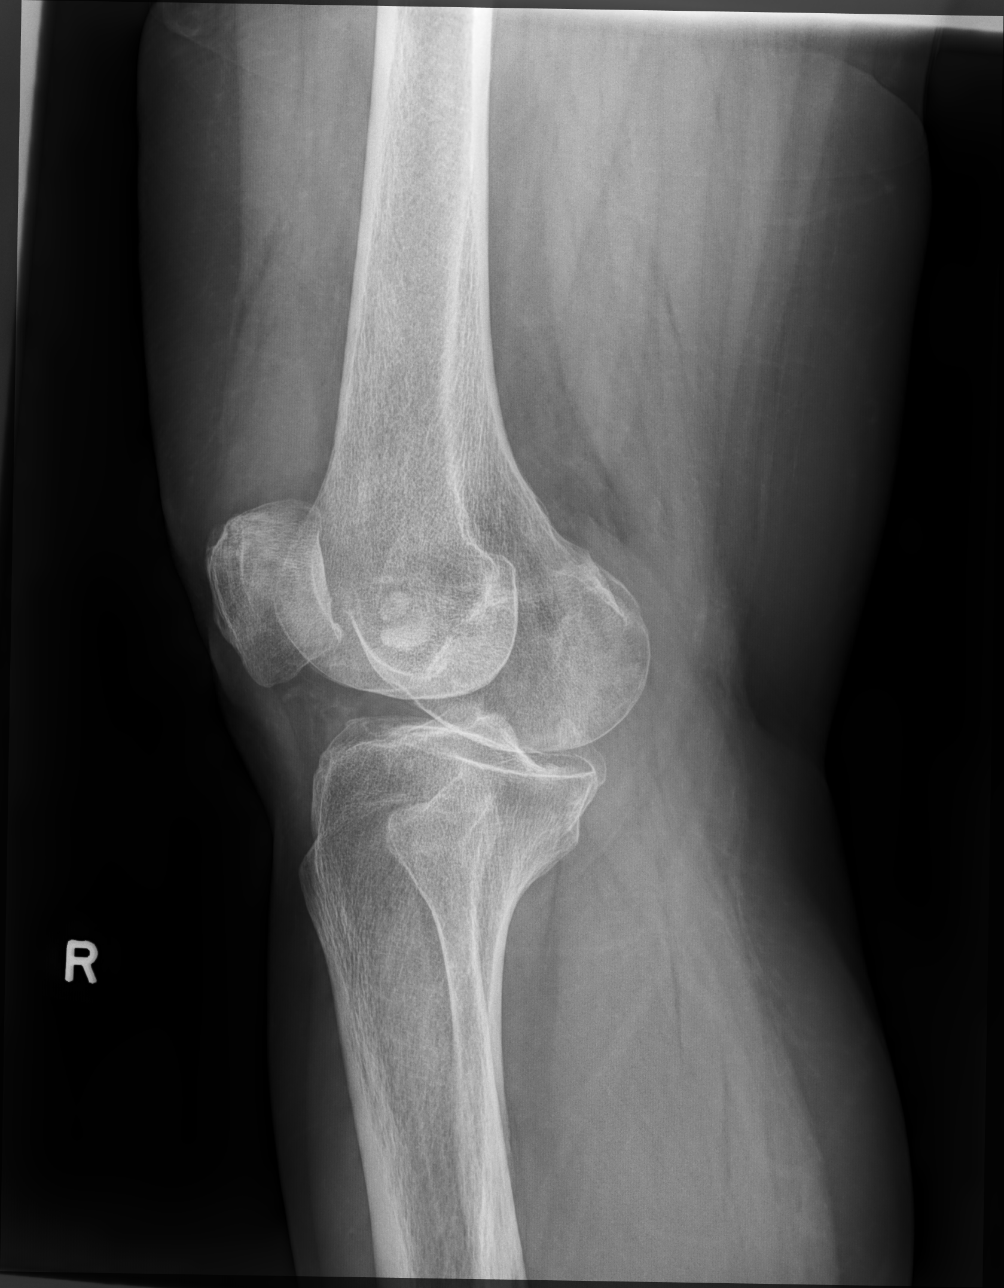

[knee lat]
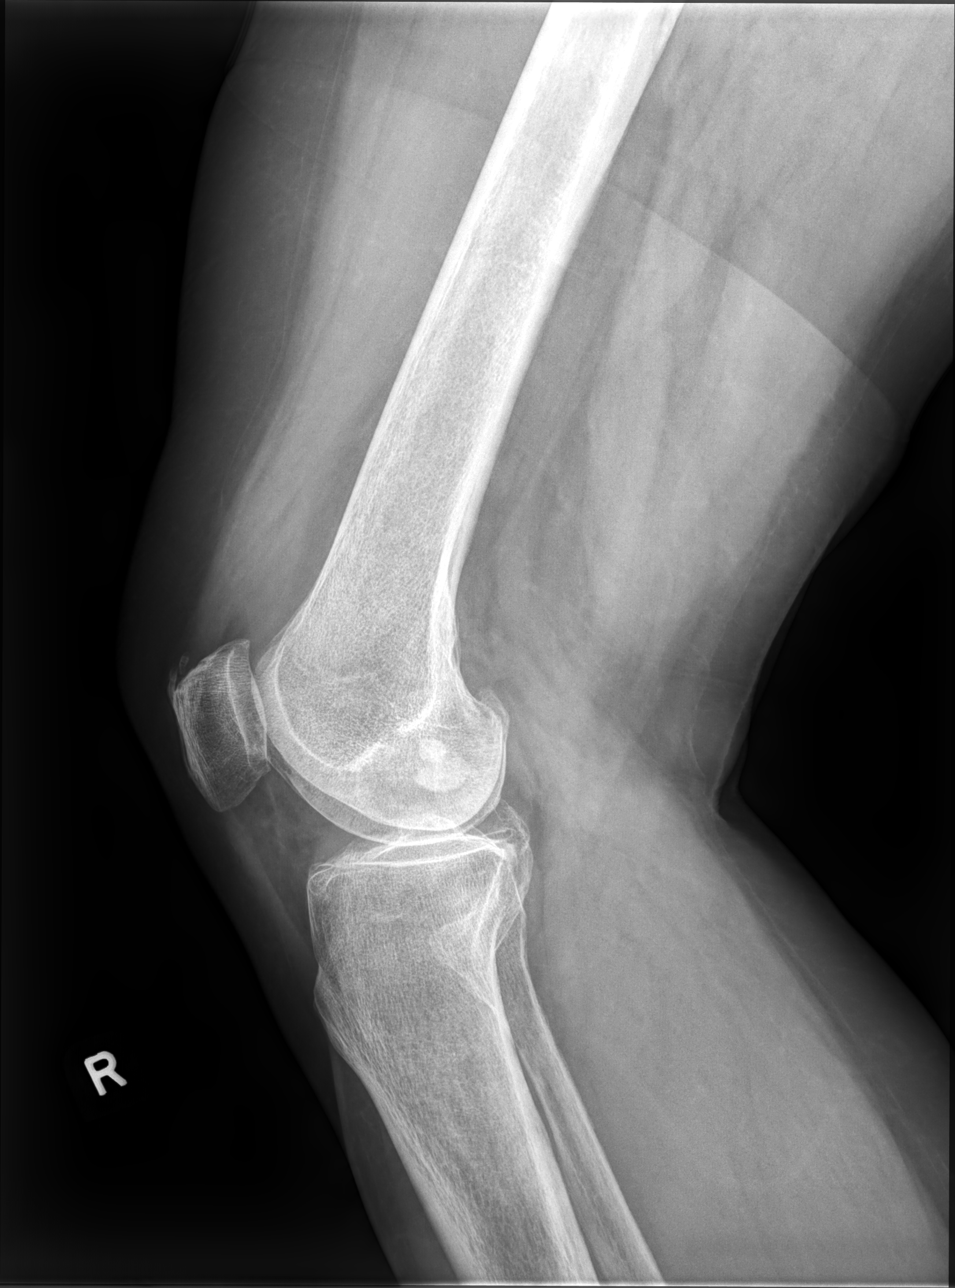

[4 of 4 positions shown; findings below may reference images not displayed]

FINDINGS: Tricompartmental degenerative changes are noted. No acute fracture
or dislocation is seen. Minimal joint effusion is noted.
Calcifications are noted along the posterolateral aspect of the knee
joint and are stable from the prior exam. No other focal abnormality
is noted.
IMPRESSION: Degenerative change without acute abnormality.

## 2022-01-07 ENCOUNTER — Other Ambulatory Visit: Payer: Self-pay | Admitting: Physician Assistant

## 2022-01-07 NOTE — Telephone Encounter (Signed)
Next Visit: 02/21/2022  Last Visit: 09/12/2021  Last Fill: 12/26/2021 (30 day supply)  DX: Psoriatic arthritis   Current Dose per office note 09/12/2021: Cosentyx 150 mg sq q 2 weeks  Labs: 12/26/2021 Glucose is mildly elevated, creatinine is mildly elevated and stable.  CBC is normal.  TB Gold: 04/15/2021  Neg   Okay to refill Cosentyx?

## 2022-02-07 ENCOUNTER — Ambulatory Visit
Admission: RE | Admit: 2022-02-07 | Discharge: 2022-02-07 | Disposition: A | Discharge status: Home / Self Care | Payer: Federal, State, Local not specified - PPO | Source: Ambulatory Visit | Attending: Family Medicine | Admitting: Family Medicine

## 2022-02-07 DIAGNOSIS — M8580 Other specified disorders of bone density and structure, unspecified site: Secondary | ICD-10-CM

## 2022-02-07 NOTE — Progress Notes (Signed)
Office Visit Note  Patient: Morgan Jensen             Date of Birth: 1951/01/24           MRN: [id number]             PCP: Glenis Smoker, MD Referring: Glenis Smoker, * Visit Date: 02/21/2022 Occupation: '@GUAROCC'$ @  Subjective:  Medication monitoring   History of Present Illness: OBELIA BONELLO is a 71 y.o. female with history of psoriatic arthritis and osteoarthritis.  She is currently on Cosentyx 150 mg sq q 2 weeks-started on 01/05/19.  She continues to tolerate Cosentyx without any side effects or injection site reactions.  She has not had any recent psoriatic arthritis flares.  She denies any joint pain or joint swelling at this time. She denies any achilles tendonitis or plantar fasciitis.  She denies any active psoriasis.  She denies any new medical conditions.  She denies any recent or recurrent infections.  She has received the annual flu shot and is up to date with the shingrix vaccines.  Patient had an updated DEXA on 02/07/2022, which was consistent with osteopenia.  She has been taking calcium and vitamin D supplements daily.  She denies any recent falls or fractures.    Activities of Daily Living:  Patient reports morning stiffness for 0 minutes  Patient Denies nocturnal pain.  Difficulty dressing/grooming: Denies Difficulty climbing stairs: Denies Difficulty getting out of chair: Denies Difficulty using hands for taps, buttons, cutlery, and/or writing: Denies  Review of Systems  Constitutional:  Negative for fatigue.  HENT:  Negative for mouth sores and mouth dryness.   Eyes:  Positive for dryness.  Respiratory:  Negative for shortness of breath.   Cardiovascular:  Negative for chest pain and palpitations.  Gastrointestinal:  Negative for blood in stool, constipation and diarrhea.  Endocrine: Negative for increased urination.  Genitourinary:  Negative for involuntary urination.  Musculoskeletal:  Negative for joint pain, gait problem, joint pain,  joint swelling, myalgias, muscle weakness, morning stiffness, muscle tenderness and myalgias.  Skin:  Negative for color change, rash, hair loss and sensitivity to sunlight.  Allergic/Immunologic: Negative for susceptible to infections.  Neurological:  Negative for dizziness and headaches.  Hematological:  Negative for swollen glands.  Psychiatric/Behavioral:  Negative for depressed mood and sleep disturbance. The patient is not nervous/anxious.     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   Osteoarthritis of knee 01/18/2016   Hypertension 01/18/2016   Anxiety 01/18/2016   Elevated cholesterol 01/18/2016   CKD (chronic kidney disease) 01/18/2016   Psoriatic arthritis ([place]) 10/27/2015   Other psoriasis 10/27/2015    Past Medical History:  Diagnosis Date   Anxiety 01/18/2016   Arthritis    psoriatic arthritis    CKD (chronic kidney disease) 01/18/2016   Elevated cholesterol 01/18/2016   Hypertension 01/18/2016   Osteoarthritis of knee 01/18/2016   Osteopenia    per patient, PCP ordered DEXA    Psoriasis     Family History  Problem Relation Age of Onset   Aneurysm Mother    Cancer Father        Lung Cancer   Cancer Brother        Esopheageal Cancer   Past Surgical History:  Procedure Laterality Date   BREAST EXCISIONAL BIOPSY Right 1986   BREAST SURGERY Right 1985   Benign Tumor removed  LEG SURGERY Left 2004   Fibula and Tibia Rod placement    TUBAL LIGATION  1993   Social History   Social History Narrative   Not on file   Immunization History  Administered Date(s) Administered   Influenza, High Dose Seasonal PF 01/04/2018, 11/18/2018   PFIZER(Purple Top)SARS-COV-2 Vaccination 04/05/2019, 04/26/2019, 12/13/2019   Zoster Recombinat (Shingrix) 06/24/2017, 08/26/2017     Objective: Vital Signs: BP 123/81 (BP Location: Left Arm, Patient Position: Sitting, Cuff  Size: Large)   Pulse 73   Resp 16   Ht '5\' 2"'$  (1.575 m)   Wt 178 lb (80.7 kg)   BMI 32.56 kg/m    Physical Exam Vitals and nursing note reviewed.  Constitutional:      Appearance: She is well-developed.  HENT:     Head: Normocephalic and atraumatic.  Eyes:     Conjunctiva/sclera: Conjunctivae normal.  Cardiovascular:     Rate and Rhythm: Normal rate and regular rhythm.     Heart sounds: Normal heart sounds.  Pulmonary:     Effort: Pulmonary effort is normal.     Breath sounds: Normal breath sounds.  Abdominal:     General: Bowel sounds are normal.     Palpations: Abdomen is soft.  Musculoskeletal:     Cervical back: Normal range of motion.  Skin:    General: Skin is warm and dry.     Capillary Refill: Capillary refill takes less than 2 seconds.  Neurological:     Mental Status: She is alert and oriented to person, place, and time.  Psychiatric:        Behavior: Behavior normal.      Musculoskeletal Exam: C-spine has limited range of motion with lateral rotation.  Thoracic spine and lumbar spine have good range of motion.  No midline spinal tenderness or SI joint tenderness.  Shoulder joints, elbow joints, wrist joints, MCPs, PIPs, DIPs have good range of motion with no synovitis.  Mild DIP prominence noted.  Complete fist formation bilaterally.  Hip joints have good range of motion with no groin pain.  Knee joints have good range of motion.  Mild warmth of the left knee but no effusion noted.  Ankle joints have good range of motion with no tenderness or synovitis.  No evidence of Achilles tendinitis or plantar fasciitis.  CDAI Exam: CDAI Score: -- Patient Global: --; Provider Global: -- Swollen: --; Tender: -- Joint Exam 02/21/2022   No joint exam has been documented for this visit   There is currently no information documented on the homunculus. Go to the Rheumatology activity and complete the homunculus joint exam.  Investigation: No additional  findings.  Imaging: DG BONE DENSITY (DXA)  Result Date: 02/07/2022 EXAM: DUAL X-RAY ABSORPTIOMETRY (DXA) FOR BONE MINERAL DENSITY IMPRESSION: Referring Physician:  Saintclair Halsted Your patient completed a bone mineral density test using GE Lunar iDXA system (analysis version: 16). Technologist: sec PATIENT: Name: Ziaire, Bieser Patient ID: [id number] Birth Date: [birth year] Height: 61.5 in. Sex: Female Measured: 02/07/2022 Weight: 177.0 lbs. Indications: Advanced Age, Caucasian, Estrogen Deficient, Postmenopausal Fractures: Left Tib-Fib Treatments: Calcium (E943.0), Vitamin D (E933.5) ASSESSMENT: The BMD measured at Femur Neck Left is 0.781 g/cm2 with a T-score of -1.9. This patient is considered osteopenic/low bone mass according to [facility]) criteria. The quality of the exam is good. L3,L4 was excluded due to degenerative changes. Site Region Measured Date Measured Age YA BMD Significant CHANGE T-score AP Spine L1-L2 02/07/2022 72.7 -1.9 0.936 g/cm2 * AP  Spine L1-L2 07/29/2019 70.2 -1.3 1.007 g/cm2 DualFemur Neck Left 02/07/2022 72.7 -1.9 0.781 g/cm2 DualFemur Neck Left 07/29/2019 70.2 -1.6 0.809 g/cm2 DualFemur Total Mean 02/07/2022 72.7 -1.1 0.869 g/cm2 DualFemur Total Mean 07/29/2019 70.2 -1.3 0.849 g/cm2 Left Forearm Radius 33% 02/07/2022 72.7 -1.5 0.745 g/cm2 World Health Organization [facility]) criteria for post-menopausal, Caucasian Women: Normal       T-score at or above -1 SD Osteopenia   T-score between -1 and -2.5 SD Osteoporosis T-score at or below -2.5 SD RECOMMENDATION: 1. All patients should optimize calcium and vitamin D intake. 2. Consider FDA-approved medical therapies in postmenopausal women and men aged 8 years and older, based on the following: a. A hip or vertebral (clinical or morphometric) fracture. b. T-score = -2.5 at the femoral neck or spine after appropriate evaluation to exclude secondary causes. c. Low bone mass (T-score between -1.0 and -2.5 at the femoral neck  or spine) and a 10-year probability of a hip fracture = 3% or a 10-year probability of a major osteoporosis-related fracture = 20% based on the [place]-adapted WHO algorithm. d. Clinician judgment and/or patient preferences may indicate treatment for people with 10-year fracture probabilities above or below these levels. FOLLOW-UP: Patients with diagnosis of osteoporosis or at high risk for fracture should have regular bone mineral density tests. Patients eligible for Medicare are allowed routine testing every 2 years. The testing frequency can be increased to one year for patients who have rapidly progressing disease, are receiving or discontinuing medical therapy to restore bone mass, or have additional risk factors. I have reviewed this study and agree with the findings. [facility] FRAX* 10-year Probability of Fracture Based on femoral neck BMD: DualFemur (Left) Major Osteoporotic Fracture: 11.3% Hip Fracture:                2.3% Population:                  [place] (Caucasian) Risk Factors:                None *FRAX is a Materials engineer of the [employer] for Metabolic Bone Disease, a World Pharmacologist (WHO) [facility]. ASSESSMENT: The probability of a major osteoporotic fracture is 11.3% within the next ten years. The probability of a hip fracture is 2.3% within the next ten years. Electronically Signed   By: Ammie Ferrier M.D.   On: 02/07/2022 10:49    Recent Labs: Lab Results  Component Value Date   WBC 7.0 12/26/2021   HGB 14.0 12/26/2021   PLT 275 12/26/2021   NA 137 12/26/2021   K 5.0 12/26/2021   CL 102 12/26/2021   CO2 28 12/26/2021   GLUCOSE 133 (H) 12/26/2021   BUN 17 12/26/2021   CREATININE 1.11 (H) 12/26/2021   BILITOT 0.4 12/26/2021   ALKPHOS 72 10/12/2018   AST 19 12/26/2021   ALT 15 12/26/2021   PROT 7.7 12/26/2021   ALBUMIN 3.6 10/12/2018   CALCIUM 9.7 12/26/2021   GFRAA 59 (L) 06/28/2020   QFTBGOLD Negative  07/22/2016   QFTBGOLDPLUS NEGATIVE 04/15/2021    Speciality Comments: Inadequate response to Stelara, and Enbrel, methotrexate caused elevated creatinine  Procedures:  No procedures performed Allergies: Patient has no known allergies.   Assessment / Plan:     Visit Diagnoses: Psoriatic arthritis ([place]) - She has no synovitis or dactylitis on examination today.  She has not had any signs or symptoms of a psoriatic arthritis flare.  No evidence of  Achilles tendinitis or plantar fasciitis.  No signs or symptoms of uveitis.  No SI joint tenderness upon palpation.  No active psoriasis at this time.  She has clinically been doing well on Cosentyx 150 mg subcutaneous injections every 14 days.  She is tolerating Cosentyx without any side effects or injection site reactions.  She will remain on Cosentyx as monotherapy.  She was advised to notify us if she develops signs or symptoms of a flare.  She will follow-up in the office in 5 months or sooner if needed.  Psoriasis: She has no active psoriasis at this time.   High risk medication use - Cosentyx 150 mg sq q 2 weeks-started on 01/05/19.  D/c Remicade-last infusion 11/23/18 due to low grade fevers.  CBC and CMP updated on 12/26/21. Her next lab work will be due in January and every 3 months.  Standing orders for CBC and CMP remain in place.  TB gold negative on 04/15/21.  Future order for TB gold placed today.  She has not had any recent or recurrent infections.  Discussed the importance of holding cosentyx if she develops signs or symptoms of an infection and to resume once the infection has completely cleared.  - Plan: QuantiFERON-TB Gold Plus  Screening for tuberculosis -Future order for TB Gold placed today.  Plan: QuantiFERON-TB Gold Plus  Sacroiliitis ([place]): She has no SI joint tenderness upon palpation.  No nocturnal pain.  Primary osteoarthritis of both knees: She has good range of motion of both knee joints on examination today.  Mild warmth  of the left knee but no effusion noted.  She has not been experiencing any difficulty climbing steps or rising from a seated position.  Osteopenia of multiple sites - DEXA updated on 07/29/19: The BMD measured at Femur Neck Left is 0.809 g/cm2 with a T-score of -1.6.  DEXA ordered by Dr. Justin Mend.  DEXA updated on 02/07/2022:The BMD measured at Femur Neck Left is 0.781 g/cm2 with a T-score of -1.9.  She is taking a calcium and vitamin D supplement daily.  No recent falls or fractures.  Vitamin D deficiency: She is taking a calcium and vitamin D supplement daily.  Arthropathy of facet joint: X-rays of the lumbar spine were updated on 04/15/2021 which were consistent with mild degenerative changes and facet joint arthropathy.  She has mild midline spinal tenderness in the lumbar region.  No symptoms of radiculopathy.  She has not had any nocturnal pain.  Her symptoms are exacerbated by vacuuming.  Other medical conditions are listed as follows:  History of chronic kidney disease  History of hypercholesterolemia  History of hypertension: Blood pressure was 123/81 today in the office.  History of anxiety    Orders: Orders Placed This Encounter  Procedures   QuantiFERON-TB Gold Plus   No orders of the defined types were placed in this encounter.   Follow-Up Instructions: Return in about 5 months (around 07/23/2022) for Psoriatic arthritis, Osteoarthritis.   Ofilia Neas, PA-C  Note - This record has been created using Dragon software.  Chart creation errors have been sought, but may not always  have been located. Such creation errors do not reflect on  the standard of medical care.

## 2022-02-21 ENCOUNTER — Ambulatory Visit: Payer: Federal, State, Local not specified - PPO | Attending: Physician Assistant | Admitting: Physician Assistant

## 2022-02-21 ENCOUNTER — Encounter: Payer: Self-pay | Admitting: Physician Assistant

## 2022-02-21 VITALS — BP 123/81 | HR 73 | Resp 16 | Ht 62.0 in | Wt 178.0 lb

## 2022-02-21 DIAGNOSIS — M461 Sacroiliitis, not elsewhere classified: Secondary | ICD-10-CM | POA: Diagnosis not present

## 2022-02-21 DIAGNOSIS — M47819 Spondylosis without myelopathy or radiculopathy, site unspecified: Secondary | ICD-10-CM

## 2022-02-21 DIAGNOSIS — Z79899 Other long term (current) drug therapy: Secondary | ICD-10-CM

## 2022-02-21 DIAGNOSIS — E559 Vitamin D deficiency, unspecified: Secondary | ICD-10-CM

## 2022-02-21 DIAGNOSIS — L409 Psoriasis, unspecified: Secondary | ICD-10-CM | POA: Diagnosis not present

## 2022-02-21 DIAGNOSIS — M8589 Other specified disorders of bone density and structure, multiple sites: Secondary | ICD-10-CM

## 2022-02-21 DIAGNOSIS — Z87448 Personal history of other diseases of urinary system: Secondary | ICD-10-CM

## 2022-02-21 DIAGNOSIS — M170 Bilateral primary osteoarthritis of knee: Secondary | ICD-10-CM

## 2022-02-21 DIAGNOSIS — L4050 Arthropathic psoriasis, unspecified: Secondary | ICD-10-CM | POA: Diagnosis not present

## 2022-02-21 DIAGNOSIS — Z8659 Personal history of other mental and behavioral disorders: Secondary | ICD-10-CM

## 2022-02-21 DIAGNOSIS — Z8639 Personal history of other endocrine, nutritional and metabolic disease: Secondary | ICD-10-CM

## 2022-02-21 DIAGNOSIS — Z111 Encounter for screening for respiratory tuberculosis: Secondary | ICD-10-CM

## 2022-02-21 DIAGNOSIS — Z8679 Personal history of other diseases of the circulatory system: Secondary | ICD-10-CM

## 2022-02-21 NOTE — Patient Instructions (Signed)
Standing Labs We placed an order today for your standing lab work.   Please have your standing labs drawn in January and every 3 months   Please have your labs drawn 2 weeks prior to your appointment so that the provider can discuss your lab results at your appointment.  Please note that you may see your imaging and lab results in [place] before we have reviewed them. We will contact you once all results are reviewed. Please allow our office up to 72 hours to thoroughly review all of the results before contacting the office for clarification of your results.  Lab hours are:   Monday through Thursday from 8:00 am -12:30 pm and 1:00 pm-5:00 pm and Friday from 8:00 am-12:00 pm.  Please be advised, all patients with office appointments requiring lab work will take precedent over walk-in lab work.   Labs are drawn by [facility]. Please bring your co-pay at the time of your lab draw.  You may receive a bill from [facility] for your lab work.  Please note if you are on Hydroxychloroquine and and an order has been placed for a Hydroxychloroquine level, you will need to have it drawn 4 hours or more after your last dose.  If you wish to have your labs drawn at another location, please call the office 24 hours in advance so we can fax the orders.  The office is located at [address], [place], [place], [place] [address] No appointment is necessary.    If you have any questions regarding directions or hours of operation,  please call [phone].   As a reminder, please drink plenty of water prior to coming for your lab work. Thanks!  If you have signs or symptoms of an infection or start antibiotics: First, call your PCP for workup of your infection. Hold your medication through the infection, until you complete your antibiotics, and until symptoms resolve if you take the following: Injectable medication (Actemra, Benlysta, Cimzia, Cosentyx, Enbrel, Humira, Kevzara, Orencia, Remicade, Simponi,  Stelara, Taltz, Tremfya) Methotrexate Leflunomide (Arava) Mycophenolate (Cellcept) Morrie Sheldon, Olumiant, or Rinvoq  Vaccines You are taking a medication(s) that can suppress your immune system.  The following immunizations are recommended: Flu annually Covid-19  Td/Tdap (tetanus, diphtheria, pertussis) every 10 years Pneumonia (Prevnar 15 then Pneumovax 23 at least 1 year apart.  Alternatively, can take Prevnar 20 without needing additional dose) Shingrix: 2 doses from 4 weeks to 6 months apart  Please check with your PCP to make sure you are up to date.

## 2022-04-16 ENCOUNTER — Ambulatory Visit: Payer: Federal, State, Local not specified - PPO | Admitting: Dermatology

## 2022-04-29 ENCOUNTER — Other Ambulatory Visit: Payer: Self-pay | Admitting: *Deleted

## 2022-04-29 DIAGNOSIS — Z79899 Other long term (current) drug therapy: Secondary | ICD-10-CM

## 2022-04-29 DIAGNOSIS — Z111 Encounter for screening for respiratory tuberculosis: Secondary | ICD-10-CM

## 2022-04-30 NOTE — Progress Notes (Signed)
Glucose is elevated at 136.  GFR is low at 59 and stable.  CBC is normal.

## 2022-05-01 LAB — QUANTIFERON-TB GOLD PLUS
Mitogen-NIL: >10 IU/mL
NIL: 0.08 IU/mL
QuantiFERON-TB Gold Plus: NEGATIVE
TB1-NIL: 0.01 IU/mL
TB2-NIL: 0.01 IU/mL

## 2022-05-01 LAB — CBC WITH DIFFERENTIAL/PLATELET
Absolute Monocytes: 762 cells/uL (ref 200–950)
Basophils Absolute: 51 cells/uL (ref 0–200)
Basophils Relative: 0.8 %
Eosinophils Absolute: 339 cells/uL (ref 15–500)
Eosinophils Relative: 5.3 %
HCT: 39.2 % (ref 35.0–45.0)
Hemoglobin: 13.4 g/dL (ref 11.7–15.5)
Lymphs Abs: 1997 cells/uL (ref 850–3900)
MCH: 29.1 pg (ref 27.0–33.0)
MCHC: 34.2 g/dL (ref 32.0–36.0)
MCV: 85 fL (ref 80.0–100.0)
MPV: 10.8 fL (ref 7.5–12.5)
Monocytes Relative: 11.9 %
Neutro Abs: 3251 cells/uL (ref 1500–7800)
Neutrophils Relative %: 50.8 %
Platelets: 274 10*3/uL (ref 140–400)
RBC: 4.61 10*6/uL (ref 3.80–5.10)
RDW: 12 % (ref 11.0–15.0)
Total Lymphocyte: 31.2 %
WBC: 6.4 10*3/uL (ref 3.8–10.8)

## 2022-05-01 LAB — COMPLETE METABOLIC PANEL WITH GFR
AG Ratio: 1.3 (calc) (ref 1.0–2.5)
ALT: 17 U/L (ref 6–29)
AST: 18 U/L (ref 10–35)
Albumin: 4.1 g/dL (ref 3.6–5.1)
Alkaline phosphatase (APISO): 76 U/L (ref 37–153)
BUN/Creatinine Ratio: 17 (calc) (ref 6–22)
BUN: 17 mg/dL (ref 7–25)
CO2: 26 mmol/L (ref 20–32)
Calcium: 9.5 mg/dL (ref 8.6–10.4)
Chloride: 101 mmol/L (ref 98–110)
Creat: 1.01 mg/dL — ABNORMAL HIGH (ref 0.60–1.00)
Globulin: 3.2 g/dL (calc) (ref 1.9–3.7)
Glucose, Bld: 136 mg/dL — ABNORMAL HIGH (ref 65–99)
Potassium: 4.6 mmol/L (ref 3.5–5.3)
Sodium: 136 mmol/L (ref 135–146)
Total Bilirubin: 0.4 mg/dL (ref 0.2–1.2)
Total Protein: 7.3 g/dL (ref 6.1–8.1)
eGFR: 59 mL/min/{1.73_m2} — ABNORMAL LOW (ref >=60)

## 2022-05-01 NOTE — Progress Notes (Signed)
TB gold negative

## 2022-05-15 ENCOUNTER — Telehealth: Payer: Self-pay | Admitting: Pharmacist

## 2022-05-15 NOTE — Telephone Encounter (Signed)
Submitted a Prior Authorization RENEWAL request to [employer] for [employer] via fax. Will update once we receive a response.  Case # [id number] Phone: [phone] Fax: [phone]  Knox Saliva, PharmD, MPH, BCPS, CPP Clinical Pharmacist (Rheumatology and Pulmonology)

## 2022-05-19 NOTE — Telephone Encounter (Signed)
Received notification from [facility] regarding a prior authorization for [place]. Authorization has been APPROVED from 04/17/2022 to 11/13/2023. Approval letter sent to scan center.  Patient must continue to fill through [facility]: [phone]  Knox Saliva, PharmD, MPH, BCPS, CPP Clinical Pharmacist (Rheumatology and Pulmonology)

## 2022-06-16 ENCOUNTER — Other Ambulatory Visit: Payer: Self-pay | Admitting: Family Medicine

## 2022-06-16 DIAGNOSIS — Z0000 Encounter for general adult medical examination without abnormal findings: Secondary | ICD-10-CM

## 2022-06-19 ENCOUNTER — Ambulatory Visit: Payer: Federal, State, Local not specified - PPO

## 2022-07-16 ENCOUNTER — Other Ambulatory Visit: Payer: Self-pay | Admitting: Physician Assistant

## 2022-07-16 NOTE — Telephone Encounter (Signed)
Last Fill: 01/07/2022  Labs: 04/29/2022 Glucose is elevated at 136.  GFR is low at 59 and stable.  CBC is normal.   TB Gold: 04/29/2022 Neg    Next Visit: 08/06/2022  Last Visit: 02/21/2022  DX: Psoriatic arthritis   Current Dose per office note 02/21/2022: Cosentyx 150 mg sq q 2 weeks   Okay to refill Cosentyx?

## 2022-07-23 NOTE — Progress Notes (Signed)
Office Visit Note  Patient: Morgan Jensen             Date of Birth: Aug 05, 1950           MRN: [id number]             PCP: Shon Hale, MD Referring: Shon Hale, * Visit Date: 08/06/2022 Occupation: @GUAROCC @  Subjective:  Medication management  History of Present Illness: Morgan Jensen is a 72 y.o. female with psoriatic arthritis, psoriasis and osteoarthritis.  She continues to have some discomfort in the right SI joint intermittently.  She denies morning stiffness, joint pain or joint swelling.  She has noticed occasional rash on her lower extremities from psoriasis.  She is not using any topical agents currently.  She had no interruption in the Cosentyx dosing since the last visit.  She takes Cosentyx 150 mg subcu every 2 weeks.    Activities of Daily Living:  Patient reports morning stiffness for 0 minutes.   Patient Denies nocturnal pain.  Difficulty dressing/grooming: Denies Difficulty climbing stairs: Denies Difficulty getting out of chair: Denies Difficulty using hands for taps, buttons, cutlery, and/or writing: Denies  Review of Systems  Constitutional:  Negative for fatigue.  HENT:  Negative for mouth sores and mouth dryness.   Eyes:  Negative for dryness.  Respiratory:  Negative for shortness of breath.   Cardiovascular:  Negative for chest pain and palpitations.  Gastrointestinal:  Negative for blood in stool, constipation and diarrhea.  Endocrine: Negative for increased urination.  Genitourinary:  Negative for involuntary urination.  Musculoskeletal:  Negative for joint pain, gait problem, joint pain, joint swelling, myalgias, muscle weakness, morning stiffness, muscle tenderness and myalgias.  Skin:  Positive for rash. Negative for color change, hair loss and sensitivity to sunlight.  Allergic/Immunologic: Negative for susceptible to infections.  Neurological:  Negative for dizziness and headaches.  Hematological:  Negative for swollen  glands.  Psychiatric/Behavioral:  Negative for depressed mood and sleep disturbance. The patient is not nervous/anxious.     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   Osteoarthritis of knee 01/18/2016   Hypertension 01/18/2016   Anxiety 01/18/2016   Elevated cholesterol 01/18/2016   CKD (chronic kidney disease) 01/18/2016   Psoriatic arthritis (HCC) 10/27/2015   Other psoriasis 10/27/2015    Past Medical History:  Diagnosis Date   Anxiety 01/18/2016   Arthritis    psoriatic arthritis    CKD (chronic kidney disease) 01/18/2016   Elevated cholesterol 01/18/2016   Hypertension 01/18/2016   Osteoarthritis of knee 01/18/2016   Osteopenia    per patient, PCP ordered DEXA    Psoriasis     Family History  Problem Relation Age of Onset   Aneurysm Mother    Cancer Father        Lung Cancer   Cancer Brother        Esopheageal Cancer   Past Surgical History:  Procedure Laterality Date   BREAST EXCISIONAL BIOPSY Right 1986   BREAST SURGERY Right 1985   Benign Tumor removed    LEG SURGERY Left 2004   Fibula and Tibia Rod placement    TUBAL LIGATION  1993   Social History   Social History Narrative   Not on file   Immunization History  Administered Date(s) Administered   Influenza, High Dose Seasonal PF 01/04/2018, 11/18/2018   PFIZER(Purple Top)SARS-COV-2 Vaccination 04/05/2019, 04/26/2019, 12/13/2019  Zoster Recombinat (Shingrix) 06/24/2017, 08/26/2017     Objective: Vital Signs: BP 122/76 (BP Location: Left Arm, Patient Position: Sitting, Cuff Size: Normal)   Pulse 74   Resp 14   Ht 5\' 2"  (1.575 m)   Wt 169 lb (76.7 kg)   BMI 30.91 kg/m    Physical Exam Vitals and nursing note reviewed.  Constitutional:      Appearance: She is well-developed.  HENT:     Head: Normocephalic and atraumatic.  Eyes:     Conjunctiva/sclera: Conjunctivae normal.   Cardiovascular:     Rate and Rhythm: Normal rate and regular rhythm.     Heart sounds: Normal heart sounds.  Pulmonary:     Effort: Pulmonary effort is normal.     Breath sounds: Normal breath sounds.  Abdominal:     General: Bowel sounds are normal.     Palpations: Abdomen is soft.  Musculoskeletal:     Cervical back: Normal range of motion.  Lymphadenopathy:     Cervical: No cervical adenopathy.  Skin:    General: Skin is warm and dry.     Capillary Refill: Capillary refill takes less than 2 seconds.  Neurological:     Mental Status: She is alert and oriented to person, place, and time.  Psychiatric:        Behavior: Behavior normal.      Musculoskeletal Exam: She had limited range of motion of the cervical spine on lateral rotation.  She had mild thoracic kyphosis.  There was no tenderness over thoracic or lumbar spine.  Mild tenderness was noted over the right SI joint.  Shoulder joints, elbow joints, wrist joints, MCPs PIPs and DIPs were in good range of motion with no synovitis.  Hip joints and knee joints were in good range of motion without any warmth swelling or effusion.  There was no tenderness over ankles or MTPs.  There was no Achilles tendinitis of Planter fasciitis.  CDAI Exam: CDAI Score: -- Patient Global: --; Provider Global: -- Swollen: --; Tender: -- Joint Exam 08/06/2022   No joint exam has been documented for this visit   There is currently no information documented on the homunculus. Go to the Rheumatology activity and complete the homunculus joint exam.  Investigation: No additional findings.  Imaging: MM 3D SCREENING MAMMOGRAM BILATERAL BREAST  Result Date: 07/29/2022 CLINICAL DATA:  Screening. EXAM: DIGITAL SCREENING BILATERAL MAMMOGRAM WITH TOMOSYNTHESIS AND CAD TECHNIQUE: Bilateral screening digital craniocaudal and mediolateral oblique mammograms were obtained. Bilateral screening digital breast tomosynthesis was performed. The images were  evaluated with computer-aided detection. COMPARISON:  Previous exam(s). ACR Breast Density Category b: There are scattered areas of fibroglandular density. FINDINGS: There are no findings suspicious for malignancy. IMPRESSION: No mammographic evidence of malignancy. A result letter of this screening mammogram will be mailed directly to the patient. RECOMMENDATION: Screening mammogram in one year. (Code:SM-B-01Y) BI-RADS CATEGORY  1: Negative. Electronically Signed   By: Norva Pavlov M.D.   On: 07/29/2022 09:52    Recent Labs: Lab Results  Component Value Date   WBC 6.4 04/29/2022   HGB 13.4 04/29/2022   PLT 274 04/29/2022   NA 136 04/29/2022   K 4.6 04/29/2022   CL 101 04/29/2022   CO2 26 04/29/2022   GLUCOSE 136 (H) 04/29/2022   BUN 17 04/29/2022   CREATININE 1.01 (H) 04/29/2022   BILITOT 0.4 04/29/2022   ALKPHOS 72 10/12/2018   AST 18 04/29/2022   ALT 17 04/29/2022   PROT 7.3 04/29/2022  ALBUMIN 3.6 10/12/2018   CALCIUM 9.5 04/29/2022   GFRAA 59 (L) 06/28/2020   QFTBGOLD Negative 07/22/2016   QFTBGOLDPLUS NEGATIVE 04/29/2022    Speciality Comments: Inadequate response to Stelara, and Enbrel, methotrexate caused elevated creatinine  Procedures:  No procedures performed Allergies: Patient has no known allergies.   Assessment / Plan:     Visit Diagnoses: Psoriatic arthritis (HCC)-patient denies any joint pain or joint swelling.  She has intermittent discomfort in the left SI joint.  She denies any recent history of uveitis, Planter fasciitis or Achilles tendinitis.  She has been taking Cosentyx on a regular basis without any interruption.  She denies any side effects from Cosentyx.  Psoriasis-few psoriasis lesions were noted on her lower extremities.  High risk medication use - Cosentyx 150 mg sq q 2 weeks-started on 01/05/19.  Labs obtained on May 07, 2022 CBC and CMP were stable.  TB Gold was negative on April 29, 2022.  D/c Remicade-last infusion 11/23/18 due to  low grade fevers. - Plan: CBC with Differential/Platelet, COMPLETE METABOLIC PANEL WITH GFR today and then every 3 months.  Information regarding mineralization was placed in the wrist.  She was advised to hold Cosyntex if she develops an infection resume after the infection resolves.  Sacroiliitis (HCC)-she has intermittent discomfort in the right SI joint.  Primary osteoarthritis of both knees-she had no warmth swelling or effusion.  She denies any discomfort.  Arthropathy of facet joint -she continues to have intermittent lower back pain.  X-rays of the lumbar spine were updated on 04/15/2021 which were consistent with mild degenerative changes and facet joint arthropathy.  Osteopenia of multiple sites-02/07/22 The BMD measured at Femur Neck Left is 0.781 g/cm2 with a T-score of -1.9.  DEXA scan results were discussed with the patient.  Calcium rich diet and vitamin D was advised.  Vitamin D deficiency-right vit D was normal at 63 on March 14, 2020.  History of chronic kidney disease-creatinine is mildly elevated and stable.  History of hypertension-blood pressure was 122/76.  History of hypercholesterolemia  History of anxiety  Orders: Orders Placed This Encounter  Procedures   CBC with Differential/Platelet   COMPLETE METABOLIC PANEL WITH GFR   No orders of the defined types were placed in this encounter.    Follow-Up Instructions: Return in about 5 months (around 01/06/2023) for Psoriatic arthritis, Osteoarthritis.   Pollyann Savoy, MD  Note - This record has been created using Animal nutritionist.  Chart creation errors have been sought, but may not always  have been located. Such creation errors do not reflect on  the standard of medical care.

## 2022-07-25 ENCOUNTER — Ambulatory Visit
Admission: RE | Admit: 2022-07-25 | Discharge: 2022-07-25 | Disposition: A | Discharge status: Home / Self Care | Payer: Federal, State, Local not specified - PPO | Source: Ambulatory Visit | Attending: Family Medicine | Admitting: Family Medicine

## 2022-07-25 DIAGNOSIS — Z0000 Encounter for general adult medical examination without abnormal findings: Secondary | ICD-10-CM

## 2022-08-06 ENCOUNTER — Ambulatory Visit: Payer: Federal, State, Local not specified - PPO | Attending: Rheumatology | Admitting: Rheumatology

## 2022-08-06 ENCOUNTER — Encounter: Payer: Self-pay | Admitting: Rheumatology

## 2022-08-06 VITALS — BP 122/76 | HR 74 | Resp 14 | Ht 62.0 in | Wt 169.0 lb

## 2022-08-06 DIAGNOSIS — M170 Bilateral primary osteoarthritis of knee: Secondary | ICD-10-CM

## 2022-08-06 DIAGNOSIS — M47819 Spondylosis without myelopathy or radiculopathy, site unspecified: Secondary | ICD-10-CM

## 2022-08-06 DIAGNOSIS — L4050 Arthropathic psoriasis, unspecified: Secondary | ICD-10-CM

## 2022-08-06 DIAGNOSIS — Z87448 Personal history of other diseases of urinary system: Secondary | ICD-10-CM

## 2022-08-06 DIAGNOSIS — M461 Sacroiliitis, not elsewhere classified: Secondary | ICD-10-CM | POA: Diagnosis not present

## 2022-08-06 DIAGNOSIS — Z79899 Other long term (current) drug therapy: Secondary | ICD-10-CM

## 2022-08-06 DIAGNOSIS — L409 Psoriasis, unspecified: Secondary | ICD-10-CM | POA: Diagnosis not present

## 2022-08-06 DIAGNOSIS — M8589 Other specified disorders of bone density and structure, multiple sites: Secondary | ICD-10-CM

## 2022-08-06 DIAGNOSIS — Z8659 Personal history of other mental and behavioral disorders: Secondary | ICD-10-CM

## 2022-08-06 DIAGNOSIS — Z8639 Personal history of other endocrine, nutritional and metabolic disease: Secondary | ICD-10-CM

## 2022-08-06 DIAGNOSIS — E559 Vitamin D deficiency, unspecified: Secondary | ICD-10-CM

## 2022-08-06 DIAGNOSIS — Z8679 Personal history of other diseases of the circulatory system: Secondary | ICD-10-CM

## 2022-08-06 NOTE — Patient Instructions (Signed)
Standing Labs We placed an order today for your standing lab work.   Please have your standing labs drawn in August and every 3 months  Please have your labs drawn 2 weeks prior to your appointment so that the provider can discuss your lab results at your appointment, if possible.  Please note that you may see your imaging and lab results in MyChart before we have reviewed them. We will contact you once all results are reviewed. Please allow our office up to 72 hours to thoroughly review all of the results before contacting the office for clarification of your results.  WALK-IN LAB HOURS  Monday through Thursday from 8:00 am -12:30 pm and 1:00 pm-5:00 pm and Friday from 8:00 am-12:00 pm.  Patients with office visits requiring labs will be seen before walk-in labs.  You may encounter longer than normal wait times. Please allow additional time. Wait times may be shorter on  Monday and Thursday afternoons.  We do not book appointments for walk-in labs. We appreciate your patience and understanding with our staff.   Labs are drawn by [facility]. Please bring your co-pay at the time of your lab draw.  You may receive a bill from [facility] for your lab work.  Please note if you are on Hydroxychloroquine and and an order has been placed for a Hydroxychloroquine level,  you will need to have it drawn 4 hours or more after your last dose.  If you wish to have your labs drawn at another location, please call the office 24 hours in advance so we can fax the orders.  The office is located at [address], [address], , [place] [address]   If you have any questions regarding directions or hours of operation,  please call [phone].   As a reminder, please drink plenty of water prior to coming for your lab work. Thanks!   Vaccines You are taking a medication(s) that can suppress your immune system.  The following immunizations are recommended: Flu annually Covid-19  Td/Tdap (tetanus,  diphtheria, pertussis) every 10 years Pneumonia (Prevnar 15 then Pneumovax 23 at least 1 year apart.  Alternatively, can take Prevnar 20 without needing additional dose) Shingrix: 2 doses from 4 weeks to 6 months apart  Please check with your PCP to make sure you are up to date.   If you have signs or symptoms of an infection or start antibiotics: First, call your PCP for workup of your infection. Hold your medication through the infection, until you complete your antibiotics, and until symptoms resolve if you take the following: Injectable medication (Actemra, Benlysta, Cimzia, Cosentyx, Enbrel, Humira, Kevzara, Orencia, Remicade, Simponi, Stelara, Taltz, Tremfya) Methotrexate Leflunomide (Arava) Mycophenolate (Cellcept) Xeljanz, Olumiant, or Rinvoq  

## 2022-08-07 LAB — CBC WITH DIFFERENTIAL/PLATELET
Absolute Monocytes: 506 cells/uL (ref 200–950)
Basophils Absolute: 61 cells/uL (ref 0–200)
Basophils Relative: 1 %
Eosinophils Absolute: 189 cells/uL (ref 15–500)
Eosinophils Relative: 3.1 %
HCT: 41.7 % (ref 35.0–45.0)
Hemoglobin: 13.8 g/dL (ref 11.7–15.5)
Lymphs Abs: 2141 cells/uL (ref 850–3900)
MCH: 28.5 pg (ref 27.0–33.0)
MCHC: 33.1 g/dL (ref 32.0–36.0)
MCV: 86 fL (ref 80.0–100.0)
MPV: 10.8 fL (ref 7.5–12.5)
Monocytes Relative: 8.3 %
Neutro Abs: 3203 cells/uL (ref 1500–7800)
Neutrophils Relative %: 52.5 %
Platelets: 288 10*3/uL (ref 140–400)
RBC: 4.85 10*6/uL (ref 3.80–5.10)
RDW: 12.3 % (ref 11.0–15.0)
Total Lymphocyte: 35.1 %
WBC: 6.1 10*3/uL (ref 3.8–10.8)

## 2022-08-07 LAB — COMPLETE METABOLIC PANEL WITH GFR
AG Ratio: 1.3 (calc) (ref 1.0–2.5)
ALT: 19 U/L (ref 6–29)
AST: 21 U/L (ref 10–35)
Albumin: 4.4 g/dL (ref 3.6–5.1)
Alkaline phosphatase (APISO): 66 U/L (ref 37–153)
BUN/Creatinine Ratio: 13 (calc) (ref 6–22)
BUN: 14 mg/dL (ref 7–25)
CO2: 25 mmol/L (ref 20–32)
Calcium: 9.5 mg/dL (ref 8.6–10.4)
Chloride: 102 mmol/L (ref 98–110)
Creat: 1.04 mg/dL — ABNORMAL HIGH (ref 0.60–1.00)
Globulin: 3.3 g/dL (calc) (ref 1.9–3.7)
Glucose, Bld: 148 mg/dL — ABNORMAL HIGH (ref 65–99)
Potassium: 4.4 mmol/L (ref 3.5–5.3)
Sodium: 137 mmol/L (ref 135–146)
Total Bilirubin: 0.4 mg/dL (ref 0.2–1.2)
Total Protein: 7.7 g/dL (ref 6.1–8.1)
eGFR: 57 mL/min/{1.73_m2} — ABNORMAL LOW (ref >=60)

## 2022-08-07 NOTE — Progress Notes (Signed)
CBC is normal.  CMP shows elevated glucose and elevated creatinine which is a stable.  Please notify patient and forward results to her PCP.

## 2022-10-07 ENCOUNTER — Other Ambulatory Visit: Payer: Self-pay | Admitting: Rheumatology

## 2022-10-07 NOTE — Telephone Encounter (Signed)
Last Fill: 07/16/2022  Labs: 08/06/2022 CBC is normal.  CMP shows elevated glucose and elevated creatinine which is a stable.   TB Gold: 04/29/2022 Neg    Next Visit: 01/06/2023  Last Visit: 08/06/2022  [id number] arthritis   Current Dose per office note 08/06/2022: Cosentyx 150 mg sq q 2 weeks   Okay to refill Cosentyx?

## 2022-12-23 NOTE — Progress Notes (Unsigned)
Office Visit Note  Patient: Morgan Jensen             Date of Birth: 08-28-1950           MRN: [id number]             PCP: Shon Hale, MD Referring: Shon Hale, * Visit Date: 01/06/2023 Occupation: @GUAROCC @  Subjective:  Medication monitoring   History of Present Illness: ADLAI SOS is a 72 y.o. female with history of psoriatic arthritis and osteoarthritis. She remains on Cosentyx 150 mg sq q 2 weeks-started on 01/05/19.  She continues to tolerate Cosentyx without any side effects or injection site reactions.  She has not had any interruptions in therapy.  She denies any recent or recurrent infections.  Patient denies any recent psoriatic arthritis flares.  She denies any active psoriasis at this time.  She has not had any SI joint pain, Achilles tendinitis, or plantar fasciitis.  She has occasional discomfort in her right knee if standing for prolonged periods of time but has not needed any over-the-counter products or Voltaren gel for pain relief.  She denies any new medical conditions.   Activities of Daily Living:  Patient reports morning stiffness for 0 minute.   Patient Denies nocturnal pain.  Difficulty dressing/grooming: Denies Difficulty climbing stairs: Reports Difficulty getting out of chair: Denies Difficulty using hands for taps, buttons, cutlery, and/or writing: Denies  Review of Systems  Constitutional:  Negative for fatigue.  HENT:  Negative for mouth sores and mouth dryness.   Eyes:  Positive for dryness.  Respiratory:  Negative for shortness of breath.   Cardiovascular:  Negative for chest pain and palpitations.  Gastrointestinal:  Negative for blood in stool, constipation and diarrhea.  Endocrine: Negative for increased urination.  Genitourinary:  Negative for involuntary urination.  Musculoskeletal:  Positive for joint pain and joint pain. Negative for gait problem, joint swelling, myalgias, muscle weakness, morning stiffness, muscle  tenderness and myalgias.  Skin:  Negative for color change, rash, hair loss and sensitivity to sunlight.  Allergic/Immunologic: Negative for susceptible to infections.  Neurological:  Negative for dizziness and headaches.  Hematological:  Negative for swollen glands.  Psychiatric/Behavioral:  Positive for sleep disturbance. Negative for depressed mood. The patient is not nervous/anxious.     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   Osteoarthritis of knee 01/18/2016   Hypertension 01/18/2016   Anxiety 01/18/2016   Elevated cholesterol 01/18/2016   CKD (chronic kidney disease) 01/18/2016   Psoriatic arthritis (HCC) 10/27/2015   Other psoriasis 10/27/2015    Past Medical History:  Diagnosis Date   Anxiety 01/18/2016   Arthritis    psoriatic arthritis    CKD (chronic kidney disease) 01/18/2016   Elevated cholesterol 01/18/2016   Hypertension 01/18/2016   Osteoarthritis of knee 01/18/2016   Osteopenia    per patient, PCP ordered DEXA    Psoriasis     Family History  Problem Relation Age of Onset   Aneurysm Mother    Cancer Father        Lung Cancer   Cancer Brother        Esopheageal Cancer   Past Surgical History:  Procedure Laterality Date   BREAST EXCISIONAL BIOPSY Right 1986   BREAST SURGERY Right 1985   Benign Tumor removed    LEG SURGERY Left 2004   Fibula and Tibia Rod placement  TUBAL LIGATION  1993   Social History   Social History Narrative   Not on file   Immunization History  Administered Date(s) Administered   Influenza, High Dose Seasonal PF 01/04/2018, 11/18/2018   PFIZER(Purple Top)SARS-COV-2 Vaccination 04/05/2019, 04/26/2019, 12/13/2019   Zoster Recombinant(Shingrix) 06/24/2017, 08/26/2017     Objective: Vital Signs: BP 129/71 (BP Location: Left Arm, Patient Position: Sitting, Cuff Size: Normal)   Pulse 67   Resp 12   Ht 5\' 2"  (1.575 m)    Wt 168 lb (76.2 kg)   BMI 30.73 kg/m    Physical Exam Vitals and nursing note reviewed.  Constitutional:      Appearance: She is well-developed.  HENT:     Head: Normocephalic and atraumatic.  Eyes:     Conjunctiva/sclera: Conjunctivae normal.  Cardiovascular:     Rate and Rhythm: Normal rate and regular rhythm.     Heart sounds: Normal heart sounds.  Pulmonary:     Effort: Pulmonary effort is normal.     Breath sounds: Normal breath sounds.  Abdominal:     General: Bowel sounds are normal.     Palpations: Abdomen is soft.  Musculoskeletal:     Cervical back: Normal range of motion.  Lymphadenopathy:     Cervical: No cervical adenopathy.  Skin:    General: Skin is warm and dry.     Capillary Refill: Capillary refill takes less than 2 seconds.  Neurological:     Mental Status: She is alert and oriented to person, place, and time.  Psychiatric:        Behavior: Behavior normal.      Musculoskeletal Exam: C-spine, thoracic spine, lumbar spine have good range of motion.  No midline spinal tenderness.  No SI joint tenderness.  Shoulder joints, elbow joints, wrist joints, MCPs, PIPs, DIPs have good range of motion with no synovitis.  Complete fist formation bilaterally.  Hip joints have good range of motion with no groin pain.  Knee joints have good range of motion with no warmth or effusion.  Ankle joints have good range of motion with no tenderness or joint swelling.  No evidence of Achilles tendinitis or plantar fasciitis.  Thickening of bilateral first MTP joints.  No synovitis over MTP joints.   CDAI Exam: CDAI Score: -- Patient Global: --; Provider Global: -- Swollen: --; Tender: -- Joint Exam 01/06/2023   No joint exam has been documented for this visit   There is currently no information documented on the homunculus. Go to the Rheumatology activity and complete the homunculus joint exam.  Investigation: No additional findings.  Imaging: No results  found.  Recent Labs: Lab Results  Component Value Date   WBC 6.9 12/25/2022   HGB 13.2 12/25/2022   PLT 264 12/25/2022   NA 135 12/25/2022   K 4.4 12/25/2022   CL 102 12/25/2022   CO2 22 12/25/2022   GLUCOSE 106 (H) 12/25/2022   BUN 20 12/25/2022   CREATININE 0.94 12/25/2022   BILITOT 0.4 12/25/2022   ALKPHOS 72 10/12/2018   AST 19 12/25/2022   ALT 16 12/25/2022   PROT 7.4 12/25/2022   ALBUMIN 3.6 10/12/2018   CALCIUM 9.1 12/25/2022   GFRAA 59 (L) 06/28/2020   QFTBGOLD Negative 07/22/2016   QFTBGOLDPLUS NEGATIVE 04/29/2022    Speciality Comments: Inadequate response to Stelara, and Enbrel, methotrexate caused elevated creatinine Cosyntex started January 05, 2019  Procedures:  No procedures performed Allergies: Patient has no known allergies.   Assessment / Plan:  Visit Diagnoses: Psoriatic arthritis (HCC): She has no synovitis or dactylitis on examination today.  She has not had any signs or symptoms of a sort of arthritis.  No evidence of Achilles tendinitis or plantar fasciitis.  No SI joint tenderness upon palpation.  No active psoriasis.  She has clinically been doing well on Cosentyx 150 mg sq injections every 14 days.  She continues to tolerate Cosentyx without any side effects or injection site reactions.  She has not had any recent or recurrent infections.  She will notify us if she develops any new or worsening symptoms.  She will follow-up in the office in 5 months or sooner if needed.  Psoriasis: No active psoriasis at this time.  She has not needed to use any topical agents.  She will remain on Cosentyx as monotherapy.  High risk medication use - Cosentyx 150 mg sq q 2 weeks-started on 01/05/19. - CBC and CMP updated on 12/25/22.  Her next lab work will be due in January and every 3 months.  Standing orders for CBC and CMP placed today. TB gold negative 04/29/22.  Future order for TB Gold placed today. No recent or recurrent infections.  Discussed the  importance of holding cosentyx if she develops signs or symptoms of an infection and to resume once the infection has completely cleared.  She is planning on receiving annual flu shot.  Plan: CBC with Differential/Platelet, COMPLETE METABOLIC PANEL WITH GFR, QuantiFERON-TB Gold Plus  Screening for tuberculosis -Future order for TB gold placed today.   plan: QuantiFERON-TB Gold Plus  Sacroiliitis (HCC): She has no SI joint tenderness upon palpation.  No nocturnal pain.  Primary osteoarthritis of both knees: She experiences intermittent discomfort in the right knee after standing for prolonged periods of time.  Her symptoms have been tolerable to the point that she has not needed any over-the-counter products or the use of Voltaren gel. On examination she has good range of motion of both knee joints with no warmth or effusion.  She will notify us if she develops any new or worsening symptoms.  Arthropathy of facet joint - X-rays of the lumbar spine were updated on 04/15/2021 which were consistent with mild degenerative changes and facet joint arthropathy.  No midline spinal tenderness.  Osteopenia of multiple sites - 02/07/22 The BMD measured at Femur Neck Left is 0.781 g/cm2 with a T-score of -1.9.  She is taking calcium and vitamin D supplement daily.   Due to update DEXA in December 2025.  Vitamin D deficiency: She is taking a daily calcium and vitamin D supplement.  Other medical conditions are listed as follows:  History of chronic kidney disease: Creatinine was 0.94 and GFR was 64 on 12/25/2022.  History of hypertension: Blood pressure was 129/71 today in the office.  History of hypercholesterolemia  History of anxiety    Orders: Orders Placed This Encounter  Procedures   CBC with Differential/Platelet   COMPLETE METABOLIC PANEL WITH GFR   QuantiFERON-TB Gold Plus   No orders of the defined types were placed in this encounter.    Follow-Up Instructions: Return in about 5  months (around 06/06/2023) for Psoriatic arthritis, Osteoarthritis.   Gearldine Bienenstock, PA-C  Note - This record has been created using Dragon software.  Chart creation errors have been sought, but may not always  have been located. Such creation errors do not reflect on  the standard of medical care.

## 2022-12-25 ENCOUNTER — Other Ambulatory Visit: Payer: Self-pay | Admitting: *Deleted

## 2022-12-25 DIAGNOSIS — Z79899 Other long term (current) drug therapy: Secondary | ICD-10-CM

## 2022-12-26 LAB — CBC WITH DIFFERENTIAL/PLATELET
Absolute Lymphocytes: 2608 {cells}/uL (ref 850–3900)
Absolute Monocytes: 559 {cells}/uL (ref 200–950)
Basophils Absolute: 48 {cells}/uL (ref 0–200)
Basophils Relative: 0.7 %
Eosinophils Absolute: 159 {cells}/uL (ref 15–500)
Eosinophils Relative: 2.3 %
HCT: 39.8 % (ref 35.0–45.0)
Hemoglobin: 13.2 g/dL (ref 11.7–15.5)
MCH: 29.2 pg (ref 27.0–33.0)
MCHC: 33.2 g/dL (ref 32.0–36.0)
MCV: 88.1 fL (ref 80.0–100.0)
MPV: 11.2 fL (ref 7.5–12.5)
Monocytes Relative: 8.1 %
Neutro Abs: 3526 {cells}/uL (ref 1500–7800)
Neutrophils Relative %: 51.1 %
Platelets: 264 10*3/uL (ref 140–400)
RBC: 4.52 10*6/uL (ref 3.80–5.10)
RDW: 12.1 % (ref 11.0–15.0)
Total Lymphocyte: 37.8 %
WBC: 6.9 10*3/uL (ref 3.8–10.8)

## 2022-12-26 LAB — COMPLETE METABOLIC PANEL WITH GFR
AG Ratio: 1.4 (calc) (ref 1.0–2.5)
ALT: 16 U/L (ref 6–29)
AST: 19 U/L (ref 10–35)
Albumin: 4.3 g/dL (ref 3.6–5.1)
Alkaline phosphatase (APISO): 65 U/L (ref 37–153)
BUN: 20 mg/dL (ref 7–25)
CO2: 22 mmol/L (ref 20–32)
Calcium: 9.1 mg/dL (ref 8.6–10.4)
Chloride: 102 mmol/L (ref 98–110)
Creat: 0.94 mg/dL (ref 0.60–1.00)
Globulin: 3.1 g/dL (ref 1.9–3.7)
Glucose, Bld: 106 mg/dL — ABNORMAL HIGH (ref 65–99)
Potassium: 4.4 mmol/L (ref 3.5–5.3)
Sodium: 135 mmol/L (ref 135–146)
Total Bilirubin: 0.4 mg/dL (ref 0.2–1.2)
Total Protein: 7.4 g/dL (ref 6.1–8.1)
eGFR: 64 mL/min/{1.73_m2} (ref >=60)

## 2022-12-26 NOTE — Progress Notes (Signed)
CBC and CMP are normal.

## 2023-01-06 ENCOUNTER — Ambulatory Visit: Payer: Federal, State, Local not specified - PPO | Attending: Physician Assistant | Admitting: Physician Assistant

## 2023-01-06 ENCOUNTER — Encounter: Payer: Self-pay | Admitting: Physician Assistant

## 2023-01-06 VITALS — BP 129/71 | HR 67 | Resp 12 | Ht 62.0 in | Wt 168.0 lb

## 2023-01-06 DIAGNOSIS — Z87448 Personal history of other diseases of urinary system: Secondary | ICD-10-CM

## 2023-01-06 DIAGNOSIS — M461 Sacroiliitis, not elsewhere classified: Secondary | ICD-10-CM | POA: Diagnosis not present

## 2023-01-06 DIAGNOSIS — Z8679 Personal history of other diseases of the circulatory system: Secondary | ICD-10-CM

## 2023-01-06 DIAGNOSIS — M8589 Other specified disorders of bone density and structure, multiple sites: Secondary | ICD-10-CM

## 2023-01-06 DIAGNOSIS — L409 Psoriasis, unspecified: Secondary | ICD-10-CM | POA: Diagnosis not present

## 2023-01-06 DIAGNOSIS — Z111 Encounter for screening for respiratory tuberculosis: Secondary | ICD-10-CM

## 2023-01-06 DIAGNOSIS — Z8639 Personal history of other endocrine, nutritional and metabolic disease: Secondary | ICD-10-CM

## 2023-01-06 DIAGNOSIS — M170 Bilateral primary osteoarthritis of knee: Secondary | ICD-10-CM

## 2023-01-06 DIAGNOSIS — L4050 Arthropathic psoriasis, unspecified: Secondary | ICD-10-CM

## 2023-01-06 DIAGNOSIS — M47819 Spondylosis without myelopathy or radiculopathy, site unspecified: Secondary | ICD-10-CM

## 2023-01-06 DIAGNOSIS — Z8659 Personal history of other mental and behavioral disorders: Secondary | ICD-10-CM

## 2023-01-06 DIAGNOSIS — Z79899 Other long term (current) drug therapy: Secondary | ICD-10-CM | POA: Diagnosis not present

## 2023-01-06 DIAGNOSIS — E559 Vitamin D deficiency, unspecified: Secondary | ICD-10-CM

## 2023-01-06 NOTE — Patient Instructions (Signed)

## 2023-01-13 ENCOUNTER — Other Ambulatory Visit: Payer: Self-pay | Admitting: Physician Assistant

## 2023-01-13 NOTE — Telephone Encounter (Signed)
Last Fill: 10/07/2022  Labs: 12/25/2022 CBC and CMP are normal.   TB Gold: 04/29/2022 Neg    Next Visit: 06/11/2023  Last Visit: 01/06/2023  [id number] arthritis   Current Dose per office note 01/06/2023: Cosentyx 150 mg sq q 2 weeks  Okay to refill Cosentyx?

## 2023-04-22 ENCOUNTER — Other Ambulatory Visit: Payer: Self-pay | Admitting: Rheumatology

## 2023-04-22 NOTE — Telephone Encounter (Signed)
Last Fill: 01/13/2023  Labs: 12/25/2022 CBC and CMP are normal.   TB Gold: 04/29/2022 Neg    Next Visit: 06/11/2023  Last Visit: 01/06/2023  [id number] arthritis   Current Dose per office note 01/06/2023: Cosentyx 150 mg sq q 2 weeks   Left message to advise patient she is due to update lab work.   Okay to refill Cosentyx?

## 2023-04-27 ENCOUNTER — Other Ambulatory Visit: Payer: Self-pay | Admitting: *Deleted

## 2023-04-27 DIAGNOSIS — Z79899 Other long term (current) drug therapy: Secondary | ICD-10-CM

## 2023-04-27 DIAGNOSIS — Z111 Encounter for screening for respiratory tuberculosis: Secondary | ICD-10-CM

## 2023-04-28 NOTE — Progress Notes (Signed)
CBC WNL Glucose is 145.  Creatinine is elevated-1.13 and GFR is low-51. Please clarify if she has been taking any NSAIDs?

## 2023-04-30 LAB — CBC WITH DIFFERENTIAL/PLATELET
Absolute Lymphocytes: 2132 {cells}/uL (ref 850–3900)
Absolute Monocytes: 546 {cells}/uL (ref 200–950)
Basophils Absolute: 33 {cells}/uL (ref 0–200)
Basophils Relative: 0.5 %
Eosinophils Absolute: 150 {cells}/uL (ref 15–500)
Eosinophils Relative: 2.3 %
HCT: 38.7 % (ref 35.0–45.0)
Hemoglobin: 13 g/dL (ref 11.7–15.5)
MCH: 29.2 pg (ref 27.0–33.0)
MCHC: 33.6 g/dL (ref 32.0–36.0)
MCV: 87 fL (ref 80.0–100.0)
MPV: 11.2 fL (ref 7.5–12.5)
Monocytes Relative: 8.4 %
Neutro Abs: 3640 {cells}/uL (ref 1500–7800)
Neutrophils Relative %: 56 %
Platelets: 298 10*3/uL (ref 140–400)
RBC: 4.45 10*6/uL (ref 3.80–5.10)
RDW: 11.7 % (ref 11.0–15.0)
Total Lymphocyte: 32.8 %
WBC: 6.5 10*3/uL (ref 3.8–10.8)

## 2023-04-30 LAB — COMPLETE METABOLIC PANEL WITH GFR
AG Ratio: 1.3 (calc) (ref 1.0–2.5)
ALT: 13 U/L (ref 6–29)
AST: 16 U/L (ref 10–35)
Albumin: 4.1 g/dL (ref 3.6–5.1)
Alkaline phosphatase (APISO): 68 U/L (ref 37–153)
BUN/Creatinine Ratio: 16 (calc) (ref 6–22)
BUN: 18 mg/dL (ref 7–25)
CO2: 27 mmol/L (ref 20–32)
Calcium: 9.5 mg/dL (ref 8.6–10.4)
Chloride: 101 mmol/L (ref 98–110)
Creat: 1.13 mg/dL — ABNORMAL HIGH (ref 0.60–1.00)
Globulin: 3.1 g/dL (ref 1.9–3.7)
Glucose, Bld: 145 mg/dL — ABNORMAL HIGH (ref 65–99)
Potassium: 4.7 mmol/L (ref 3.5–5.3)
Sodium: 137 mmol/L (ref 135–146)
Total Bilirubin: 0.4 mg/dL (ref 0.2–1.2)
Total Protein: 7.2 g/dL (ref 6.1–8.1)
eGFR: 51 mL/min/{1.73_m2} — ABNORMAL LOW (ref >=60)

## 2023-04-30 LAB — QUANTIFERON-TB GOLD PLUS
Mitogen-NIL: 7.93 [IU]/mL
NIL: 0.02 [IU]/mL
QuantiFERON-TB Gold Plus: NEGATIVE
TB1-NIL: <0 [IU]/mL
TB2-NIL: <0 [IU]/mL

## 2023-04-30 NOTE — Progress Notes (Signed)
TB Gold negative

## 2023-05-26 ENCOUNTER — Other Ambulatory Visit: Payer: Self-pay | Admitting: Physician Assistant

## 2023-05-26 NOTE — Telephone Encounter (Signed)
 Last Fill: 04/22/2023  Labs: 04/27/2023 CBC WNL Glucose is 145.  Creatinine is elevated-1.13 and GFR is low-51. Please clarify if she has been taking any NSAIDs?  TB Gold: 04/27/2023  TB Gold negative.   Next Visit: 06/11/2023  Last Visit: 01/06/2023  DX: Psoriatic arthritis   Current Dose per office note 01/06/2023: Cosentyx 150 mg sq q 2 weeks  Okay to refill Cosentyx?

## 2023-05-28 NOTE — Progress Notes (Unsigned)
 Office Visit Note  Patient: Morgan Jensen             Date of Birth: Jul 15, 1950           MRN: [id number]             PCP: Shon Hale, MD Referring: Shon Hale, * Visit Date: 06/11/2023 Occupation: @GUAROCC @  Subjective:  Medication monitoring  History of Present Illness: Morgan Jensen is a 73 y.o. female with history of psoriatic arthritis. Patient remains on  Cosentyx 150 mg sq q 2 weeks-started on 01/05/19.  She is tolerating Cosentyx without any side effects or injection site reactions.  She has not had any recent gaps in therapy.  Patient denies any signs or symptoms of a psoriatic arthritis flare.  She has no active psoriasis at this time.  She has not been experiencing any morning stiffness, nocturnal pain, or difficulty with ADLs.  She denies any Achilles tendinitis or plantar fasciitis.  She denies any SI joint pain.  Patient denies any recent falls or fractures.  She has been taking a calcium and vitamin D supplement on a daily basis.  She is aware that she is due for an updated DEXA in December 2025.  Activities of Daily Living:  Patient reports morning stiffness for 0 minutes.   Patient Denies nocturnal pain.  Difficulty dressing/grooming: Denies Difficulty climbing stairs: Denies Difficulty getting out of chair: Denies Difficulty using hands for taps, buttons, cutlery, and/or writing: Denies  Review of Systems  Constitutional:  Negative for fatigue.  HENT:  Negative for mouth sores, mouth dryness and nose dryness.   Eyes:  Negative for pain and dryness.  Respiratory:  Negative for shortness of breath and difficulty breathing.   Cardiovascular:  Negative for chest pain and palpitations.  Gastrointestinal:  Negative for blood in stool, constipation and diarrhea.  Endocrine: Negative for increased urination.  Genitourinary:  Negative for involuntary urination.  Musculoskeletal:  Negative for joint pain, gait problem, joint pain, joint swelling,  myalgias, muscle weakness, morning stiffness, muscle tenderness and myalgias.  Skin:  Negative for color change, rash, hair loss and sensitivity to sunlight.  Allergic/Immunologic: Negative for susceptible to infections.  Neurological:  Negative for dizziness and headaches.  Hematological:  Negative for swollen glands.  Psychiatric/Behavioral:  Negative for depressed mood and sleep disturbance. The patient is not nervous/anxious.     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   Osteoarthritis of knee 01/18/2016   Hypertension 01/18/2016   Anxiety 01/18/2016   Elevated cholesterol 01/18/2016   CKD (chronic kidney disease) 01/18/2016   Psoriatic arthritis (HCC) 10/27/2015   Other psoriasis 10/27/2015    Past Medical History:  Diagnosis Date   Anxiety 01/18/2016   Arthritis    psoriatic arthritis    CKD (chronic kidney disease) 01/18/2016   Elevated cholesterol 01/18/2016   Hypertension 01/18/2016   Osteoarthritis of knee 01/18/2016   Osteopenia    per patient, PCP ordered DEXA    Psoriasis     Family History  Problem Relation Age of Onset   Aneurysm Mother    Cancer Father        Lung Cancer   Cancer Brother        Esopheageal Cancer   Past Surgical History:  Procedure Laterality Date   BREAST EXCISIONAL BIOPSY Right 1986   BREAST SURGERY Right 1985   Benign Tumor removed  LEG SURGERY Left 2004   Fibula and Tibia Rod placement    TUBAL LIGATION  1993   Social History   Social History Narrative   Not on file   Immunization History  Administered Date(s) Administered   Influenza, High Dose Seasonal PF 01/04/2018, 11/18/2018   PFIZER(Purple Top)SARS-COV-2 Vaccination 04/05/2019, 04/26/2019, 12/13/2019   Zoster Recombinant(Shingrix) 06/24/2017, 08/26/2017     Objective: Vital Signs: BP 124/70 (BP Location: Left Arm, Patient Position: Sitting, Cuff Size: Normal)    Pulse 61   Resp 15   Ht 5\' 2"  (1.575 m)   Wt 163 lb 3.2 oz (74 kg)   BMI 29.85 kg/m    Physical Exam Vitals and nursing note reviewed.  Constitutional:      Appearance: She is well-developed.  HENT:     Head: Normocephalic and atraumatic.  Eyes:     Conjunctiva/sclera: Conjunctivae normal.  Cardiovascular:     Rate and Rhythm: Normal rate and regular rhythm.     Heart sounds: Normal heart sounds.  Pulmonary:     Effort: Pulmonary effort is normal.     Breath sounds: Normal breath sounds.  Abdominal:     General: Bowel sounds are normal.     Palpations: Abdomen is soft.  Musculoskeletal:     Cervical back: Normal range of motion.  Lymphadenopathy:     Cervical: No cervical adenopathy.  Skin:    General: Skin is warm and dry.     Capillary Refill: Capillary refill takes less than 2 seconds.  Neurological:     Mental Status: She is alert and oriented to person, place, and time.  Psychiatric:        Behavior: Behavior normal.      Musculoskeletal Exam: C-spine, thoracic spine, lumbar spine and good range of motion.  No midline spinal tenderness.  No SI joint tenderness.  Shoulder joints, elbow joints, wrist joints MCPs, PIPs, DIPs have good range of motion with no synovitis.  Complete fist formation bilaterally.  Hip joints have good range of motion with no groin pain.  Knee joints have good range of motion with no warmth or effusion.  Ankle joints have good range of motion with no tenderness or joint swelling.  No evidence of Achilles tendinitis or plantar fasciitis.  Thickening of bilateral first MTP joints.  Hammertoes noted.  CDAI Exam: CDAI Score: -- Patient Global: --; Provider Global: -- Swollen: --; Tender: -- Joint Exam 06/11/2023   No joint exam has been documented for this visit   There is currently no information documented on the homunculus. Go to the Rheumatology activity and complete the homunculus joint exam.  Investigation: No additional  findings.  Imaging: No results found.  Recent Labs: Lab Results  Component Value Date   WBC 6.5 04/27/2023   HGB 13.0 04/27/2023   PLT 298 04/27/2023   NA 137 04/27/2023   K 4.7 04/27/2023   CL 101 04/27/2023   CO2 27 04/27/2023   GLUCOSE 145 (H) 04/27/2023   BUN 18 04/27/2023   CREATININE 1.13 (H) 04/27/2023   BILITOT 0.4 04/27/2023   ALKPHOS 72 10/12/2018   AST 16 04/27/2023   ALT 13 04/27/2023   PROT 7.2 04/27/2023   ALBUMIN 3.6 10/12/2018   CALCIUM 9.5 04/27/2023   GFRAA 59 (L) 06/28/2020   QFTBGOLD Negative 07/22/2016   QFTBGOLDPLUS NEGATIVE 04/27/2023    Speciality Comments: Inadequate response to Stelara, and Enbrel, methotrexate caused elevated creatinine Cosyntex started January 05, 2019  Procedures:  No procedures performed Allergies:  Patient has no known allergies.   Assessment / Plan:     Visit Diagnoses: Psoriatic arthritis (HCC): She has no synovitis or dactylitis on examination today.  No SI joint tenderness upon palpation.  No evidence of Achilles tendinitis or plantar fasciitis.  She has not been experiencing any morning stiffness, nocturnal pain, or difficulty with ADLs.  No active psoriasis at this time.  She has clinically been doing well on Cosentyx 150 mg subcutaneous injections every 14 days.  She is tolerating Cosentyx without any side effects and has not had any recent gaps in therapy.  No recent or recurrent infections.  She will remain on Cosentyx as monotherapy.  She was advised to notify us if she develops signs or symptoms of a flare.  She will follow-up in the office in 5 months or sooner if needed.  Psoriasis: No active psoriasis at this time.  She will remain on Cosentyx as prescribed.  High risk medication use - Cosentyx 150 mg sq injection every 2 weeks-started on 01/05/19.  CBC and CMP updated on 04/27/23. Her next lab work will be due in May and every 3 months.  TB gold negative 04/27/23.   No recent or recurrent infections.  Discussed  the importance of holding cosentyx if she develops signs or symptoms of an infection and to resume once the infection has completely cleared.  No new medical conditions.  - Plan: CBC with Differential/Platelet, Comprehensive metabolic panel with GFR  Sacroiliitis (HCC): No SI joint tenderness at this time.  No nocturnal pain.   Primary osteoarthritis of both knees: She has good range of motion of both knee joints on examination today.  No warmth or effusion noted.  Arthropathy of facet joint - X-rays of the lumbar spine were updated on 04/15/2021 which were consistent with mild degenerative changes and facet joint arthropathy.  She is not experiencing any increased discomfort in her lower back at this time.  No midline spinal tenderness.  No SI joint tenderness.  Osteopenia of multiple sites - 02/07/22 The BMD LFN 0.781 g/cm2 with a T-score of -1.9.  She is taking calcium and vitamin D supplement daily.  No recent falls or fractures.  Due to update DEXA in December 2025.  Vitamin D deficiency: She is taking a calcium and vitamin D supplement daily.  History of chronic kidney disease: Creatinine was 1.13 and GFR was 51 on 04/27/2023.  She has been avoiding the use of NSAIDs including the use of Voltaren gel.   Other medical conditions are listed as follows:  History of hypertension: Blood pressure was 124/70 today in the office.  History of hypercholesterolemia  History of anxiety  Orders: Orders Placed This Encounter  Procedures   CBC with Differential/Platelet   Comprehensive metabolic panel with GFR   No orders of the defined types were placed in this encounter.   Follow-Up Instructions: Return in about 5 months (around 11/11/2023).   Gearldine Bienenstock, PA-C  Note - This record has been created using Dragon software.  Chart creation errors have been sought, but may not always  have been located. Such creation errors do not reflect on  the standard of medical care.

## 2023-06-11 ENCOUNTER — Ambulatory Visit: Payer: Federal, State, Local not specified - PPO | Attending: Rheumatology | Admitting: Physician Assistant

## 2023-06-11 ENCOUNTER — Encounter: Payer: Self-pay | Admitting: Physician Assistant

## 2023-06-11 VITALS — BP 124/70 | HR 61 | Resp 15 | Ht 62.0 in | Wt 163.2 lb

## 2023-06-11 DIAGNOSIS — L409 Psoriasis, unspecified: Secondary | ICD-10-CM | POA: Diagnosis not present

## 2023-06-11 DIAGNOSIS — L4050 Arthropathic psoriasis, unspecified: Secondary | ICD-10-CM

## 2023-06-11 DIAGNOSIS — Z79899 Other long term (current) drug therapy: Secondary | ICD-10-CM | POA: Diagnosis not present

## 2023-06-11 DIAGNOSIS — M461 Sacroiliitis, not elsewhere classified: Secondary | ICD-10-CM

## 2023-06-11 DIAGNOSIS — Z8679 Personal history of other diseases of the circulatory system: Secondary | ICD-10-CM

## 2023-06-11 DIAGNOSIS — M170 Bilateral primary osteoarthritis of knee: Secondary | ICD-10-CM

## 2023-06-11 DIAGNOSIS — M47819 Spondylosis without myelopathy or radiculopathy, site unspecified: Secondary | ICD-10-CM

## 2023-06-11 DIAGNOSIS — Z8639 Personal history of other endocrine, nutritional and metabolic disease: Secondary | ICD-10-CM

## 2023-06-11 DIAGNOSIS — Z8659 Personal history of other mental and behavioral disorders: Secondary | ICD-10-CM

## 2023-06-11 DIAGNOSIS — Z87448 Personal history of other diseases of urinary system: Secondary | ICD-10-CM

## 2023-06-11 DIAGNOSIS — E559 Vitamin D deficiency, unspecified: Secondary | ICD-10-CM

## 2023-06-11 DIAGNOSIS — M8589 Other specified disorders of bone density and structure, multiple sites: Secondary | ICD-10-CM

## 2023-06-11 NOTE — Patient Instructions (Signed)
 Standing Labs We placed an order today for your standing lab work.   Please have your standing labs drawn in [place] and every 3 months   Please have your labs drawn 2 weeks prior to your appointment so that the provider can discuss your lab results at your appointment, if possible.  Please note that you [place] see your imaging and lab results in MyChart before we have reviewed them. We will contact you once all results are reviewed. Please allow our office up to 72 hours to thoroughly review all of the results before contacting the office for clarification of your results.  WALK-IN LAB HOURS  Monday through Thursday from 8:00 am -12:30 pm and 1:00 pm-5:00 pm and Friday from 8:00 am-12:00 pm.  Patients with office visits requiring labs will be seen before walk-in labs.  You [place] encounter longer than normal wait times. Please allow additional time. Wait times [place] be shorter on  Monday and Thursday afternoons.  We do not book appointments for walk-in labs. We appreciate your patience and understanding with our staff.   Labs are drawn by [facility]. Please bring your co-pay at the time of your lab draw.  You [place] receive a bill from [facility] for your lab work.  Please note if you are on Hydroxychloroquine and and an order has been placed for a Hydroxychloroquine level,  you will need to have it drawn 4 hours or more after your last dose.  If you wish to have your labs drawn at another location, please call the office 24 hours in advance so we can fax the orders.  The office is located at [address], [address], [place], [place] [address]   If you have any questions regarding directions or hours of operation,  please call [phone].   As a reminder, please drink plenty of water prior to coming for your lab work. Thanks!

## 2023-07-24 ENCOUNTER — Other Ambulatory Visit: Payer: Self-pay | Admitting: Family Medicine

## 2023-07-24 DIAGNOSIS — Z1231 Encounter for screening mammogram for malignant neoplasm of breast: Secondary | ICD-10-CM

## 2023-07-28 ENCOUNTER — Other Ambulatory Visit (HOSPITAL_BASED_OUTPATIENT_CLINIC_OR_DEPARTMENT_OTHER): Payer: Self-pay | Admitting: Family Medicine

## 2023-07-28 DIAGNOSIS — E2839 Other primary ovarian failure: Secondary | ICD-10-CM

## 2023-08-06 ENCOUNTER — Ambulatory Visit

## 2023-08-12 ENCOUNTER — Ambulatory Visit
Admission: RE | Admit: 2023-08-12 | Discharge: 2023-08-12 | Disposition: A | Discharge status: Home / Self Care | Source: Ambulatory Visit | Attending: Family Medicine

## 2023-08-12 DIAGNOSIS — Z1231 Encounter for screening mammogram for malignant neoplasm of breast: Secondary | ICD-10-CM

## 2023-08-15 ENCOUNTER — Telehealth: Admitting: Physician Assistant

## 2023-08-15 DIAGNOSIS — K047 Periapical abscess without sinus: Secondary | ICD-10-CM | POA: Diagnosis not present

## 2023-08-15 MED ORDER — AMOXICILLIN-POT CLAVULANATE 875-125 MG PO TABS: 1.0000 | ORAL_TABLET | Freq: Two times a day (BID) | ORAL | 0 refills | Status: DC

## 2023-08-15 NOTE — Progress Notes (Signed)
 Virtual Visit Consent   Morgan Jensen, you are scheduled for a virtual visit with a  provider today. Just as with appointments in the office, your consent must be obtained to participate. Your consent will be active for this visit and any virtual visit you may have with one of our providers in the next 365 days. If you have a MyChart account, a copy of this consent can be sent to you electronically.  As this is a virtual visit, video technology does not allow for your provider to perform a traditional examination. This may limit your provider's ability to fully assess your condition. If your provider identifies any concerns that need to be evaluated in person or the need to arrange testing (such as labs, EKG, etc.), we will make arrangements to do so. Although advances in technology are sophisticated, we cannot ensure that it will always work on either your end or our end. If the connection with a video visit is poor, the visit may have to be switched to a telephone visit. With either a video or telephone visit, we are not always able to ensure that we have a secure connection.  By engaging in this virtual visit, you consent to the provision of healthcare and authorize for your insurance to be billed (if applicable) for the services provided during this visit. Depending on your insurance coverage, you may receive a charge related to this service.  I need to obtain your verbal consent now. Are you willing to proceed with your visit today? Morgan Jensen has provided verbal consent on 08/15/2023 for a virtual visit (video or telephone). Hyla Maillard, [place]  Date: 08/15/2023 9:26 AM   Virtual Visit via Video Note   I, Hyla Maillard, connected with  Morgan Jensen  ([id number], Feb 07, 1951) on 08/15/23 at  9:15 AM EDT by a video-enabled telemedicine application and verified that I am speaking with the correct person using two identifiers.  Location: Patient: Virtual Visit Location  Patient: Home Provider: Virtual Visit Location Provider: Home Office   I discussed the limitations of evaluation and management by telemedicine and the availability of in person appointments. The patient expressed understanding and agreed to proceed.    History of Present Illness: Morgan Jensen is a 73 y.o. who identifies as a female who was assigned female at birth, and is being seen today for possible dental infection. Patient endorses symptoms starting 4 days ago with pain of left lower tooth and surrounding gum that started mild but has continued to progress since then. Has now noted an abscess at base of this tooth. Denies chills. Initially with low-grade fever that resolved. Able to fully open and close the jaw despite pain. Notes that pain has kept her awake at night. Has taken OTC Tylenol  to help with pain.  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   Osteoarthritis of knee 01/18/2016   Hypertension 01/18/2016   Anxiety 01/18/2016   Elevated cholesterol 01/18/2016   CKD (chronic kidney disease) 01/18/2016   Psoriatic arthritis (HCC) 10/27/2015   Other psoriasis 10/27/2015    Allergies:  Allergies  Allergen Reactions   Tree Extract Cough   Medications:  Current Outpatient Medications:    amoxicillin-clavulanate (AUGMENTIN) 875-125 MG tablet, Take 1 tablet by mouth 2 (two) times daily., Disp: 14 tablet, Rfl: 0   atorvastatin (LIPITOR) 20 MG tablet, Take 20 mg  by mouth daily., Disp: , Rfl:    buPROPion (WELLBUTRIN XL) 150 MG 24 hr tablet, Take 150 mg by mouth daily., Disp: , Rfl:    CALCIUM-VITAMIN D  PO, Take by mouth daily., Disp: , Rfl:    COSENTYX  SENSOREADY, 300 MG, 150 MG/ML SOAJ, INJECT ONE PEN SUBCUTANEOUSLY EVERY 14 DAYS. REFRIGERATE. ALLOW 15 TO 30 MINUTES AT ROOM TEMP PRIOR TO ADMINISTRATION., Disp: 6 mL, Rfl: 0   diclofenac  sodium (VOLTAREN ) 1 % GEL, Voltaren  Gel  3 grams to 3 large joints upto TID 3 TUBES with 3 refills (Patient taking differently: as needed. Voltaren  Gel 3 grams to 3 large joints upto TID 3 TUBES with 3 refills), Disp: 3 Tube, Rfl: 3   famotidine (PEPCID) 40 MG tablet, TK 1 T PO QD, Disp: , Rfl:    lisinopril (PRINIVIL,ZESTRIL) 20 MG tablet, TK 1 T PO D, Disp: , Rfl: 11   loratadine (CLARITIN) 10 MG tablet, Take 10 mg by mouth as needed., Disp: , Rfl:    metFORMIN (GLUCOPHAGE-XR) 500 MG 24 hr tablet, Take 500 mg by mouth at bedtime., Disp: , Rfl:    Multiple Vitamins-Minerals (CENTRUM SILVER PO), Take 1 tablet by mouth daily., Disp: , Rfl:    PARoxetine (PAXIL) 20 MG tablet, Take 20 mg by mouth daily., Disp: , Rfl:    Polyethyl Glycol-Propyl Glycol (SYSTANE OP), Apply to eye daily., Disp: , Rfl:   Observations/Objective: Patient is well-developed, well-nourished in no acute distress.  Resting comfortably at home.  Head is normocephalic, atraumatic.  No labored breathing. Speech is clear and coherent with logical content.  Patient is alert and oriented at baseline.   Assessment and Plan: 1. Dental infection (Primary) - amoxicillin-clavulanate (AUGMENTIN) 875-125 MG tablet; Take 1 tablet by mouth 2 (two) times daily.  Dispense: 14 tablet; Refill: 0  Supportive measures and OTC medications reviewed. Start Peroxyl mouthrinse OTC. Clove paste discussed. Avoiding NSAID due to CKD. Augmentin per orders. Follow-up with dental provider as discussed.   Follow Up Instructions: I discussed the assessment and treatment plan with the patient. The patient was provided an opportunity to ask questions and all were answered. The patient agreed with the plan and demonstrated an understanding of the instructions.  A copy of instructions were sent to the patient via MyChart unless otherwise noted below.   The patient was advised to call back or seek an in-person evaluation if the symptoms worsen or if the condition fails to improve as anticipated.     Hyla Maillard, PA-C

## 2023-08-15 NOTE — Patient Instructions (Signed)
 Georgana Kilts, thank you for joining Hyla Maillard, PA-C for today's virtual visit.  While this provider is not your primary care provider (PCP), if your PCP is located in our provider database this encounter information will be shared with them immediately following your visit.   A [facility] MyChart account gives you access to today's visit and all your visits, tests, and labs performed at [facility] " click here if you don't have a [facility] MyChart account or go to [email or web address]  Consent: (Patient) TIMARA LOMA provided verbal consent for this virtual visit at the beginning of the encounter.  Current Medications:  Current Outpatient Medications:    amoxicillin-clavulanate (AUGMENTIN) 875-125 MG tablet, Take 1 tablet by mouth 2 (two) times daily., Disp: 14 tablet, Rfl: 0   atorvastatin (LIPITOR) 20 MG tablet, Take 20 mg by mouth daily., Disp: , Rfl:    buPROPion (WELLBUTRIN XL) 150 MG 24 hr tablet, Take 150 mg by mouth daily., Disp: , Rfl:    CALCIUM-VITAMIN D  PO, Take by mouth daily., Disp: , Rfl:    COSENTYX  SENSOREADY, 300 MG, 150 MG/ML SOAJ, INJECT ONE PEN SUBCUTANEOUSLY EVERY 14 DAYS. REFRIGERATE. ALLOW 15 TO 30 MINUTES AT ROOM TEMP PRIOR TO ADMINISTRATION., Disp: 6 mL, Rfl: 0   diclofenac  sodium (VOLTAREN ) 1 % GEL, Voltaren  Gel 3 grams to 3 large joints upto TID 3 TUBES with 3 refills (Patient taking differently: as needed. Voltaren  Gel 3 grams to 3 large joints upto TID 3 TUBES with 3 refills), Disp: 3 Tube, Rfl: 3   famotidine (PEPCID) 40 MG tablet, TK 1 T PO QD, Disp: , Rfl:    lisinopril (PRINIVIL,ZESTRIL) 20 MG tablet, TK 1 T PO D, Disp: , Rfl: 11   loratadine (CLARITIN) 10 MG tablet, Take 10 mg by mouth as needed., Disp: , Rfl:    metFORMIN (GLUCOPHAGE-XR) 500 MG 24 hr tablet, Take 500 mg by mouth at bedtime., Disp: , Rfl:    Multiple Vitamins-Minerals (CENTRUM SILVER PO), Take 1 tablet by mouth daily., Disp: , Rfl:    PARoxetine (PAXIL) 20  MG tablet, Take 20 mg by mouth daily., Disp: , Rfl:    Polyethyl Glycol-Propyl Glycol (SYSTANE OP), Apply to eye daily., Disp: , Rfl:    Medications ordered in this encounter:  Meds ordered this encounter  Medications   amoxicillin-clavulanate (AUGMENTIN) 875-125 MG tablet    Sig: Take 1 tablet by mouth 2 (two) times daily.    Dispense:  14 tablet    Refill:  0    Supervising Provider:   Corine Dice [[id number]]     *If you need refills on other medications prior to your next appointment, please contact your pharmacy*  Follow-Up: Call back or seek an in-person evaluation if the symptoms worsen or if the condition fails to improve as anticipated.  [facility] [phone]  Other Instructions Please take the antibiotic as directed. Ok to continue OTC Tylenol . Continue good oral hygiene.  Clove paste as discussed.  Peroxyl mouthrinse. OTC. Follow-up with your dental provider if symptoms are not quickly resolving.   Dental Abscess  A dental abscess is an area of pus in or around a tooth. It comes from an infection. It can cause pain and other symptoms. Treatment will help with symptoms and prevent the infection from spreading. What are the causes? This condition is caused by an infection in or around the tooth. This can be from: Very bad tooth decay (cavities). A bad injury  to the tooth, such as a broken or chipped tooth. What increases the risk? The risk to get an abscess is higher in males. It is also more likely in people who: Have dental decay. Have very bad gum disease. Eat sugary snacks between meals. Use tobacco. Have diabetes. Have a weak disease-fighting system (immune system). Do not brush their teeth regularly. What are the signs or symptoms? Some mild symptoms are: Tenderness. Bad breath. Fever. A sharp, sour taste in the mouth. Pain in and around the infected tooth. Worse symptoms of this condition include: Swollen neck  glands. Chills. Pus draining around the tooth. Swelling and redness around the tooth, the mouth, or the face. Very bad pain in and around the tooth. The worst symptoms can include: Difficulty swallowing. Difficulty opening your mouth. Feeling like you may vomit or vomiting. How is this treated? This is treated by getting rid of the infection. Your dentist will discuss ways to do this, including: Antibiotic medicines. Antibacterial mouth rinse. An incision in the abscess to drain out the pus. A root canal. Removing the tooth. Follow these instructions at home: Medicines Take over-the-counter and prescription medicines only as told by your dentist. If you were prescribed an antibiotic medicine, take it as told by your dentist. Do not stop taking it even if you start to feel better. If you were prescribed a gel that has numbing medicine in it, use it exactly as told. Ask your dentist if you should avoid driving or using machines while you are taking your medicine. General instructions Rinse your mouth often with salt water. To make salt water, dissolve -1 tsp (3-6 g) of salt in 1 cup (237 mL) of warm water. Eat a soft diet while your mouth is healing. Drink enough fluid to keep your pee (urine) pale yellow. Do not apply heat to the outside of your mouth. Do not smoke or use any products that contain nicotine or tobacco. If you need help quitting, ask your dentist. Keep all follow-up visits. Prevent an abscess Brush your teeth every morning and every night. Use fluoride toothpaste. Floss your teeth each day. Get dental cleanings as often as told by your dentist. Think about getting dental sealant put on teeth that have deep holes (decay). Drink water that has fluoride in it. Most tap water has fluoride. Check the label on bottled water to see if it has fluoride in it. Drink water instead of sugary drinks. Eat healthy meals and snacks. Wear a mouth guard or face shield when you  play sports. Contact a doctor if: Your pain is worse and medicine does not help. Get help right away if: You have a fever or chills. Your symptoms suddenly get worse. You have a very bad headache. You have problems breathing or swallowing. You have trouble opening your mouth. You have swelling in your neck or close to your eye. These symptoms may be an emergency. Get help right away. Call your local emergency services (911 in the [place] Do not wait to see if the symptoms will go away. Do not drive yourself to the hospital. Summary A dental abscess is an area of pus in or around a tooth. It is caused by an infection. Treatment will help with symptoms and prevent the infection from spreading. Take over-the-counter and prescription medicines only as told by your dentist. To prevent an abscess, take good care of your teeth. Brush your teeth every morning and night. Use floss every day. Get dental cleanings as often as told  by your dentist. This information is not intended to replace advice given to you by your health care provider. Make sure you discuss any questions you have with your health care provider. Document Revised: 05/02/2020 Document Reviewed: 05/03/2020 Elsevier Patient Education  2024 Elsevier Inc.   If you have been instructed to have an in-person evaluation today at a local Urgent Care facility, please use the link below. It will take you to a list of all of our available [facility], including address, phone number and hours of operation. Please do not delay care.  [facility]  If you or a family member do not have a primary care provider, use the link below to schedule a visit and establish care. When you choose a [facility] primary care physician or advanced practice provider, you gain a long-term partner in health. Find a Primary Care Provider  Learn more about [facility]'s in-office and virtual care options:  - Get Care Now

## 2023-08-20 ENCOUNTER — Other Ambulatory Visit: Payer: Self-pay | Admitting: Rheumatology

## 2023-08-20 NOTE — Telephone Encounter (Signed)
 Last Fill: 05/26/2023  Labs: 04/27/2023 CBC WNL  Glucose is 145.  Creatinine is elevated-1.13 and GFR is low-51.   TB Gold: 04/27/2023 Neg    Next Visit: 12/01/2023  Last Visit: 06/11/2023  DX: Psoriatic arthritis   Current Dose per office note 06/11/2023: Cosentyx  150 mg sq injection every 2 weeks   Left message to advise patient she is due to update labs.   Okay to refill Cosentyx ?

## 2023-08-25 ENCOUNTER — Other Ambulatory Visit: Payer: Self-pay | Admitting: *Deleted

## 2023-08-25 DIAGNOSIS — Z79899 Other long term (current) drug therapy: Secondary | ICD-10-CM

## 2023-08-25 LAB — CBC WITH DIFFERENTIAL/PLATELET
Absolute Lymphocytes: 2352 {cells}/uL (ref 850–3900)
Absolute Monocytes: 630 {cells}/uL (ref 200–950)
Basophils Absolute: 48 {cells}/uL (ref 0–200)
Basophils Relative: 0.8 %
Eosinophils Absolute: 222 {cells}/uL (ref 15–500)
Eosinophils Relative: 3.7 %
HCT: 38 % (ref 35.0–45.0)
Hemoglobin: 12.5 g/dL (ref 11.7–15.5)
MCH: 29.3 pg (ref 27.0–33.0)
MCHC: 32.9 g/dL (ref 32.0–36.0)
MCV: 89.2 fL (ref 80.0–100.0)
MPV: 11.2 fL (ref 7.5–12.5)
Monocytes Relative: 10.5 %
Neutro Abs: 2748 {cells}/uL (ref 1500–7800)
Neutrophils Relative %: 45.8 %
Platelets: 255 10*3/uL (ref 140–400)
RBC: 4.26 10*6/uL (ref 3.80–5.10)
RDW: 12.5 % (ref 11.0–15.0)
Total Lymphocyte: 39.2 %
WBC: 6 10*3/uL (ref 3.8–10.8)

## 2023-08-25 LAB — COMPREHENSIVE METABOLIC PANEL WITH GFR
AG Ratio: 1.6 (calc) (ref 1.0–2.5)
ALT: 15 U/L (ref 6–29)
AST: 19 U/L (ref 10–35)
Albumin: 4.1 g/dL (ref 3.6–5.1)
Alkaline phosphatase (APISO): 52 U/L (ref 37–153)
BUN/Creatinine Ratio: 15 (calc) (ref 6–22)
BUN: 15 mg/dL (ref 7–25)
CO2: 25 mmol/L (ref 20–32)
Calcium: 9.1 mg/dL (ref 8.6–10.4)
Chloride: 103 mmol/L (ref 98–110)
Creat: 1.02 mg/dL — ABNORMAL HIGH (ref 0.60–1.00)
Globulin: 2.5 g/dL (ref 1.9–3.7)
Glucose, Bld: 108 mg/dL — ABNORMAL HIGH (ref 65–99)
Potassium: 4.5 mmol/L (ref 3.5–5.3)
Sodium: 137 mmol/L (ref 135–146)
Total Bilirubin: 0.3 mg/dL (ref 0.2–1.2)
Total Protein: 6.6 g/dL (ref 6.1–8.1)
eGFR: 58 mL/min/{1.73_m2} — ABNORMAL LOW (ref >=60)

## 2023-08-26 ENCOUNTER — Ambulatory Visit: Payer: Self-pay | Admitting: Physician Assistant

## 2023-08-26 NOTE — Progress Notes (Signed)
 CBC WNL Creatinine remains borderline elevated but has improved-1.02 and GFR is low but has improved-58. Rest of CMP WNL

## 2023-09-10 ENCOUNTER — Other Ambulatory Visit: Payer: Self-pay | Admitting: Physician Assistant

## 2023-09-10 NOTE — Telephone Encounter (Signed)
 Last Fill: 08/20/2023 (30 day supply)  Labs: 08/25/2023 CBC WNL Creatinine remains borderline elevated but has improved-1.02 and GFR is low but has improved-58. Rest of CMP WNL  TB Gold: 04/27/2023 Neg    Next Visit: 12/01/2023  Last Visit: 06/11/2023  IK:Ednmpjupr arthritis   Current Dose per office note 06/11/2023: Cosentyx  150 mg sq injection every 2 weeks   Okay to refill Cosentyx ?

## 2023-10-25 ENCOUNTER — Ambulatory Visit
Admission: EM | Admit: 2023-10-25 | Discharge: 2023-10-25 | Disposition: A | Discharge status: Home / Self Care | Attending: Physician Assistant | Admitting: Physician Assistant

## 2023-10-25 ENCOUNTER — Encounter: Payer: Self-pay | Admitting: Emergency Medicine

## 2023-10-25 DIAGNOSIS — U071 COVID-19: Secondary | ICD-10-CM | POA: Diagnosis not present

## 2023-10-25 DIAGNOSIS — R051 Acute cough: Secondary | ICD-10-CM | POA: Diagnosis not present

## 2023-10-25 HISTORY — DX: COVID-19: U07.1

## 2023-10-25 LAB — POC SOFIA SARS ANTIGEN FIA: SARS Coronavirus 2 Ag: POSITIVE — AB

## 2023-10-25 MED ORDER — PAXLOVID (150/100) 10 X 150 MG & 10 X 100MG PO TBPK: 2.0000 | ORAL_TABLET | Freq: Two times a day (BID) | ORAL | 0 refills | Status: AC

## 2023-10-25 MED ORDER — BENZONATATE 100 MG PO CAPS: 100.0000 mg | ORAL_CAPSULE | Freq: Three times a day (TID) | ORAL | 0 refills | Status: DC

## 2023-10-25 NOTE — ED Provider Notes (Signed)
 [facility]    CSN: [id number] Arrival date & time: 10/25/23  1347      History   Chief Complaint No chief complaint on file.   HPI Morgan Jensen is a 73 y.o. female.   Patient presents today with a 2-day history of URI symptoms including fever with Tmax of 101 F, nasal congestion, nausea/vomiting, body aches, mild cough.  She denies any chest pain, shortness of breath, abdominal pain, diarrhea.  She has not been eating or drinking much today but that is because she is in the process of preparing for a colonoscopy which was scheduled to be done on Tuesday (10/27/2023).  She denies any known sick contacts.  She has had COVID several years ago but not more recently.  She did have COVID-19 vaccines but has not had most recent booster.  Denies any recent antibiotics or steroids.  She is chronically immunosuppressed due to monoclonal antibody use for psoriatic arthritis.  Denies any history of diabetes, asthma, smoking.    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      Home Medications     Prior to Admission medications   Medication Sig Start Date End Date Taking? Authorizing Provider  benzonatate  (TESSALON ) 100 MG capsule Take 1 capsule (100 mg total) by mouth every 8 (eight) hours. 10/25/23  Yes Evvie Behrmann K, PA-C  nirmatrelvir/ritonavir, renal dosing, (PAXLOVID , 150/100,) 10 x 150 MG & 10 x 100MG  TBPK Take 2 tablets by mouth 2 (two) times daily for 5 days. Dosage for moderate renal impairment (eGFR >/= 30 to <60 mL/min): 150 mg nirmatrelvir (one 150 mg tablet) with 100 mg ritonavir (one 100 mg tablet), with both tablets taken together twice daily for 5 days. Not recommended if eGFR < 30 mL/min. PAXLOVID  is not recommend in patients with severe hepatic impairment (Child-Pugh Class C). 10/25/23 10/30/23 Yes Fernando Stoiber K, PA-C  atorvastatin (LIPITOR) 20 MG tablet Take 20 mg by mouth daily.    [provider]  buPROPion (WELLBUTRIN XL) 150 MG 24 hr tablet Take 150 mg by mouth daily.    [provider]  CALCIUM-VITAMIN D  PO Take by mouth daily.    [provider]  COSENTYX  SENSOREADY, 300 MG, 150 MG/ML SOAJ INJECT 1 PEN UNDER THE SKIN EVERY 2 WEEKS 09/10/23   Cheryl Waddell HERO, PA-C  diclofenac  sodium (VOLTAREN ) 1 % GEL Voltaren  Gel 3 grams to 3 large joints upto TID 3 TUBES with 3 refills Patient taking differently: as needed. Voltaren   Gel 3 grams to 3 large joints upto TID 3 TUBES with 3 refills 01/21/16   Panwala, Naitik, PA  famotidine (PEPCID) 40 MG tablet TK 1 T PO QD 09/05/18   [provider]  lisinopril (PRINIVIL,ZESTRIL) 20 MG tablet TK 1 T PO D 12/28/16   [provider]  loratadine (CLARITIN) 10 MG tablet Take 10 mg by mouth as needed.    [provider]  metFORMIN (GLUCOPHAGE-XR) 500 MG 24 hr tablet Take 500 mg by mouth at bedtime. 01/08/22   [provider]  Multiple Vitamins-Minerals (CENTRUM SILVER PO) Take 1 tablet by mouth daily.    [provider]  PARoxetine (PAXIL) 20 MG tablet Take 20 mg by mouth  daily.    [provider]  Polyethyl Glycol-Propyl Glycol (SYSTANE OP) Apply to eye daily.    [provider]    Family History Family History  Problem Relation Age of Onset   Aneurysm Mother    Cancer Father        Lung Cancer   Cancer Brother        Esopheageal Cancer    Social History Social History   Tobacco Use   Smoking status: Former    Current packs/day: 0.00    Average packs/day: 1 pack/day for 10.0 years (10.0 ttl pk-yrs)    Types: Cigarettes    Start date: 03/10/1976    Quit date: 03/10/1986    Years since quitting: 37.6    Passive exposure: Past   Smokeless tobacco: Never  Vaping Use   Vaping status: Never Used  Substance Use Topics   Alcohol use: No   Drug use: No     Allergies   Tree extract   Review of Systems Review of Systems  Constitutional:  Positive for activity change, appetite change, fatigue and fever.  HENT:  Positive for congestion, postnasal drip and sore throat. Negative for sinus pressure and sneezing.   Respiratory:  Positive for cough. Negative for shortness of breath.   Cardiovascular:  Negative for chest pain.  Gastrointestinal:  Positive for nausea and vomiting. Negative for abdominal pain and diarrhea.  Musculoskeletal:  Positive for arthralgias and myalgias.  Neurological:  Positive for headaches. Negative for dizziness and light-headedness.     Physical Exam Triage Vital Signs ED Triage Vitals  Encounter Vitals Group     BP 10/25/23 1409 113/68     Girls Systolic BP Percentile --      Girls Diastolic BP Percentile --      Boys Systolic BP Percentile --      Boys Diastolic BP Percentile --      Pulse Rate 10/25/23 1409 80     Resp 10/25/23 1409 18     Temp 10/25/23 1409 98.8 F (37.1 C)     Temp Source 10/25/23 1409 Oral     SpO2 10/25/23 1409 95 %     Weight --      Height --      Head Circumference --      Peak Flow --      Pain Score 10/25/23 1410 6     Pain Loc --      Pain Education --       Exclude from Growth Chart --    No data found.  Updated Vital Signs BP 113/68 (BP Location: Left Arm)   Pulse 80   Temp 98.8 F (37.1 C) (Oral)   Resp 18   SpO2 95%   Visual Acuity Right Eye  Distance:   Left Eye Distance:   Bilateral Distance:    Right Eye Near:   Left Eye Near:    Bilateral Near:     Physical Exam Vitals reviewed.  Constitutional:      General: She is awake. She is not in acute distress.    Appearance: Normal appearance. She is well-developed. She is not ill-appearing.     Comments: Very pleasant female appears stated age in no acute distress sitting comfortably in exam room  HENT:     Head: Normocephalic and atraumatic.     Right Ear: Tympanic membrane, ear canal and external ear normal. Tympanic membrane is not erythematous or bulging.     Left Ear: Tympanic membrane, ear canal and external ear normal. Tympanic membrane is not erythematous or bulging.     Nose:     Right Sinus: No maxillary sinus tenderness or frontal sinus tenderness.     Left Sinus: No maxillary sinus tenderness or frontal sinus tenderness.     Mouth/Throat:     Pharynx: Uvula midline. Postnasal drip present. No oropharyngeal exudate or posterior oropharyngeal erythema.  Cardiovascular:     Rate and Rhythm: Normal rate and regular rhythm.     Heart sounds: Normal heart sounds, S1 normal and S2 normal. No murmur heard. Pulmonary:     Effort: Pulmonary effort is normal.     Breath sounds: Normal breath sounds. No wheezing, rhonchi or rales.     Comments: Clear to auscultation bilaterally Psychiatric:        Behavior: Behavior is cooperative.      UC Treatments / Results  Labs (all labs ordered are listed, but only abnormal results are displayed) Labs Reviewed  POC SOFIA SARS ANTIGEN FIA - Abnormal; Notable for the following components:      Result Value   SARS Coronavirus 2 Ag Positive (*)    All other components within normal limits    EKG   Radiology No results  found.  Procedures Procedures (including critical care time)  Medications Ordered in UC Medications - No data to display  Initial Impression / Assessment and Plan / UC Course  I have reviewed the triage vital signs and the nursing notes.  Pertinent labs & imaging results that were available during my care of the patient were reviewed by me and considered in my medical decision making (see chart for details).     Patient is well-appearing, afebrile, nontoxic, nontachycardic.  No evidence of acute infection on physical exam that warrant initiation of antibiotics.  Chest x-ray was deferred as patient had no adventitious lung sounds on exam.  She did test positive for COVID-19.  Given her age and history of immunosuppression with Cosentyx  that she is a candidate for antiviral therapy.  Will start Paxlovid  with renal based dosing given her history of chronic kidney disease with her last metabolic panel obtained 08/25/2023 with an eGFR of 58 mL/min.  She was instructed to hold her atorvastatin while on this medication for 3 days after completing course.  We did discuss that Paxlovid  can impact the metabolism of her Paxil and bupropion but there is no indication for dose adjustment.  She can use Tessalon  to help with cough.  Recommended over-the-counter medication including Mucinex, Flonase, Tylenol .  She is to monitor her oxygen saturation at home and if this is below 90% go to the emergency room.  Recommend close follow-up with her primary care.  She is to reschedule her colonoscopy.  Discussed that if anything worsens  or changes she needs to be seen immediately.  Strict return precautions given.  All questions answered to patient satisfaction.  Final Clinical Impressions(s) / UC Diagnoses   Final diagnoses:  COVID-19  Acute cough     Discharge Instructions      You tested positive for COVID.  We are starting Paxlovid  to help with your symptoms.  Hold your atorvastatin while on this medication  for 3 days after you finish your medicine.  You can use Tessalon  for cough.  I also recommend Mucinex, Tylenol , nasal saline or sinus rinses.  Obtain a pulse oximeter from the pharmacy.  Monitor your oxygen saturation if this is persistently below 93% return here for reevaluation if drops below 90% go to the emergency room.  Follow-up with your primary care first thing next week.  I do recommend you reschedule your colonoscopy.  If anything worsens and you have chest pain, shortness of breath, fever, nausea/vomiting, weakness you need to be seen immediately.     ED Prescriptions     Medication Sig Dispense Auth. Provider   benzonatate  (TESSALON ) 100 MG capsule Take 1 capsule (100 mg total) by mouth every 8 (eight) hours. 21 capsule Machaela Caterino K, PA-C   nirmatrelvir/ritonavir, renal dosing, (PAXLOVID , 150/100,) 10 x 150 MG & 10 x 100MG  TBPK Take 2 tablets by mouth 2 (two) times daily for 5 days. Dosage for moderate renal impairment (eGFR >/= 30 to <60 mL/min): 150 mg nirmatrelvir (one 150 mg tablet) with 100 mg ritonavir (one 100 mg tablet), with both tablets taken together twice daily for 5 days. Not recommended if eGFR < 30 mL/min. PAXLOVID  is not recommend in patients with severe hepatic impairment (Child-Pugh Class C). 20 tablet Ashely Joshua K, PA-C      PDMP not reviewed this encounter.   Sherrell Rocky POUR, PA-C 10/25/23 1514

## 2023-10-25 NOTE — Discharge Instructions (Addendum)
 You tested positive for COVID.  We are starting Paxlovid  to help with your symptoms.  Hold your atorvastatin while on this medication for 3 days after you finish your medicine.  You can use Tessalon  for cough.  I also recommend Mucinex, Tylenol , nasal saline or sinus rinses.  Obtain a pulse oximeter from the pharmacy.  Monitor your oxygen saturation if this is persistently below 93% return here for reevaluation if drops below 90% go to the emergency room.  Follow-up with your primary care first thing next week.  I do recommend you reschedule your colonoscopy.  If anything worsens and you have chest pain, shortness of breath, fever, nausea/vomiting, weakness you need to be seen immediately.

## 2023-10-25 NOTE — ED Triage Notes (Signed)
 Fever, nasal congestion, vomiting x 1, headache, and neck soreness since Saturday.

## 2023-11-17 NOTE — Progress Notes (Signed)
 Office Visit Note  Patient: Morgan Jensen             Date of Birth: 06/10/1950           MRN: [id number]             PCP: Chrystal Lamarr GORMAN, MD Referring: Chrystal Lamarr GORMAN, * Visit Date: 12/01/2023 Occupation: @GUAROCC @  Subjective:  Medication monitoring  History of Present Illness: Morgan Jensen is a 73 y.o. female with psoriatic arthritis, osteoarthritis and osteopenia.  She returns today after her last visit in April 2025.  She denies any joint pain or joint swelling.  She denies any history of Achilles tendinitis, plantar fasciitis or uveitis.  She denies psoriasis flare.  She states lower back pain is tolerable.  She has no discomfort in her knee joints.  She is on Cosentyx  150 mg subcu every 2 weeks since October 2020.  Repeat DEXA is scheduled in December 2025.    Activities of Daily Living:  Patient reports morning stiffness for 0 minutes.   Patient Denies nocturnal pain.  Difficulty dressing/grooming: Denies Difficulty climbing stairs: Denies Difficulty getting out of chair: Denies Difficulty using hands for taps, buttons, cutlery, and/or writing: Denies  Review of Systems  Constitutional:  Positive for fatigue.  HENT:  Negative for mouth sores and mouth dryness.   Eyes:  Negative for dryness.  Respiratory:  Negative for shortness of breath.   Cardiovascular:  Negative for chest pain and palpitations.  Gastrointestinal:  Negative for blood in stool, constipation and diarrhea.  Endocrine: Negative for increased urination.  Genitourinary:  Negative for involuntary urination.  Musculoskeletal:  Negative for joint pain, gait problem, joint pain, joint swelling, myalgias, muscle weakness, morning stiffness, muscle tenderness and myalgias.  Skin:  Negative for color change, rash, hair loss and sensitivity to sunlight.  Allergic/Immunologic: Negative for susceptible to infections.  Neurological:  Negative for dizziness and headaches.  Hematological:  Negative for  swollen glands.  Psychiatric/Behavioral:  Positive for sleep disturbance. Negative for depressed mood. The patient is not nervous/anxious.     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   Osteoarthritis of knee 01/18/2016   Hypertension 01/18/2016   Anxiety 01/18/2016   Elevated cholesterol 01/18/2016   CKD (chronic kidney disease) 01/18/2016   Psoriatic arthritis (HCC) 10/27/2015   Other psoriasis 10/27/2015    Past Medical History:  Diagnosis Date   Anxiety 01/18/2016   Arthritis    psoriatic arthritis    CKD (chronic kidney disease) 01/18/2016   COVID-19 10/25/2023   Elevated cholesterol 01/18/2016   Hypertension 01/18/2016   Osteoarthritis of knee 01/18/2016   Osteopenia    per patient, PCP ordered DEXA    Psoriasis     Family History  Problem Relation Age of Onset   Aneurysm Mother    Cancer Father        Lung Cancer   Cancer Brother        Esopheageal Cancer   Past Surgical History:  Procedure Laterality Date   BREAST EXCISIONAL BIOPSY Right 1986   BREAST SURGERY Right 1985   Benign Tumor removed    LEG SURGERY Left 2004   Fibula and Tibia Rod placement    TUBAL LIGATION  1993   Social History   Tobacco Use   Smoking status: Former    Current packs/day: 0.00    Average packs/day: 1 pack/day for 10.0  years (10.0 ttl pk-yrs)    Types: Cigarettes    Start date: 03/10/1976    Quit date: 03/10/1986    Years since quitting: 37.7    Passive exposure: Past   Smokeless tobacco: Never  Vaping Use   Vaping status: Never Used  Substance Use Topics   Alcohol use: No   Drug use: No   Social History   Social History Narrative   Not on file     Immunization History  Administered Date(s) Administered   INFLUENZA, HIGH DOSE SEASONAL PF 01/04/2018, 11/18/2018   PFIZER(Purple Top)SARS-COV-2 Vaccination 04/05/2019, 04/26/2019, 12/13/2019   Zoster  Recombinant(Shingrix) 06/24/2017, 08/26/2017     Objective: Vital Signs: BP 129/69   Pulse 69   Temp 98 F (36.7 C)   Resp 14   Ht 5' 2 (1.575 m)   Wt 155 lb (70.3 kg)   BMI 28.35 kg/m    Physical Exam Vitals and nursing note reviewed.  Constitutional:      Appearance: She is well-developed.  HENT:     Head: Normocephalic and atraumatic.  Eyes:     Conjunctiva/sclera: Conjunctivae normal.  Cardiovascular:     Rate and Rhythm: Normal rate and regular rhythm.     Heart sounds: Normal heart sounds.  Pulmonary:     Effort: Pulmonary effort is normal.     Breath sounds: Normal breath sounds.  Abdominal:     General: Bowel sounds are normal.     Palpations: Abdomen is soft.  Musculoskeletal:     Cervical back: Normal range of motion.  Lymphadenopathy:     Cervical: No cervical adenopathy.  Skin:    General: Skin is warm and dry.     Capillary Refill: Capillary refill takes less than 2 seconds.  Neurological:     Mental Status: She is alert and oriented to person, place, and time.  Psychiatric:        Behavior: Behavior normal.      Musculoskeletal Exam: She had limited lateral rotation of the cervical spine without discomfort.   Thoracic and lumbar spine were in good range of motion.  There was no SI joint tenderness.  Shoulder joints, elbow joints, wrist joints, MCPs, PIPs and DIPs were in good range of motion with no synovitis.  Hip joints and knee joints were in good range of motion without any warmth swelling or effusion.  There was no tenderness over ankles or MTPs.  Bilateral first MTP thickening was noted.   CDAI Exam: CDAI Score: -- Patient Global: --; Provider Global: -- Swollen: --; Tender: -- Joint Exam 12/01/2023   No joint exam has been documented for this visit   There is currently no information documented on the homunculus. Go to the Rheumatology activity and complete the homunculus joint exam.  Investigation: No additional  findings.  Imaging: No results found.  Recent Labs: Lab Results  Component Value Date   WBC 6.0 08/25/2023   HGB 12.5 08/25/2023   PLT 255 08/25/2023   NA 137 08/25/2023   K 4.5 08/25/2023   CL 103 08/25/2023   CO2 25 08/25/2023   GLUCOSE 108 (H) 08/25/2023   BUN 15 08/25/2023   CREATININE 1.02 (H) 08/25/2023   BILITOT 0.3 08/25/2023   ALKPHOS 72 10/12/2018   AST 19 08/25/2023   ALT 15 08/25/2023   PROT 6.6 08/25/2023   ALBUMIN 3.6 10/12/2018   CALCIUM 9.1 08/25/2023   GFRAA 59 (L) 06/28/2020   QFTBGOLD Negative 07/22/2016   QFTBGOLDPLUS NEGATIVE 04/27/2023  Speciality Comments: Inadequate response to Stelara, and Enbrel, methotrexate caused elevated creatinine Cosyntex started January 05, 2019  Procedures:  No procedures performed Allergies: Tree extract   Assessment / Plan:     Visit Diagnoses: Psoriatic arthritis (HCC)-she has been doing well on Cosentyx  150 mg subcu every other week.  She has not had a psoriasis or psoriatic arthritis flare for more than 2 years.  No synovitis was noted on the examination today.  I discussed spacing Cosentyx  to every 3 weeks if tolerated.  If she has a flare or increased discomfort she can go back to every 2 weeks to schedule.  Psoriasis-no active psoriasis lesions were noted.  High risk medication use - Cosentyx  150 mg sq injection every 2 weeks-started on 01/05/19. -CBC and CMP were normal except elevated creatinine on August 25, 2023.  Will check labs today.  Plan: CBC with Differential/Platelet, Comprehensive metabolic panel with GFR.  Information reimmunization was placed in the AVS.  She was advised to hold Cosentyx  if she develops an infection and resume after the infection resolves.  Sacroiliitis-she had no SI joint tenderness.  Primary osteoarthritis of both knees-she denies discomfort today.  No warmth swelling or effusion was noted.  Neck stiffness-she had limited range of motion of the cervical spine.  Range of motion  exercises were demonstrated in handout was placed in the AVS.  Arthropathy of facet joint -she has intermittent lower back discomfort.  She had good mobility.  She was able to reach the floor with the palm of her hands.  X-rays of the lumbar spine were updated on 04/15/2021 which were consistent with mild degenerative changes and facet joint arthropathy.  Osteopenia of multiple sites - 02/07/22 The BMD LFN 0.781 g/cm2 with a T-score of -1.9.  Repeat DEXA is scheduled in December.  Vitamin D  deficiency -she had vitamin D  deficiency in the past.  She has osteopenia.  I will check vitamin D  level today.  Plan: VITAMIN D  25 Hydroxy (Vit-D Deficiency, Fractures)  History of chronic kidney disease-creatinine has been mildly elevated and stable.  History of hypertension-blood pressure was normal at 129/69.  History of hypercholesterolemia-she is on Lipitor 20 mg p.o. daily.  Increased risk of heart disease with psoriatic arthritis was discussed.  Dietary modifications were discussed.  Need for regular exercise was emphasized.  History of anxiety  Orders: Orders Placed This Encounter  Procedures   CBC with Differential/Platelet   Comprehensive metabolic panel with GFR   VITAMIN D  25 Hydroxy (Vit-D Deficiency, Fractures)   No orders of the defined types were placed in this encounter.    Follow-Up Instructions: Return in about 5 months (around 05/02/2024) for Psoriatic arthritis.   Maya Nash, MD  Note - This record has been created using Animal nutritionist.  Chart creation errors have been sought, but may not always  have been located. Such creation errors do not reflect on  the standard of medical care.

## 2023-12-01 ENCOUNTER — Encounter: Payer: Self-pay | Admitting: Rheumatology

## 2023-12-01 ENCOUNTER — Ambulatory Visit: Attending: Rheumatology | Admitting: Rheumatology

## 2023-12-01 VITALS — BP 129/69 | HR 69 | Temp 98.0°F | Resp 14 | Ht 62.0 in | Wt 155.0 lb

## 2023-12-01 DIAGNOSIS — M461 Sacroiliitis, not elsewhere classified: Secondary | ICD-10-CM

## 2023-12-01 DIAGNOSIS — Z79899 Other long term (current) drug therapy: Secondary | ICD-10-CM

## 2023-12-01 DIAGNOSIS — L4050 Arthropathic psoriasis, unspecified: Secondary | ICD-10-CM

## 2023-12-01 DIAGNOSIS — L409 Psoriasis, unspecified: Secondary | ICD-10-CM | POA: Diagnosis not present

## 2023-12-01 DIAGNOSIS — M8589 Other specified disorders of bone density and structure, multiple sites: Secondary | ICD-10-CM

## 2023-12-01 DIAGNOSIS — M170 Bilateral primary osteoarthritis of knee: Secondary | ICD-10-CM

## 2023-12-01 DIAGNOSIS — Z87448 Personal history of other diseases of urinary system: Secondary | ICD-10-CM

## 2023-12-01 DIAGNOSIS — M436 Torticollis: Secondary | ICD-10-CM

## 2023-12-01 DIAGNOSIS — Z8659 Personal history of other mental and behavioral disorders: Secondary | ICD-10-CM

## 2023-12-01 DIAGNOSIS — Z8639 Personal history of other endocrine, nutritional and metabolic disease: Secondary | ICD-10-CM

## 2023-12-01 DIAGNOSIS — Z8679 Personal history of other diseases of the circulatory system: Secondary | ICD-10-CM

## 2023-12-01 DIAGNOSIS — M47819 Spondylosis without myelopathy or radiculopathy, site unspecified: Secondary | ICD-10-CM

## 2023-12-01 DIAGNOSIS — E559 Vitamin D deficiency, unspecified: Secondary | ICD-10-CM

## 2023-12-01 NOTE — Patient Instructions (Addendum)
 Standing Labs We placed an order today for your standing lab work.   Please have your standing labs drawn in  December and every 3 months  Please have your labs drawn 2 weeks prior to your appointment so that the provider can discuss your lab results at your appointment, if possible.  Please note that you may see your imaging and lab results in MyChart before we have reviewed them. We will contact you once all results are reviewed. Please allow our office up to 72 hours to thoroughly review all of the results before contacting the office for clarification of your results.  WALK-IN LAB HOURS  Monday through Thursday from 8:00 am -12:30 pm and 1:00 pm-4:30 pm and Friday from 8:00 am-12:00 pm.  Patients with office visits requiring labs will be seen before walk-in labs.  You may encounter longer than normal wait times. Please allow additional time. Wait times may be shorter on  Monday and Thursday afternoons.  We do not book appointments for walk-in labs. We appreciate your patience and understanding with our staff.   Labs are drawn by [facility]. Please bring your co-pay at the time of your lab draw.  You may receive a bill from [facility] for your lab work.  Please note if you are on Hydroxychloroquine and and an order has been placed for a Hydroxychloroquine level,  you will need to have it drawn 4 hours or more after your last dose.  If you wish to have your labs drawn at another location, please call the office 24 hours in advance so we can fax the orders.  The office is located at [address], [place], [place] [address]   If you have any questions regarding directions or hours of operation,  please call [phone].   As a reminder, please drink plenty of water prior to coming for your lab work. Thanks!   Vaccines You are taking a medication(s) that can suppress your immune system.  The following immunizations are recommended: Flu annually Covid-19  Td/Tdap (tetanus,  diphtheria, pertussis) every 10 years Pneumonia (Prevnar 15 then Pneumovax 23 at least 1 year apart.  Alternatively, can take Prevnar 20 without needing additional dose) Shingrix: 2 doses from 4 weeks to 6 months apart  Please check with your PCP to make sure you are up to date.   If you have signs or symptoms of an infection or start antibiotics: First, call your PCP for workup of your infection. Hold your medication through the infection, until you complete your antibiotics, and until symptoms resolve if you take the following: Injectable medication (Actemra, Benlysta, Cimzia, Cosentyx , Enbrel, Humira, Kevzara, Orencia, Remicade , Simponi, Stelara, Taltz, Tremfya) Methotrexate Leflunomide (Arava) Mycophenolate (Cellcept) Earma Jewel, or Rinvoq   Cervical Strain and Sprain Rehab Ask your health care provider which exercises are safe for you. Do exercises exactly as told by your health care provider and adjust them as directed. It is normal to feel mild stretching, pulling, tightness, or discomfort as you do these exercises. Stop right away if you feel sudden pain or your pain gets worse. Do not begin these exercises until told by your health care provider. Stretching and range-of-motion exercises Cervical side bending  Using good posture, sit on a stable chair or stand up. Without moving your shoulders, slowly tilt your left / right ear to your shoulder until you feel a stretch in the neck muscles on the opposite side. You should be looking straight ahead. Hold for __________ seconds. Repeat with the  other side of your neck. Repeat __________ times. Complete this exercise __________ times a day. Cervical rotation  Using good posture, sit on a stable chair or stand up. Slowly turn your head to the side as if you are looking over your left / right shoulder. Keep your eyes level with the ground. Stop when you feel a stretch along the side and the back of your neck. Hold for  __________ seconds. Repeat this by turning to your other side. Repeat __________ times. Complete this exercise __________ times a day. Thoracic extension and pectoral stretch  Roll a towel or a small blanket so it is about 4 inches (10 cm) in diameter. Lie down on your back on a firm surface. Put the towel in the middle of your back across your spine. It should not be under your shoulder blades. Put your hands behind your head and let your elbows fall out to your sides. Hold for __________ seconds. Repeat __________ times. Complete this exercise __________ times a day. Strengthening exercises Upper cervical flexion  Lie on your back with a thin pillow behind your head or a small, rolled-up towel under your neck. Gently tuck your chin toward your chest and nod your head down to look toward your feet. Do not lift your head off the pillow. Hold for __________ seconds. Release the tension slowly. Relax your neck muscles completely before you repeat this exercise. Repeat __________ times. Complete this exercise __________ times a day. Cervical extension  Stand about 6 inches (15 cm) away from a wall, with your back facing the wall. Place a soft object, about 6-8 inches (15-20 cm) in diameter, between the back of your head and the wall. A soft object could be a small pillow, a Mccune, or a folded towel. Gently tilt your head back and press into the soft object. Keep your jaw and forehead relaxed. Hold for __________ seconds. Release the tension slowly. Relax your neck muscles completely before you repeat this exercise. Repeat __________ times. Complete this exercise __________ times a day. Posture and body mechanics Body mechanics refer to the movements and positions of your body while you do your daily activities. Posture is part of body mechanics. Good posture and healthy body mechanics can help to relieve stress in your body's tissues and joints. Good posture means that your spine is in its  natural S-curve position (your spine is neutral), your shoulders are pulled back slightly, and your head is not tipped forward. The following are general guidelines for using improved posture and body mechanics in your everyday activities. Sitting  When sitting, keep your spine neutral and keep your feet flat on the floor. Use a footrest, if needed, and keep your thighs parallel to the floor. Avoid rounding your shoulders. Avoid tilting your head forward. When working at a desk or a computer, keep your desk at a height where your hands are slightly lower than your elbows. Slide your chair under your desk so you are close enough to maintain good posture. When working at a computer, place your monitor at a height where you are looking straight ahead and you do not have to tilt your head forward or downward to look at the screen. Standing  When standing, keep your spine neutral and keep your feet about hip-width apart. Keep a slight bend in your knees. Your ears, shoulders, and hips should line up. When you do a task in which you stand in one place for a long time, place one foot up on  a stable object that is 2-4 inches (5-10 cm) high, such as a footstool. This helps keep your spine neutral. Resting When lying down and resting, avoid positions that are most painful for you. Try to support your neck in a neutral position. You can use a contour pillow or a small rolled-up towel. Your pillow should support your neck but not push on it. This information is not intended to replace advice given to you by your health care provider. Make sure you discuss any questions you have with your health care provider. Document Revised: 06/30/2022 Document Reviewed: 09/16/2021 Elsevier Patient Education  2024 [employer].

## 2023-12-02 ENCOUNTER — Ambulatory Visit: Payer: Self-pay | Admitting: Rheumatology

## 2023-12-02 LAB — CBC WITH DIFFERENTIAL/PLATELET
Absolute Lymphocytes: 2275 {cells}/uL (ref 850–3900)
Absolute Monocytes: 686 {cells}/uL (ref 200–950)
Basophils Absolute: 63 {cells}/uL (ref 0–200)
Basophils Relative: 0.9 %
Eosinophils Absolute: 210 {cells}/uL (ref 15–500)
Eosinophils Relative: 3 %
HCT: 41 % (ref 35.0–45.0)
Hemoglobin: 13.3 g/dL (ref 11.7–15.5)
MCH: 28.9 pg (ref 27.0–33.0)
MCHC: 32.4 g/dL (ref 32.0–36.0)
MCV: 89.1 fL (ref 80.0–100.0)
MPV: 11.2 fL (ref 7.5–12.5)
Monocytes Relative: 9.8 %
Neutro Abs: 3766 {cells}/uL (ref 1500–7800)
Neutrophils Relative %: 53.8 %
Platelets: 292 Thousand/uL (ref 140–400)
RBC: 4.6 Million/uL (ref 3.80–5.10)
RDW: 12.3 % (ref 11.0–15.0)
Total Lymphocyte: 32.5 %
WBC: 7 Thousand/uL (ref 3.8–10.8)

## 2023-12-02 LAB — COMPREHENSIVE METABOLIC PANEL WITH GFR
AG Ratio: 1.5 (calc) (ref 1.0–2.5)
ALT: 15 U/L (ref 6–29)
AST: 19 U/L (ref 10–35)
Albumin: 4.4 g/dL (ref 3.6–5.1)
Alkaline phosphatase (APISO): 61 U/L (ref 37–153)
BUN/Creatinine Ratio: 17 (calc) (ref 6–22)
BUN: 18 mg/dL (ref 7–25)
CO2: 28 mmol/L (ref 20–32)
Calcium: 9.2 mg/dL (ref 8.6–10.4)
Chloride: 101 mmol/L (ref 98–110)
Creat: 1.08 mg/dL — ABNORMAL HIGH (ref 0.60–1.00)
Globulin: 2.9 g/dL (ref 1.9–3.7)
Glucose, Bld: 105 mg/dL — ABNORMAL HIGH (ref 65–99)
Potassium: 4.5 mmol/L (ref 3.5–5.3)
Sodium: 137 mmol/L (ref 135–146)
Total Bilirubin: 0.4 mg/dL (ref 0.2–1.2)
Total Protein: 7.3 g/dL (ref 6.1–8.1)
eGFR: 54 mL/min/1.73m2 — ABNORMAL LOW (ref >=60)

## 2023-12-02 LAB — VITAMIN D 25 HYDROXY (VIT D DEFICIENCY, FRACTURES): Vit D, 25-Hydroxy: 37 ng/mL (ref 30–100)

## 2023-12-02 NOTE — Progress Notes (Signed)
 CBC and CMP are stable with creatinine mildly elevated.  Vitamin D  is normal.  Please forward results to her PCP.

## 2023-12-25 ENCOUNTER — Telehealth: Payer: Self-pay

## 2023-12-25 NOTE — Telephone Encounter (Signed)
 PA renewal initiated automatically by CoverMyMeds.  Submitted a Prior Authorization request to [facility] for COSENTYX  SQ via CoverMyMeds. Will update once we receive a response.   Key: [id number]

## 2023-12-25 NOTE — Telephone Encounter (Signed)
 Received notification from [facility] regarding a prior authorization for COSENTYX  SQ. Authorization has been APPROVED from 09/26/2023 to 12/24/2024. Approval letter sent to scan center.  Authorization # [id number]

## 2024-02-10 ENCOUNTER — Other Ambulatory Visit: Payer: Self-pay | Admitting: Physician Assistant

## 2024-02-10 NOTE — Telephone Encounter (Signed)
 Last Fill: 09/10/2023  Labs: 12/01/2023 CBC and CMP are stable with creatinine mildly elevated. Vitamin D  is normal. Please forward results to her PCP.   TB Gold: 04/27/2023 TB Gold negative.  Next Visit: 05/02/2024  Last Visit: 12/01/2023  IK:Ednmpjupr arthritis (HCC)   Current Dose per office note 12/01/2023: Cosentyx  150 mg sq injection every 2 weeks, discussed spacing Cosentyx  to every 3 weeks if tolerated. If she has a flare or increased discomfort she can go back to every 2 weeks to schedule.   Okay to refill Cosentyx ?

## 2024-03-22 ENCOUNTER — Other Ambulatory Visit: Payer: Self-pay | Admitting: Rheumatology

## 2024-03-22 ENCOUNTER — Other Ambulatory Visit: Payer: Self-pay

## 2024-03-22 DIAGNOSIS — Z111 Encounter for screening for respiratory tuberculosis: Secondary | ICD-10-CM

## 2024-03-22 NOTE — Telephone Encounter (Signed)
 Last Fill: 03/08/2024 (30 day supply)   Labs: 12/01/2023 CBC and CMP are stable with creatinine mildly elevated. Vitamin D  is normal.   TB Gold: 04/27/2023 negative    Next Visit: 05/02/2024  Last Visit: 12/01/2023  IK:Ednmpjupr arthritis   Current Dose per office note on 12/01/2023: Cosentyx  150 mg sq injection every 2 weeks   Advised patient that she is due to update labs. Patient will come by the office to update CBC, CMP, and TB gold. Standing orders are in place.    Okay to refill Cosentyx ?

## 2024-03-23 ENCOUNTER — Other Ambulatory Visit: Payer: Self-pay | Admitting: *Deleted

## 2024-03-23 DIAGNOSIS — Z79899 Other long term (current) drug therapy: Secondary | ICD-10-CM

## 2024-03-23 DIAGNOSIS — Z111 Encounter for screening for respiratory tuberculosis: Secondary | ICD-10-CM

## 2024-03-24 ENCOUNTER — Ambulatory Visit: Payer: Self-pay | Admitting: Physician Assistant

## 2024-03-24 NOTE — Progress Notes (Signed)
 CBC WNL Creatinine is slightly elevated-1.04 and GFR is slightly low but has improved-56. Glucose is 111.  Rest of CMP WNL.   Avoid NSAID use

## 2024-03-25 LAB — CBC WITH DIFFERENTIAL/PLATELET
Absolute Lymphocytes: 2629 {cells}/uL (ref 850–3900)
Absolute Monocytes: 676 {cells}/uL (ref 200–950)
Basophils Absolute: 62 {cells}/uL (ref 0–200)
Basophils Relative: 0.9 %
Eosinophils Absolute: 228 {cells}/uL (ref 15–500)
Eosinophils Relative: 3.3 %
HCT: 40.4 % (ref 35.9–46.0)
Hemoglobin: 13.4 g/dL (ref 11.7–15.5)
MCH: 29.3 pg (ref 27.0–33.0)
MCHC: 33.2 g/dL (ref 31.6–35.4)
MCV: 88.2 fL (ref 81.4–101.7)
MPV: 11.1 fL (ref 7.5–12.5)
Monocytes Relative: 9.8 %
Neutro Abs: 3305 {cells}/uL (ref 1500–7800)
Neutrophils Relative %: 47.9 %
Platelets: 256 Thousand/uL (ref 140–400)
RBC: 4.58 Million/uL (ref 3.80–5.10)
RDW: 12.1 % (ref 11.0–15.0)
Total Lymphocyte: 38.1 %
WBC: 6.9 Thousand/uL (ref 3.8–10.8)

## 2024-03-25 LAB — COMPREHENSIVE METABOLIC PANEL WITH GFR
AG Ratio: 1.6 (calc) (ref 1.0–2.5)
ALT: 14 U/L (ref 6–29)
AST: 16 U/L (ref 10–35)
Albumin: 4.4 g/dL (ref 3.6–5.1)
Alkaline phosphatase (APISO): 58 U/L (ref 37–153)
BUN/Creatinine Ratio: 22 (calc) (ref 6–22)
BUN: 23 mg/dL (ref 7–25)
CO2: 26 mmol/L (ref 20–32)
Calcium: 9.2 mg/dL (ref 8.6–10.4)
Chloride: 102 mmol/L (ref 98–110)
Creat: 1.04 mg/dL — ABNORMAL HIGH (ref 0.60–1.00)
Globulin: 2.8 g/dL (ref 1.9–3.7)
Glucose, Bld: 111 mg/dL — ABNORMAL HIGH (ref 65–99)
Potassium: 4.7 mmol/L (ref 3.5–5.3)
Sodium: 137 mmol/L (ref 135–146)
Total Bilirubin: 0.4 mg/dL (ref 0.2–1.2)
Total Protein: 7.2 g/dL (ref 6.1–8.1)
eGFR: 56 mL/min/1.73m2 — ABNORMAL LOW

## 2024-03-25 LAB — QUANTIFERON-TB GOLD PLUS
Mitogen-NIL: 7.68 [IU]/mL
NIL: 0.03 [IU]/mL
QuantiFERON-TB Gold Plus: NEGATIVE
TB1-NIL: 0 [IU]/mL
TB2-NIL: 0 [IU]/mL

## 2024-03-27 ENCOUNTER — Ambulatory Visit: Payer: Self-pay | Admitting: Rheumatology

## 2024-03-27 NOTE — Progress Notes (Signed)
 TB Gold negative

## 2024-03-27 NOTE — Progress Notes (Signed)
 CBC and CMP are stable.  Creatinine remains mildly elevated.

## 2024-05-02 ENCOUNTER — Ambulatory Visit: Admitting: Physician Assistant
